# Patient Record
Sex: Male | Born: 1950 | Race: White | Hispanic: No | Marital: Married | Smoking: Never smoker
Health system: Southern US, Community
[De-identification: ages and names within clinical notes are randomized; demographics above are authoritative.]

## PROBLEM LIST (undated history)

## (undated) DIAGNOSIS — J45909 Unspecified asthma, uncomplicated: Secondary | ICD-10-CM

## (undated) DIAGNOSIS — C801 Malignant (primary) neoplasm, unspecified: Secondary | ICD-10-CM

## (undated) HISTORY — PX: BACK SURGERY: SHX140

## (undated) HISTORY — PX: TONSILLECTOMY: SUR1361

## (undated) HISTORY — PX: EYE SURGERY: SHX253

## (undated) HISTORY — PX: SKIN BIOPSY: SHX1

---

## 2001-06-20 ENCOUNTER — Encounter: Payer: Self-pay | Admitting: Surgery

## 2001-06-20 ENCOUNTER — Emergency Department (HOSPITAL_COMMUNITY): Admission: EM | Admit: 2001-06-20 | Discharge: 2001-06-20 | Payer: Self-pay | Admitting: Emergency Medicine

## 2001-08-22 ENCOUNTER — Ambulatory Visit (HOSPITAL_COMMUNITY): Admission: RE | Admit: 2001-08-22 | Discharge: 2001-08-22 | Payer: Self-pay | Admitting: Gastroenterology

## 2001-11-12 ENCOUNTER — Encounter: Payer: Self-pay | Admitting: Gastroenterology

## 2001-11-12 ENCOUNTER — Ambulatory Visit (HOSPITAL_COMMUNITY): Admission: RE | Admit: 2001-11-12 | Discharge: 2001-11-12 | Payer: Self-pay | Admitting: Gastroenterology

## 2019-08-03 ENCOUNTER — Ambulatory Visit: Payer: Medicare HMO | Attending: Internal Medicine

## 2019-08-03 DIAGNOSIS — Z23 Encounter for immunization: Secondary | ICD-10-CM | POA: Insufficient documentation

## 2019-08-03 NOTE — Progress Notes (Signed)
   Covid-19 Vaccination Clinic  Name:  Gary Young    MRN: [id number] DOB: 02/05/1951  08/03/2019  Mr. Gary Young was observed post Covid-19 immunization for 30 minutes based on pre-vaccination screening without incidence. He was provided with Vaccine Information Sheet and instruction to access the V-Safe system.   Mr. Gary Young was instructed to call 911 with any severe reactions post vaccine: [name] Kitchen Difficulty breathing  . Swelling of your face and throat  . A fast heartbeat  . A bad rash all over your body  . Dizziness and weakness    Immunizations Administered    Name Date Dose VIS Date Route   Pfizer COVID-19 Vaccine 08/03/2019 10:17 AM 0.3 mL 05/24/2019 Intramuscular   Manufacturer: [place]   Lot: [id number]   [place]: [id number]

## 2019-08-26 ENCOUNTER — Ambulatory Visit: Payer: Medicare HMO | Attending: Internal Medicine

## 2019-08-26 DIAGNOSIS — Z23 Encounter for immunization: Secondary | ICD-10-CM

## 2019-08-26 NOTE — Progress Notes (Signed)
   Covid-19 Vaccination Clinic  Name:  Gary Young    MRN: [id number] DOB: Jun 17, 1950  08/26/2019  [name] was observed post Covid-19 immunization for 15 minutes without incident. He was provided with Vaccine Information Sheet and instruction to access the V-Safe system.   [name] was instructed to call 911 with any severe reactions post vaccine: [name] Kitchen Difficulty breathing  . Swelling of face and throat  . A fast heartbeat  . A bad rash all over body  . Dizziness and weakness   Immunizations Administered    Name Date Dose VIS Date Route   Pfizer COVID-19 Vaccine 08/26/2019  8:42 AM 0.3 mL 05/24/2019 Intramuscular   Manufacturer: [place]   Lot: [id number]   [place]: [id number]

## 2020-10-13 ENCOUNTER — Other Ambulatory Visit: Payer: Self-pay

## 2020-10-13 ENCOUNTER — Encounter (HOSPITAL_BASED_OUTPATIENT_CLINIC_OR_DEPARTMENT_OTHER): Payer: Self-pay

## 2020-10-13 ENCOUNTER — Emergency Department (HOSPITAL_BASED_OUTPATIENT_CLINIC_OR_DEPARTMENT_OTHER): Payer: Medicare HMO | Admitting: Radiology

## 2020-10-13 ENCOUNTER — Emergency Department (HOSPITAL_BASED_OUTPATIENT_CLINIC_OR_DEPARTMENT_OTHER)
Admission: EM | Admit: 2020-10-13 | Discharge: 2020-10-13 | Disposition: A | Discharge status: Home / Self Care | Payer: Medicare HMO | Attending: Emergency Medicine | Admitting: Emergency Medicine

## 2020-10-13 DIAGNOSIS — M25551 Pain in right hip: Secondary | ICD-10-CM | POA: Diagnosis not present

## 2020-10-13 DIAGNOSIS — R000 Tachycardia, unspecified: Secondary | ICD-10-CM | POA: Diagnosis not present

## 2020-10-13 IMAGING — DX DG HIP (WITH OR WITHOUT PELVIS) 2-3V*R*
3 series · 3 of 3 positions shown · non-contrast
Comparison: None.

CLINICAL DATA: Right hip pain

EXAM:
DG HIP (WITH OR WITHOUT PELVIS) 2-3V RIGHT

[pelvis ap]
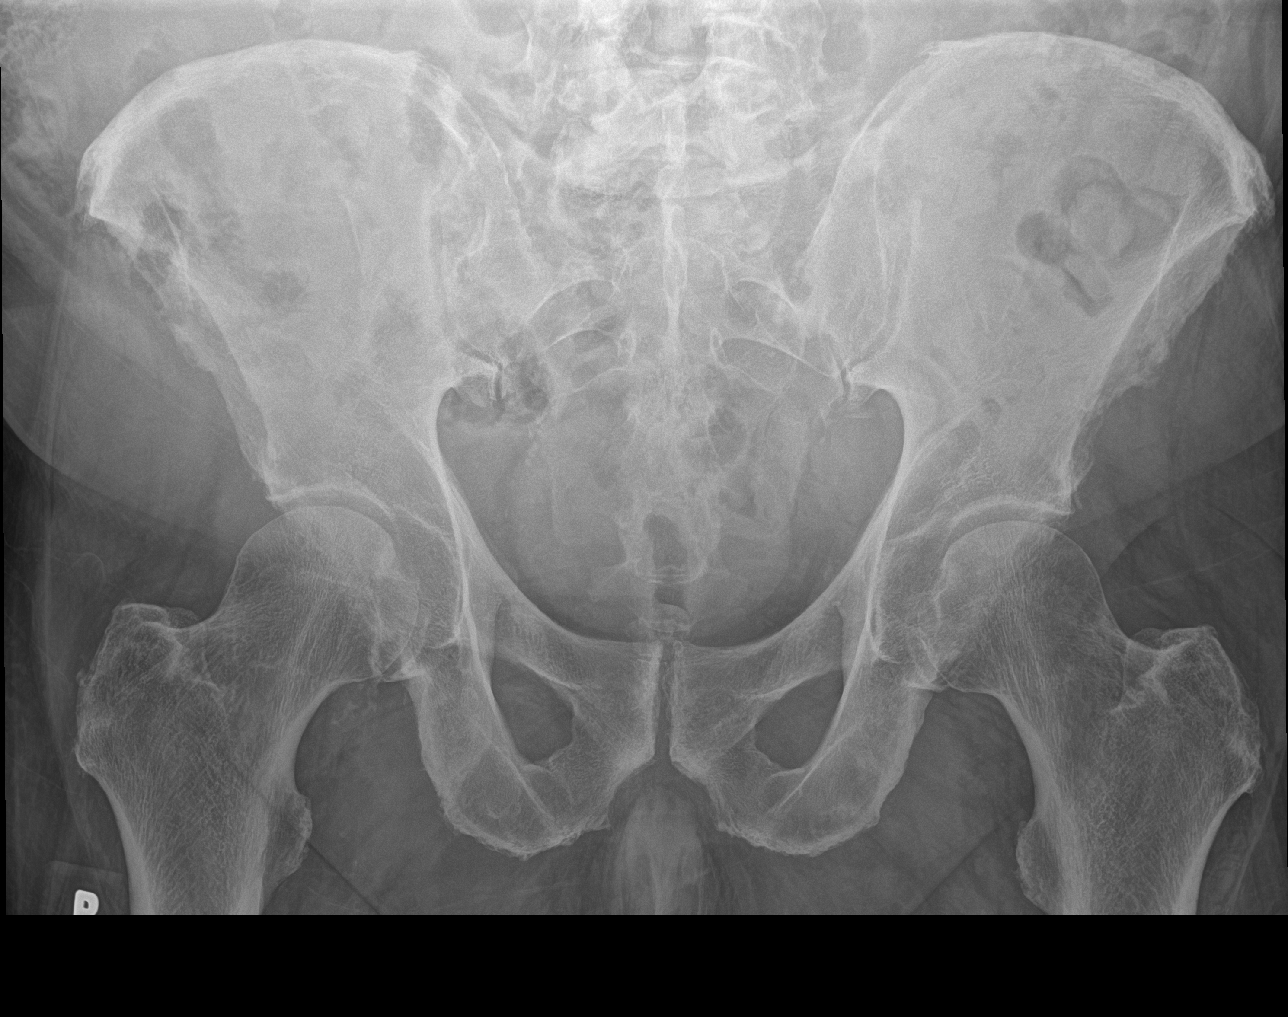

[hip ap]
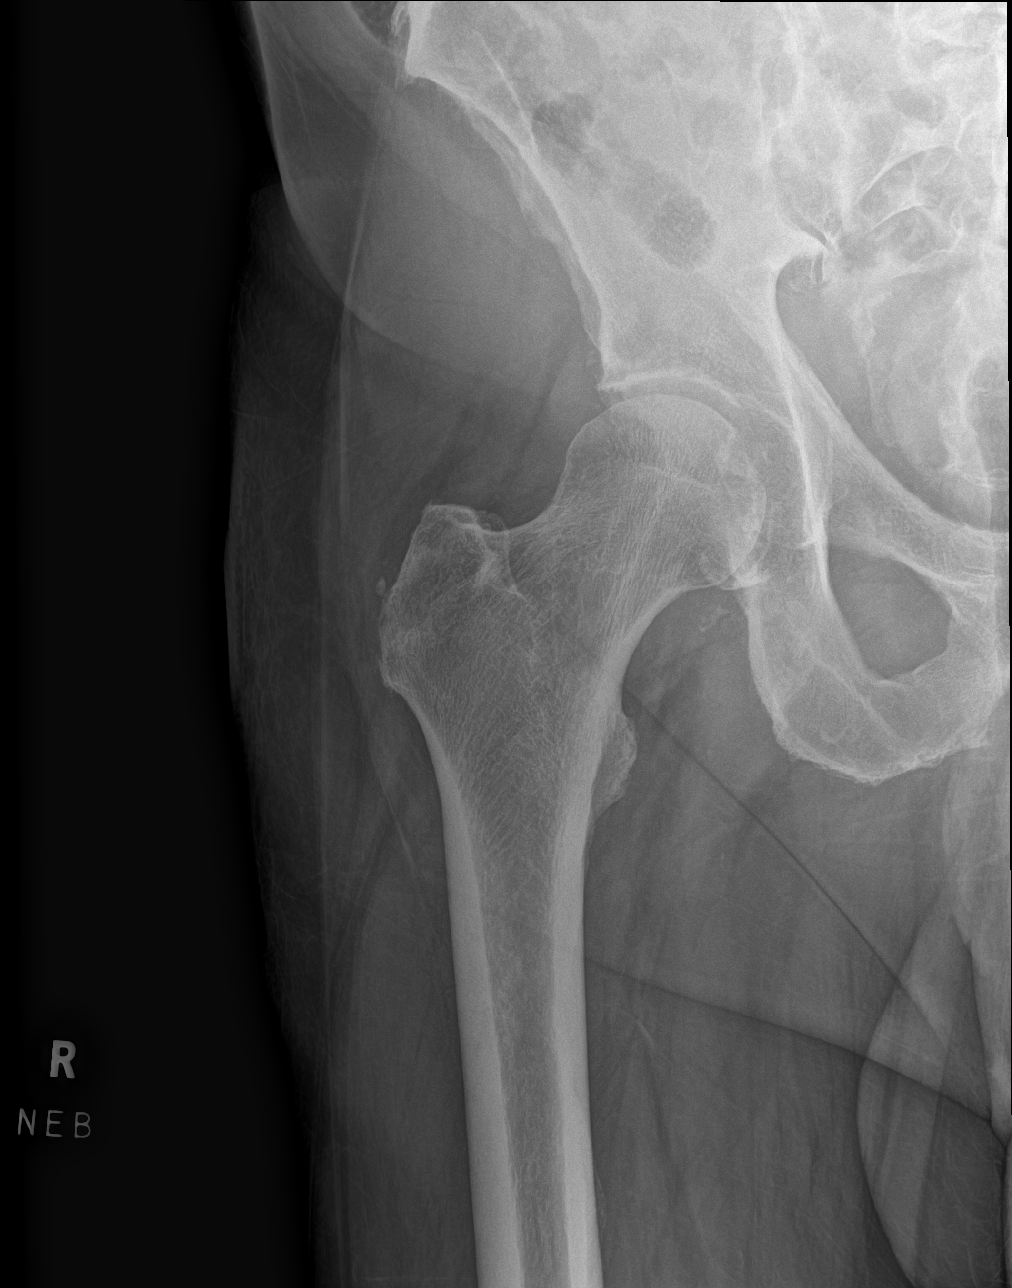

[hip lat]
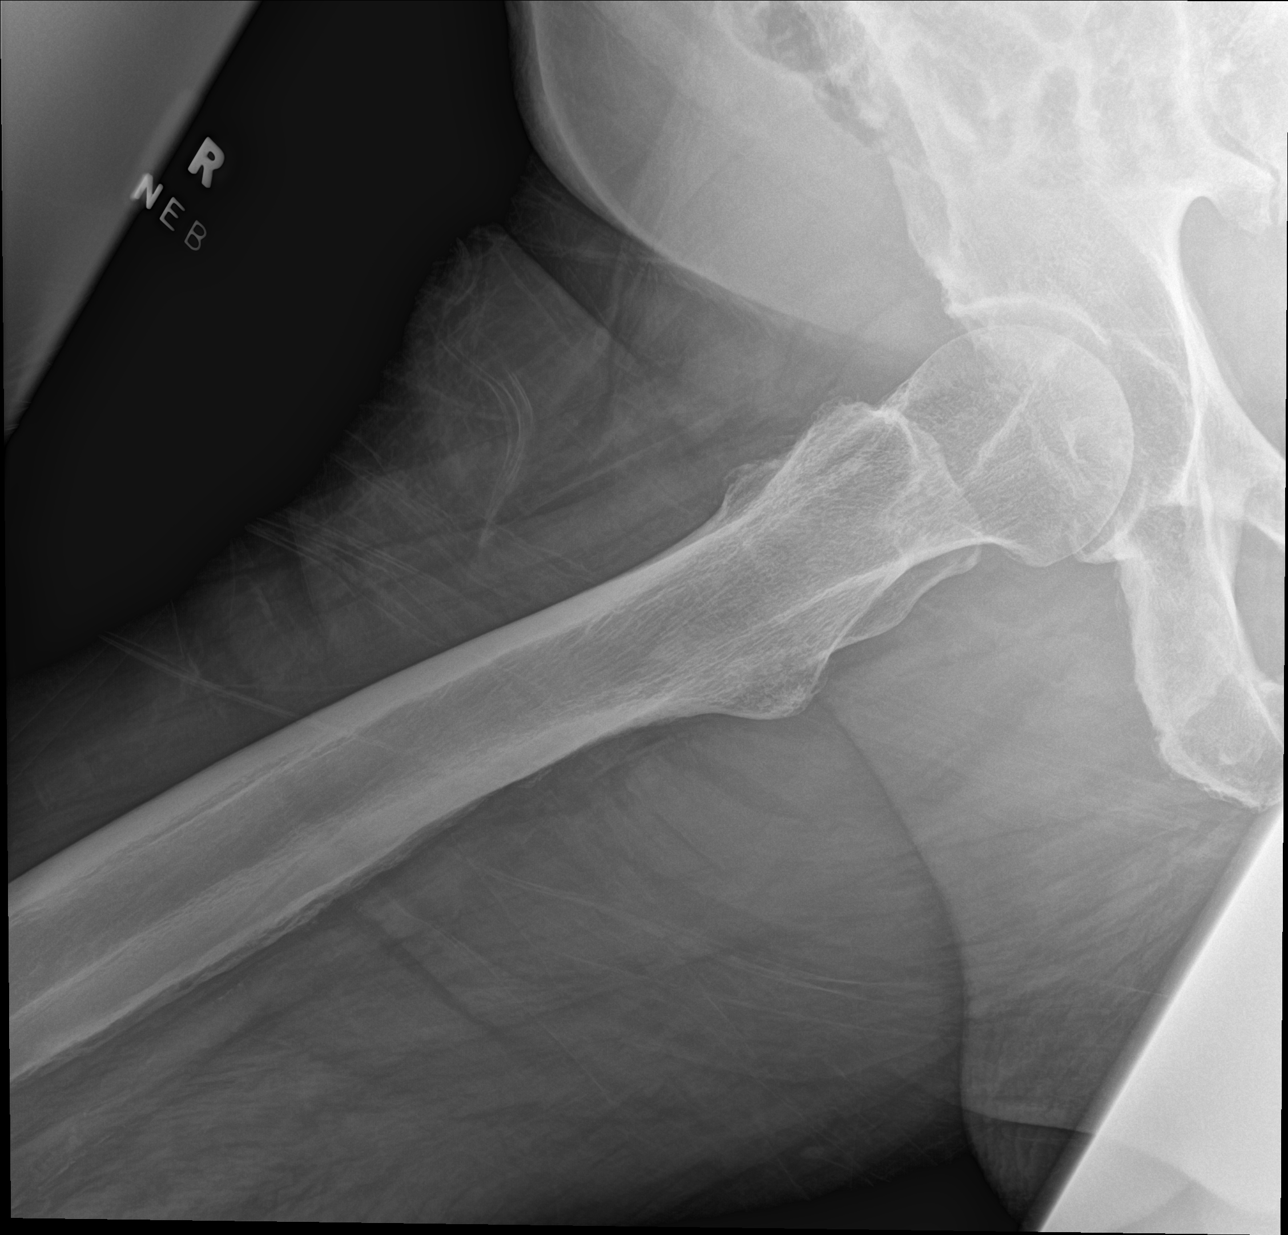

[3 of 3 positions shown; findings below may reference images not displayed]

FINDINGS: There is no evidence of hip fracture or dislocation. There is no
evidence of arthropathy or other focal bone abnormality.
IMPRESSION: Negative.

## 2020-10-13 NOTE — ED Provider Notes (Signed)
[facility] EMERGENCY DEPT Provider Note   CSN: [id number] Arrival date & time: 10/13/20  2013     History Chief Complaint  Patient presents with  . Groin Pain    Left    Breaker Springer is a 70 y.o. male.  The history is provided by the patient.  Groin Pain This is a new problem. Episode onset: 2 weeks. The problem occurs constantly. The problem has been gradually worsening. Associated symptoms comments: Pain in the right hip/groin area after bending over and cutting grass 2 weeks ago.  No abdominal pain.  No testicular pain or swelling.  No urinary problems.  No numbness that goes down the leg or weakness.  No falls.. The symptoms are aggravated by walking. The symptoms are relieved by rest and acetaminophen. He has tried acetaminophen for the symptoms. The treatment provided moderate relief.       History reviewed. No pertinent past medical history.  There are no problems to display for this patient.   History reviewed. No pertinent surgical history.     No family history on file.  Social History   Tobacco Use  . Smoking status: Never Smoker  . Smokeless tobacco: Never Used  Substance Use Topics  . Alcohol use: Yes    Comment: Socially    Home Medications Prior to Admission medications   Not on File    Allergies    Patient has no allergy information on record.  Review of Systems   Review of Systems  Hematological:       Patient had been recently diagnosed with anemia.  He reports his doctor is handling it and he is recently started taking iron.  He had noted that he had been tired all the time.  All other systems reviewed and are negative.   Physical Exam Updated Vital Signs BP 107/80 (BP Location: Left Arm)   Pulse (!) 110   Temp 98.4 F (36.9 C) (Oral)   Resp 16   Ht 6' (1.829 m)   Wt 110.7 kg   SpO2 100%   BMI 33.09 kg/m   Physical Exam Vitals and nursing note reviewed.  Constitutional:      General: He is not in acute  distress.    Appearance: He is well-developed. He is obese.  HENT:     Head: Normocephalic and atraumatic.  Eyes:     Conjunctiva/sclera: Conjunctivae normal.     Pupils: Pupils are equal, round, and reactive to light.  Cardiovascular:     Rate and Rhythm: Tachycardia present.     Heart sounds: No murmur heard.   Pulmonary:     Effort: Pulmonary effort is normal. No respiratory distress.  Abdominal:     General: There is no distension.     Palpations: Abdomen is soft.     Tenderness: There is no abdominal tenderness. There is no guarding or rebound.  Genitourinary:    Testes: Normal.  Musculoskeletal:        General: No tenderness or signs of injury. Normal range of motion.     Cervical back: Neck supple.     Right hip: Bony tenderness present. Normal range of motion.     Comments: Pain with standing on the right hip.  No reproducible pain with flexion, extension, internal or external rotation of the hip.  Skin:    General: Skin is warm and dry.     Findings: No erythema or rash.  Neurological:     Mental Status: He is alert and oriented  to person, place, and time. Mental status is at baseline.     Sensory: No sensory deficit.     Motor: No weakness.  Psychiatric:        Mood and Affect: Mood normal.        Behavior: Behavior normal.     ED Results / Procedures / Treatments   Labs (all labs ordered are listed, but only abnormal results are displayed) Labs Reviewed - No data to display  EKG None  Radiology No results found.  Procedures Procedures   Medications Ordered in ED Medications - No data to display  ED Course  I have reviewed the triage vital signs and the nursing notes.  Pertinent labs & imaging results that were available during my care of the patient were reviewed by me and considered in my medical decision making (see chart for details).    MDM Rules/Calculators/A&P                          Patient is a 70 year old male presenting today with  worsening groin pain.  He states the pain in his hip has been present for the last 2 weeks after vigorous activity doing stuff in his yard with his grass.  Symptoms do improve with Tylenol but today he went for a colonoscopy preevaluation and had to walk all over the building.  When he got home he noted a lot more pain in his right hip.  He was planning on getting an x-ray tomorrow but because of the pain worsening his wife insisted that he come to the emergency room.  Patient reports after taking the Tylenol when he got home the pain is now improved.  On exam he does have a mild limping gait but no pain reproduced with range of motion of the hip.  He has no notable swelling bulging or evidence of inguinal hernia.  He has no testicular involvement.  No signs of cellulitis and low suspicion for lumbar radiculopathy as patient is otherwise neurologically intact.  Plain film neg for acute findings.  Pt d/ced home with outpt f/u.  MDM Number of Diagnoses or Management Options   Amount and/or Complexity of Data Reviewed Tests in the radiology section of CPT: ordered and reviewed Independent visualization of images, tracings, or specimens: yes    Final Clinical Impression(s) / ED Diagnoses Final diagnoses:  Right hip pain    Rx / DC Orders ED Discharge Orders    None       Blanchie Dessert, MD 10/13/20 2159

## 2020-10-13 NOTE — Discharge Instructions (Signed)
The x-rays look normal today.  You can continue to do 2 extra strength Tylenol at the maximum 4 times a day.  You can also try Voltaren gel.

## 2020-10-13 NOTE — ED Triage Notes (Addendum)
Patient here POV from Home with L. Sided Groin Pain for approx 1 week.   Patient doesn't remember any specific Trauma then besides some mild straining while cutting grass.  Pain has been present since and has been worsening.   Ambulatory but Painful.

## 2020-10-27 ENCOUNTER — Encounter: Payer: Self-pay | Admitting: Hematology & Oncology

## 2020-10-27 ENCOUNTER — Inpatient Hospital Stay: Payer: Medicare HMO

## 2020-10-27 ENCOUNTER — Inpatient Hospital Stay (HOSPITAL_BASED_OUTPATIENT_CLINIC_OR_DEPARTMENT_OTHER): Payer: Medicare HMO | Admitting: Hematology & Oncology

## 2020-10-27 ENCOUNTER — Other Ambulatory Visit: Payer: Self-pay

## 2020-10-27 ENCOUNTER — Other Ambulatory Visit: Payer: Self-pay | Admitting: *Deleted

## 2020-10-27 ENCOUNTER — Inpatient Hospital Stay: Payer: Medicare HMO | Attending: Hematology & Oncology

## 2020-10-27 VITALS — BP 114/64 | HR 99 | Temp 98.0°F | Resp 18 | Ht 72.0 in | Wt 232.0 lb

## 2020-10-27 DIAGNOSIS — C786 Secondary malignant neoplasm of retroperitoneum and peritoneum: Secondary | ICD-10-CM | POA: Diagnosis not present

## 2020-10-27 DIAGNOSIS — R160 Hepatomegaly, not elsewhere classified: Secondary | ICD-10-CM | POA: Diagnosis not present

## 2020-10-27 DIAGNOSIS — C439 Malignant melanoma of skin, unspecified: Secondary | ICD-10-CM | POA: Insufficient documentation

## 2020-10-27 DIAGNOSIS — G893 Neoplasm related pain (acute) (chronic): Secondary | ICD-10-CM | POA: Diagnosis not present

## 2020-10-27 DIAGNOSIS — C7800 Secondary malignant neoplasm of unspecified lung: Secondary | ICD-10-CM | POA: Insufficient documentation

## 2020-10-27 DIAGNOSIS — C787 Secondary malignant neoplasm of liver and intrahepatic bile duct: Secondary | ICD-10-CM | POA: Diagnosis not present

## 2020-10-27 LAB — CBC WITH DIFFERENTIAL (CANCER CENTER ONLY)
Abs Immature Granulocytes: 0.1 10*3/uL — ABNORMAL HIGH (ref 0.00–0.07)
Basophils Absolute: 0.1 10*3/uL (ref 0.0–0.1)
Basophils Relative: 1 %
Eosinophils Absolute: 0.2 10*3/uL (ref 0.0–0.5)
Eosinophils Relative: 2 %
HCT: 30 % — ABNORMAL LOW (ref 39.0–52.0)
Hemoglobin: 9.5 g/dL — ABNORMAL LOW (ref 13.0–17.0)
Immature Granulocytes: 1 %
Lymphocytes Relative: 12 %
Lymphs Abs: 1.5 10*3/uL (ref 0.7–4.0)
MCH: 26.6 pg (ref 26.0–34.0)
MCHC: 31.7 g/dL (ref 30.0–36.0)
MCV: 84 fL (ref 80.0–100.0)
Monocytes Absolute: 1.4 10*3/uL — ABNORMAL HIGH (ref 0.1–1.0)
Monocytes Relative: 11 %
Neutro Abs: 9.2 10*3/uL — ABNORMAL HIGH (ref 1.7–7.7)
Neutrophils Relative %: 73 %
Platelet Count: 383 10*3/uL (ref 150–400)
RBC: 3.57 MIL/uL — ABNORMAL LOW (ref 4.22–5.81)
RDW: 14.5 % (ref 11.5–15.5)
WBC Count: 12.5 10*3/uL — ABNORMAL HIGH (ref 4.0–10.5)
nRBC: 0 % (ref 0.0–0.2)

## 2020-10-27 LAB — CMP (CANCER CENTER ONLY)
ALT: 16 U/L (ref 0–44)
AST: 18 U/L (ref 15–41)
Albumin: 3.9 g/dL (ref 3.5–5.0)
Alkaline Phosphatase: 97 U/L (ref 38–126)
Anion gap: 10 (ref 5–15)
BUN: 28 mg/dL — ABNORMAL HIGH (ref 8–23)
CO2: 28 mmol/L (ref 22–32)
Calcium: 9.9 mg/dL (ref 8.9–10.3)
Chloride: 94 mmol/L — ABNORMAL LOW (ref 98–111)
Creatinine: 0.86 mg/dL (ref 0.61–1.24)
GFR, Estimated: >60 mL/min (ref >60)
Glucose, Bld: 130 mg/dL — ABNORMAL HIGH (ref 70–99)
Potassium: 4.6 mmol/L (ref 3.5–5.1)
Sodium: 132 mmol/L — ABNORMAL LOW (ref 135–145)
Total Bilirubin: 0.4 mg/dL (ref 0.3–1.2)
Total Protein: 6.1 g/dL — ABNORMAL LOW (ref 6.5–8.1)

## 2020-10-27 LAB — SAVE SMEAR(SSMR), FOR PROVIDER SLIDE REVIEW

## 2020-10-27 LAB — LACTATE DEHYDROGENASE: LDH: 449 U/L — ABNORMAL HIGH (ref 98–192)

## 2020-10-27 LAB — PREALBUMIN: Prealbumin: 10.9 mg/dL — ABNORMAL LOW (ref 18–38)

## 2020-10-27 MED ORDER — HYDROXYZINE HCL 25 MG PO TABS: 25.0000 mg | ORAL_TABLET | Freq: Three times a day (TID) | ORAL | 0 refills | Status: AC | PRN

## 2020-10-27 MED ORDER — HYDROMORPHONE HCL 4 MG/ML IJ SOLN
INTRAMUSCULAR | Status: AC
  Filled 2020-10-27: qty 1

## 2020-10-27 MED ORDER — FENTANYL 25 MCG/HR TD PT72: 1.0000 | MEDICATED_PATCH | TRANSDERMAL | 0 refills | Status: DC

## 2020-10-27 MED ORDER — HYDROMORPHONE HCL 4 MG/ML IJ SOLN
2.0000 mg | Freq: Once | INTRAMUSCULAR | Status: AC
  Administered 2020-10-27: 2 mg via SUBCUTANEOUS

## 2020-10-27 MED ORDER — HYDROMORPHONE HCL 1 MG/ML IJ SOLN: 2.0000 mg | Freq: Once | INTRAMUSCULAR | Status: DC

## 2020-10-27 NOTE — Patient Instructions (Signed)
Hydromorphone injection What is this medicine? HYDROMORPHONE (hye droe MOR fone) is a pain reliever. It is used to treat moderate to severe pain. This medicine may be used for other purposes; ask your health care provider or pharmacist if you have questions. COMMON BRAND NAME(S): Dilaudid, Dilaudid-HP, Simplist Dilaudid What should I tell my health care provider before I take this medicine? They need to know if you have any of these conditions:  brain tumor  drug abuse or addiction  head injury  heart disease  if you often drink alcohol  kidney disease  liver disease  lung or breathing disease, like asthma  problems urinating  seizures  stomach or intestine problems  an unusual or allergic reaction to hydromorphone, other medicines, foods, dyes, or preservatives  pregnant or trying to get pregnant  breast-feeding How should I use this medicine? This medicine is for injection into a vein, into a muscle, or under the skin. It is usually given by a health care professional in a hospital or clinic setting. In rare cases, you might get this medicine at home. You will be taught how to give this medicine. Use exactly as directed. Take your medicine at regular intervals. Do not take your medicine more often than directed. It is important that you put your used needles and syringes in a special sharps container. Do not put them in a trash can. If you do not have a sharps container, call your pharmacist or healthcare provider to get one. Talk to your pediatrician regarding the use of this medicine in children. Special care may be needed. Overdosage: If you think you have taken too much of this medicine contact a poison control center or emergency room at once. NOTE: This medicine is only for you. Do not share this medicine with others. What if I miss a dose? If you miss a dose, use it as soon as you can. If it is almost time for your next dose, use only that dose. Do not use double  or extra doses. What may interact with this medicine? This medicine may interact with the following medications:  alcohol  antihistamines for allergy, cough and cold  certain medicines for anxiety or sleep  certain medicines for depression like amitriptyline, fluoxetine, sertraline  certain medicines for seizures like phenobarbital, primidone  general anesthetics like halothane, isoflurane, methoxyflurane, propofol  local anesthetics like lidocaine, pramoxine, tetracaine  MAOIs like Carbex, Eldepryl, Marplan, Nardil, and Parnate  medicines that relax muscles for surgery  other narcotic medicines for pain or cough  phenothiazines like chlorpromazine, mesoridazine, prochlorperazine, thioridazine This list may not describe all possible interactions. Give your health care provider a list of all the medicines, herbs, non-prescription drugs, or dietary supplements you use. Also tell them if you smoke, drink alcohol, or use illegal drugs. Some items may interact with your medicine. What should I watch for while using this medicine? Tell your health care provider if your pain does not go away, if it gets worse, or if you have new or a different type of pain. You may develop tolerance to this drug. Tolerance means that you will need a higher dose of the drug for pain relief. Tolerance is normal and is expected if you take this drug for a long time. There are different types of narcotic drugs (opioids) for pain. If you take more than one type at the same time, you may have more side effects. Give your health care provider a list of all drugs you use. He or   she will tell you how much drug to take. Do not take more drug than directed. Get emergency help right away if you have problems breathing. Do not suddenly stop taking your drug because you may develop a severe reaction. Your body becomes used to the drug. This does NOT mean you are addicted. Addiction is a behavior related to getting and using  a drug for a nonmedical reason. If you have pain, you have a medical reason to take pain drug. Your health care provider will tell you how much drug to take. If your health care provider wants you to stop the drug, the dose will be slowly lowered over time to avoid any side effects. Talk to your health care provider about naloxone and how to get it. Naloxone is an emergency drug used for an opioid overdose. An overdose can happen if you take too much opioid. It can also happen if an opioid is taken with some other drugs or substances, like alcohol. Know the symptoms of an overdose, like trouble breathing, unusually tired or sleepy, or not being able to respond or wake up. Make sure to tell caregivers and close contacts where it is stored. Make sure they know how to use it. After naloxone is given, you must get emergency help right away. Naloxone is a temporary treatment. Repeat doses may be needed. You may get drowsy or dizzy. Do not drive, use machinery, or do anything that needs mental alertness until you know how this drug affects you. Do not stand up or sit up quickly, especially if you are an older patient. This reduces the risk of dizzy or fainting spells. Alcohol may interfere with the effect of this drug. Avoid alcoholic drinks. This drug will cause constipation. If you do not have a bowel movement for 3 days, call your health care provider. Your mouth may get dry. Chewing sugarless gum or sucking hard candy and drinking plenty of water may help. Contact your health care provider if the problem does not go away or is severe. What side effects may I notice from receiving this medicine? Side effects that you should report to your doctor or health care professional as soon as possible:  allergic reactions like skin rash, itching or hives, swelling of the face, lips, or tongue  breathing problems  confusion  seizures  signs and symptoms of low blood pressure like dizziness; feeling faint or  lightheaded, falls; unusually weak or tired  trouble passing urine or change in the amount of urine Side effects that usually do not require medical attention (report to your doctor or health care professional if they continue or are bothersome):  constipation  dry mouth  nausea, vomiting  tiredness This list may not describe all possible side effects. Call your doctor for medical advice about side effects. You may report side effects to FDA at 1-800-FDA-1088. Where should I keep my medicine? Keep out of the reach of children. This medicine can be abused. Keep your medicine in a safe place to protect it from theft. Do not share this medicine with anyone. Selling or giving away this medicine is dangerous and against the law. If you are using this medicine at home, you will be instructed on how to store this medicine. This medicine may cause accidental overdose and death if it is taken by other adults, children, or pets. Flush any unused medicine down the toilet to reduce the chance of harm. Do not use the medicine after the expiration date. NOTE: This sheet   is a summary. It may not cover all possible information. If you have questions about this medicine, talk to your doctor, pharmacist, or health care provider.  2021 Elsevier/Gold Standard (2019-01-07 11:27:33)  

## 2020-10-27 NOTE — Progress Notes (Signed)
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  Chief Complaint  Patient presents with  . New Patient (Initial Visit)  : I just hurt in my hips.  HPI: Gary Young is a very nice 70-year-old white male.  He is originally from [place].  He comes in with his wife.  He met her in [place].  They have been in [place] for many years.  He was a painter.  I am sure he has some occupational exposures.  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  This was done at [facility].  From the operative report, this was a stage IIC (T4bN0M0) melanoma.  He did not require any adjuvant therapy.  Recently, he began to have some pain mostly in his pelvic area.  He had no problems with bowels or bladder.  He had a decreased appetite because of pain.  He had no count of leg swelling.  There is no pain down his legs.  Ultimately, he had a CT scan done.  This was done on 10/24/2020.  Surprisingly, the CT scan showed extensive metastatic disease.  He had a left lower lobe lesion measuring 3.6 x 4.2 cm.  He had bilateral pulmonary nodules.  Had innumerable by lobar hepatic metastasis.  The largest was in the right hepatic lobe measuring 4.9 x 5.8 cm.  A second lesion measured 3.9 x 4.4 cm.  He had retroperitoneal masses.  He had soft tissue mass along the right psoas muscle.  There was some permeative changes in the right ilium.  There are lytic lesions involving the thoracolumbar spine and bony pelvis.  He was then kindly referred to the [facility] for evaluation.  He he has not smoked for many years.  He does not drink.  He has had no cough.  There is no bleeding.  He is had no rashes.  He has had no headache.  I would have to say that currently, his performance status is ECOG 1.   History reviewed. No pertinent past medical history.:  History  reviewed. No pertinent surgical history.:   Current Outpatient Medications:  .  Cholecalciferol (VITAMIN D) 50 MCG (2000 UT) tablet, Take 2,000 Units by mouth daily., Disp: , Rfl:  .  cyanocobalamin 1000 MCG tablet, Take by mouth., Disp: , Rfl:  .  cyclobenzaprine (FLEXERIL) 10 MG tablet, Take 1 tablet by mouth 3 (three) times daily as needed., Disp: , Rfl:  .  doxazosin (CARDURA) 8 MG tablet, TAKE 1 TABLET EVERY DAY TO IMPROVE BLADDER FUNCTION, Disp: , Rfl:  .  fentaNYL (DURAGESIC) 25 MCG/HR, Place 1 patch onto the skin every 3 (three) days., Disp: 10 patch, Rfl: 0 .  ferrous sulfate 325 (65 FE) MG tablet, Take by mouth., Disp: , Rfl:  .  latanoprost (XALATAN) 0.005 % ophthalmic solution, INSTILL 1 DROP INTO LEFT EYE NIGHTLY (DISCARD AND BEGIN A NEW BOTTLE AFTER 42 DAYS), Disp: , Rfl:  .  timolol (TIMOPTIC) 0.5 % ophthalmic solution, INSTILL 1 DROP INTO BOTH EYES EVERY DAY, Disp: , Rfl:  .  traMADol (ULTRAM) 50 MG tablet, Take 50-100 mg by mouth 3 (three) times daily as needed., Disp: , Rfl:  .  albuterol (VENTOLIN HFA) 108 (90 Base) MCG/ACT inhaler, Inhale into the lungs. (Patient not taking: Reported on 10/27/2020), Disp: , Rfl:  .  albuterol (VENTOLIN HFA) 108 (90 Base) MCG/ACT inhaler, Inhale into   the lungs. (Patient not taking: Reported on 10/27/2020), Disp: , Rfl:  .  hydrOXYzine (ATARAX/VISTARIL) 25 MG tablet, Take 1 tablet (25 mg total) by mouth 3 (three) times daily as needed., Disp: 30 tablet, Rfl: 0:  :  No Known Allergies:  History reviewed. No pertinent family history.:  Social History   Socioeconomic History  . Marital status: Married    Spouse name: Not on file  . Number of children: Not on file  . Years of education: Not on file  . Highest education level: Not on file  Occupational History  . Not on file  Tobacco Use  . Smoking status: Never Smoker  . Smokeless tobacco: Never Used  Vaping Use  . Vaping Use: Never used  Substance and Sexual Activity  . Alcohol  use: Yes    Comment: Socially  . Drug use: Not on file  . Sexual activity: Not on file  Other Topics Concern  . Not on file  Social History Narrative  . Not on file   Social Determinants of Health   Financial Resource Strain: Not on file  Food Insecurity: Not on file  Transportation Needs: Not on file  Physical Activity: Not on file  Stress: Not on file  Social Connections: Not on file  Intimate Partner Violence: Not on file  :  Review of Systems  Constitutional: Positive for malaise/fatigue.  HENT: Negative.   Eyes: Negative.   Respiratory: Negative.   Cardiovascular: Negative.   Gastrointestinal: Positive for abdominal pain.  Genitourinary: Negative.   Musculoskeletal: Positive for joint pain and myalgias.  Skin: Negative.   Neurological: Negative.   Endo/Heme/Allergies: Negative.   Psychiatric/Behavioral: Negative.      Exam:  This is a fairly well-developed and well-nourished white male in no obvious distress.  Vital signs are temperature of 98.  Pulse 90.  Blood pressure 114/64.  Weight is 232 pounds.  Head and neck exam shows no ocular or oral lesions.  He has no adenopathy in the neck.  In the right upper neck, there is a lesion.  It measures about a centimeter.  It is firm and erythematous.  It is nontender.  Lungs are clear to percussion and auscultation bilaterally.  Cardiac exam regular rate and rhythm with no murmurs, rubs or bruits.  Abdomen is soft.  He has decent bowel sounds.  There is no fluid wave.  There is no obvious hepatosplenomegaly.  Back exam shows a wide local excision from his melanoma in the left lower back.  There is no warmth or erythema in this area.  Extremities shows some trace edema in his legs bilaterally.  Neurological exam shows no focal neurological deficits.  Skin exam shows no suspicious lesions.    @IPVITALS@   Recent Labs    10/27/20 1146  WBC 12.5*  HGB 9.5*  HCT 30.0*  PLT 383   Recent Labs    10/27/20 1146  NA 132*   K 4.6  CL 94*  CO2 28  GLUCOSE 130*  BUN 28*  CREATININE 0.86  CALCIUM 9.9    Blood smear review: None  Pathology: None    Assessment and Plan: Gary Young is a very nice 70-year-old white male.  He clearly has a significant problem.  He has widely metastatic disease.  Again, I really have to believe that this is going to end up being melanoma.  He had a melanoma surgery about 6 years ago.  It was a deep melanoma.  I realize there are no positive   lymph nodes but yet it was quite deep.  We clearly go to have to get a biopsy.  I think that a liver biopsy would be the way to go right now.  I realize that he has this lesion in the right upper neck.  I am not sure if this is a ingrown hair follicle or if this might be a metastatic lesion.  I think that the liver lesions would be relatively easy to obtain.  Again, if this is not feasible, I would then see about having this nodule on the right neck removed.  I think the real key here is going to be whether or not this has a genetic mutation.  If we are dealing with melanoma, I would like to hope that we are looking at a melanoma with a BRAF mutation.  He will also need to have a PET scan done.  I think this will be very important for [place].  He is going to need a MRI of the brain.  He says he has had some unusual smells.  I worry that he might have something going on with his brain.  This is quite complex.  He needs to have some better pain control.  I think he needs time-released pain medication.  I will go ahead and order some Duragesic patches for him.  I will try a 25 mcg patch.  Hopefully this will help with some of the discomfort.  I am sure that he probably is going to need radiation therapy depending on what we find with our PET scan and also with the pathology.  I spent a good hour or so with he and his wife.  They are both very very nice.  He still is in good shape.  I think we can be aggressive here.  Once we get all the results  back from our pathology and our radiographic studies then we will get him back to the office.

## 2020-10-28 ENCOUNTER — Encounter: Payer: Self-pay | Admitting: *Deleted

## 2020-10-28 ENCOUNTER — Telehealth: Payer: Self-pay

## 2020-10-28 LAB — CEA (IN HOUSE-CHCC): CEA (CHCC-In House): <1 ng/mL (ref 0.00–5.00)

## 2020-10-28 LAB — IRON AND TIBC
Iron: 12 ug/dL — ABNORMAL LOW (ref 42–163)
Saturation Ratios: 3 % — ABNORMAL LOW (ref 20–55)
TIBC: 360 ug/dL (ref 202–409)
UIBC: 348 ug/dL (ref 117–376)

## 2020-10-28 LAB — FERRITIN: Ferritin: 232 ng/mL (ref 24–336)

## 2020-10-28 LAB — CANCER ANTIGEN 19-9: CA 19-9: 74 U/mL — ABNORMAL HIGH (ref 0–35)

## 2020-10-28 NOTE — Telephone Encounter (Signed)
No 10/27/20 LOS   Jeri Rawlins

## 2020-10-28 NOTE — Progress Notes (Signed)
Navigator not in the office for patient's new patient appointment.   Patient needs to have a biopsy, MRI and PET scan.  MRI and PET scheduled for 11/06/2020. Biopsy is in review stage. Will continue to follow for scheduling.   Oncology Nurse Navigator Documentation  Oncology Nurse Navigator Flowsheets 10/28/2020  Abnormal Finding Date 10/24/2020  Confirmed Diagnosis Date 10/26/2020  Diagnosis Status Additional Work Up  Navigator Follow Up Date: 11/06/2020  Navigator Follow Up Reason: Scan Review  Navigator Location [facility]  Referral Date to RadOnc/MedOnc 10/26/2020  Navigator Encounter Type Appt/Treatment Plan Review  Patient Visit Type MedOnc  Treatment Phase Abnormal Scans  Barriers/Navigation Needs Coordination of Care  Interventions Coordination of Care  Acuity Level 2-Minimal Needs (1-2 Barriers Identified)  Coordination of Care Radiology  Support Groups/Services Friends and Family  Time Spent with Patient 30

## 2020-10-29 ENCOUNTER — Encounter: Payer: Self-pay | Admitting: *Deleted

## 2020-10-29 NOTE — Progress Notes (Signed)
Reached out to IR scheduling regarding the scheduling of patient's liver biopsy. At this time his scheduling will be held until after his PET and MRI.   Called patient and notified him of their plan. Will update Dr Marin Olp on Monday when he returns to the office.   Oncology Nurse Navigator Documentation  Oncology Nurse Navigator Flowsheets 10/29/2020  Abnormal Finding Date -  Confirmed Diagnosis Date -  Diagnosis Status -  Navigator Follow Up Date: 11/06/2020  Navigator Follow Up Reason: Scan Review  Navigator Location [facility]  Referral Date to RadOnc/MedOnc -  Navigator Encounter Type Appt/Treatment Plan Review  Patient Visit Type MedOnc  Treatment Phase Abnormal Scans  Barriers/Navigation Needs Coordination of Care  Interventions Coordination of Care;Education;Psycho-Social Support  Acuity Level 2-Minimal Needs (1-2 Barriers Identified)  Coordination of Care Radiology  Education Method Verbal  Support Groups/Services Friends and Family  Time Spent with Patient 30

## 2020-11-02 ENCOUNTER — Other Ambulatory Visit: Payer: Self-pay | Admitting: *Deleted

## 2020-11-02 ENCOUNTER — Telehealth: Payer: Self-pay | Admitting: *Deleted

## 2020-11-02 ENCOUNTER — Encounter: Payer: Self-pay | Admitting: *Deleted

## 2020-11-02 MED ORDER — FENTANYL 50 MCG/HR TD PT72: 1.0000 | MEDICATED_PATCH | TRANSDERMAL | 0 refills | Status: DC

## 2020-11-02 NOTE — Telephone Encounter (Signed)
Received call from [facility] patients wife stating that patient is still having pain even after 1 week of Fentanyl 25 mcg patch.  Dr Marin Olp notified and increased patch to 50 mcg. Every 3 days.  Also said patient can take Tramadol 100 mg 4 times daily if needed for pain control.  Wife understands instructions and appreciates call

## 2020-11-02 NOTE — Telephone Encounter (Signed)
Received a call from wife [name]. Patient has been on Fentanyl patch x 1 week without relief.  Dr Marin Olp notified.  Increased pain patch to 50 mcg.  Rx sent to pharmacy.  Dr Marin Olp also said patient can take Tramadol 100 mg 4 times daily if needed for pain.  Wife understands instructions.  Appreciates call

## 2020-11-02 NOTE — Progress Notes (Signed)
Spoke to Dr Marin Olp about IR not scheduling biopsy until PET/MRI. Dr Marin Olp request follow up regarding this decision and when talking to IR it was determined that IR was unaware that the patient had scans at an outside facility.  Called [facility] and spoke to [facility] to request that CT AP be pushed to [place] [phone].  Called [facility] at days end and scans had still not yet been pushed through to [employer]. Will continue to follow.   Oncology Nurse Navigator Documentation  Oncology Nurse Navigator Flowsheets 11/02/2020  Abnormal Finding Date -  Confirmed Diagnosis Date -  Diagnosis Status -  Navigator Follow Up Date: 11/03/2020  Navigator Follow Up Reason: Radiology  Navigator Location [facility]  Referral Date to RadOnc/MedOnc -  Navigator Encounter Type Scan Review  Patient Visit Type MedOnc  Treatment Phase Abnormal Scans  Barriers/Navigation Needs Coordination of Care  Interventions Coordination of Care  Acuity Level 2-Minimal Needs (1-2 Barriers Identified)  Coordination of Care Radiology  Education Method -  Support Groups/Services Friends and Family  Time Spent with Patient 102

## 2020-11-03 ENCOUNTER — Encounter: Payer: Self-pay | Admitting: *Deleted

## 2020-11-03 ENCOUNTER — Ambulatory Visit
Admission: RE | Admit: 2020-11-03 | Discharge: 2020-11-03 | Disposition: A | Discharge status: Home / Self Care | Payer: Self-pay | Source: Ambulatory Visit | Attending: Hematology & Oncology | Admitting: Hematology & Oncology

## 2020-11-03 DIAGNOSIS — R160 Hepatomegaly, not elsewhere classified: Secondary | ICD-10-CM

## 2020-11-03 NOTE — Progress Notes (Signed)
Images pushed through and now visible in our system. Message sent to biopsy scheduling notifying them that CT is now available for review. They will send to physician for biopsy planning.  Notified patient of current progress and expected timeline. Will continue to follow for scheduling.   Oncology Nurse Navigator Documentation  Oncology Nurse Navigator Flowsheets 11/03/2020  Abnormal Finding Date -  Confirmed Diagnosis Date -  Diagnosis Status -  Navigator Follow Up Date: 11/05/2020  Navigator Follow Up Reason: Appointment Review  Navigator Location [facility]  Referral Date to RadOnc/MedOnc -  Navigator Encounter Type Scan Review;Telephone  Telephone Appt Confirmation/Clarification;Outgoing Call  Patient Visit Type MedOnc  Treatment Phase Abnormal Scans  Barriers/Navigation Needs Coordination of Care;Education  Education Other  Interventions Coordination of Care;Education  Acuity Level 2-Minimal Needs (1-2 Barriers Identified)  Coordination of Care Radiology  Education Method Verbal  Support Groups/Services Friends and Family  Time Spent with Patient 47

## 2020-11-04 ENCOUNTER — Encounter: Payer: Self-pay | Admitting: *Deleted

## 2020-11-04 ENCOUNTER — Other Ambulatory Visit: Payer: Self-pay

## 2020-11-04 ENCOUNTER — Inpatient Hospital Stay (HOSPITAL_COMMUNITY)
Admission: EM | Admit: 2020-11-04 | Discharge: 2020-11-13 | DRG: 356 | Disposition: A | Discharge status: Home Health | Payer: Medicare HMO | Attending: Internal Medicine | Admitting: Internal Medicine

## 2020-11-04 ENCOUNTER — Emergency Department (HOSPITAL_COMMUNITY): Payer: Medicare HMO

## 2020-11-04 ENCOUNTER — Encounter (HOSPITAL_COMMUNITY): Payer: Self-pay | Admitting: Emergency Medicine

## 2020-11-04 DIAGNOSIS — C7951 Secondary malignant neoplasm of bone: Secondary | ICD-10-CM

## 2020-11-04 DIAGNOSIS — R52 Pain, unspecified: Secondary | ICD-10-CM | POA: Diagnosis not present

## 2020-11-04 DIAGNOSIS — M79641 Pain in right hand: Secondary | ICD-10-CM | POA: Diagnosis present

## 2020-11-04 DIAGNOSIS — C7931 Secondary malignant neoplasm of brain: Secondary | ICD-10-CM | POA: Diagnosis present

## 2020-11-04 DIAGNOSIS — K2970 Gastritis, unspecified, without bleeding: Secondary | ICD-10-CM | POA: Diagnosis present

## 2020-11-04 DIAGNOSIS — F419 Anxiety disorder, unspecified: Secondary | ICD-10-CM

## 2020-11-04 DIAGNOSIS — C786 Secondary malignant neoplasm of retroperitoneum and peritoneum: Secondary | ICD-10-CM | POA: Diagnosis present

## 2020-11-04 DIAGNOSIS — M79661 Pain in right lower leg: Secondary | ICD-10-CM | POA: Diagnosis present

## 2020-11-04 DIAGNOSIS — Z85820 Personal history of malignant melanoma of skin: Secondary | ICD-10-CM

## 2020-11-04 DIAGNOSIS — G936 Cerebral edema: Secondary | ICD-10-CM | POA: Diagnosis present

## 2020-11-04 DIAGNOSIS — Z20822 Contact with and (suspected) exposure to covid-19: Secondary | ICD-10-CM | POA: Diagnosis present

## 2020-11-04 DIAGNOSIS — E440 Moderate protein-calorie malnutrition: Secondary | ICD-10-CM | POA: Diagnosis present

## 2020-11-04 DIAGNOSIS — C438 Malignant melanoma of overlapping sites of skin: Secondary | ICD-10-CM

## 2020-11-04 DIAGNOSIS — Z808 Family history of malignant neoplasm of other organs or systems: Secondary | ICD-10-CM

## 2020-11-04 DIAGNOSIS — Z515 Encounter for palliative care: Secondary | ICD-10-CM

## 2020-11-04 DIAGNOSIS — T380X5A Adverse effect of glucocorticoids and synthetic analogues, initial encounter: Secondary | ICD-10-CM | POA: Diagnosis present

## 2020-11-04 DIAGNOSIS — J45909 Unspecified asthma, uncomplicated: Secondary | ICD-10-CM | POA: Diagnosis present

## 2020-11-04 DIAGNOSIS — M25552 Pain in left hip: Secondary | ICD-10-CM | POA: Diagnosis present

## 2020-11-04 DIAGNOSIS — C7802 Secondary malignant neoplasm of left lung: Secondary | ICD-10-CM | POA: Diagnosis present

## 2020-11-04 DIAGNOSIS — K259 Gastric ulcer, unspecified as acute or chronic, without hemorrhage or perforation: Secondary | ICD-10-CM | POA: Diagnosis not present

## 2020-11-04 DIAGNOSIS — G893 Neoplasm related pain (acute) (chronic): Secondary | ICD-10-CM | POA: Diagnosis present

## 2020-11-04 DIAGNOSIS — C787 Secondary malignant neoplasm of liver and intrahepatic bile duct: Secondary | ICD-10-CM | POA: Diagnosis present

## 2020-11-04 DIAGNOSIS — R221 Localized swelling, mass and lump, neck: Secondary | ICD-10-CM

## 2020-11-04 DIAGNOSIS — C439 Malignant melanoma of skin, unspecified: Secondary | ICD-10-CM

## 2020-11-04 DIAGNOSIS — E669 Obesity, unspecified: Secondary | ICD-10-CM | POA: Diagnosis present

## 2020-11-04 DIAGNOSIS — E861 Hypovolemia: Secondary | ICD-10-CM | POA: Diagnosis present

## 2020-11-04 DIAGNOSIS — K317 Polyp of stomach and duodenum: Secondary | ICD-10-CM | POA: Diagnosis present

## 2020-11-04 DIAGNOSIS — M79642 Pain in left hand: Secondary | ICD-10-CM | POA: Diagnosis present

## 2020-11-04 DIAGNOSIS — D72829 Elevated white blood cell count, unspecified: Secondary | ICD-10-CM | POA: Diagnosis present

## 2020-11-04 DIAGNOSIS — Z7189 Other specified counseling: Secondary | ICD-10-CM

## 2020-11-04 DIAGNOSIS — Z6831 Body mass index (BMI) 31.0-31.9, adult: Secondary | ICD-10-CM

## 2020-11-04 DIAGNOSIS — D509 Iron deficiency anemia, unspecified: Secondary | ICD-10-CM | POA: Diagnosis present

## 2020-11-04 DIAGNOSIS — M25511 Pain in right shoulder: Secondary | ICD-10-CM | POA: Diagnosis present

## 2020-11-04 DIAGNOSIS — R160 Hepatomegaly, not elsewhere classified: Secondary | ICD-10-CM

## 2020-11-04 DIAGNOSIS — Z79899 Other long term (current) drug therapy: Secondary | ICD-10-CM

## 2020-11-04 DIAGNOSIS — R195 Other fecal abnormalities: Secondary | ICD-10-CM | POA: Diagnosis present

## 2020-11-04 DIAGNOSIS — D630 Anemia in neoplastic disease: Secondary | ICD-10-CM | POA: Diagnosis present

## 2020-11-04 DIAGNOSIS — Z66 Do not resuscitate: Secondary | ICD-10-CM | POA: Diagnosis present

## 2020-11-04 DIAGNOSIS — E871 Hypo-osmolality and hyponatremia: Secondary | ICD-10-CM | POA: Diagnosis present

## 2020-11-04 HISTORY — DX: Malignant (primary) neoplasm, unspecified: C80.1

## 2020-11-04 HISTORY — DX: Unspecified asthma, uncomplicated: J45.909

## 2020-11-04 LAB — COMPREHENSIVE METABOLIC PANEL
ALT: 26 U/L (ref 0–44)
AST: 30 U/L (ref 15–41)
Albumin: 2.8 g/dL — ABNORMAL LOW (ref 3.5–5.0)
Alkaline Phosphatase: 109 U/L (ref 38–126)
Anion gap: 10 (ref 5–15)
BUN: 21 mg/dL (ref 8–23)
CO2: 28 mmol/L (ref 22–32)
Calcium: 9.2 mg/dL (ref 8.9–10.3)
Chloride: 93 mmol/L — ABNORMAL LOW (ref 98–111)
Creatinine, Ser: 0.8 mg/dL (ref 0.61–1.24)
GFR, Estimated: >60 mL/min (ref >60)
Glucose, Bld: 133 mg/dL — ABNORMAL HIGH (ref 70–99)
Potassium: 4.4 mmol/L (ref 3.5–5.1)
Sodium: 131 mmol/L — ABNORMAL LOW (ref 135–145)
Total Bilirubin: 0.6 mg/dL (ref 0.3–1.2)
Total Protein: 5.9 g/dL — ABNORMAL LOW (ref 6.5–8.1)

## 2020-11-04 LAB — CBC WITH DIFFERENTIAL/PLATELET
Abs Immature Granulocytes: 0.11 10*3/uL — ABNORMAL HIGH (ref 0.00–0.07)
Basophils Absolute: 0.1 10*3/uL (ref 0.0–0.1)
Basophils Relative: 1 %
Eosinophils Absolute: 0.4 10*3/uL (ref 0.0–0.5)
Eosinophils Relative: 3 %
HCT: 29.9 % — ABNORMAL LOW (ref 39.0–52.0)
Hemoglobin: 9.2 g/dL — ABNORMAL LOW (ref 13.0–17.0)
Immature Granulocytes: 1 %
Lymphocytes Relative: 7 %
Lymphs Abs: 0.9 10*3/uL (ref 0.7–4.0)
MCH: 25.3 pg — ABNORMAL LOW (ref 26.0–34.0)
MCHC: 30.8 g/dL (ref 30.0–36.0)
MCV: 82.4 fL (ref 80.0–100.0)
Monocytes Absolute: 1.4 10*3/uL — ABNORMAL HIGH (ref 0.1–1.0)
Monocytes Relative: 11 %
Neutro Abs: 10 10*3/uL — ABNORMAL HIGH (ref 1.7–7.7)
Neutrophils Relative %: 77 %
Platelets: 409 10*3/uL — ABNORMAL HIGH (ref 150–400)
RBC: 3.63 MIL/uL — ABNORMAL LOW (ref 4.22–5.81)
RDW: 15.8 % — ABNORMAL HIGH (ref 11.5–15.5)
WBC: 12.8 10*3/uL — ABNORMAL HIGH (ref 4.0–10.5)
nRBC: 0 % (ref 0.0–0.2)

## 2020-11-04 LAB — URINALYSIS, ROUTINE W REFLEX MICROSCOPIC
Bilirubin Urine: NEGATIVE
Glucose, UA: NEGATIVE mg/dL
Hgb urine dipstick: NEGATIVE
Ketones, ur: 5 mg/dL — AB
Leukocytes,Ua: NEGATIVE
Nitrite: NEGATIVE
Protein, ur: NEGATIVE mg/dL
Specific Gravity, Urine: 1.026 (ref 1.005–1.030)
pH: 5 (ref 5.0–8.0)

## 2020-11-04 LAB — RESP PANEL BY RT-PCR (FLU A&B, COVID) ARPGX2
Influenza A by PCR: NEGATIVE
Influenza B by PCR: NEGATIVE
SARS Coronavirus 2 by RT PCR: NEGATIVE

## 2020-11-04 IMAGING — DX DG CHEST 1V PORT
1 series · 1 of 1 positions shown · non-contrast
Comparison: CT of the abdomen and pelvis from outside facility on

CLINICAL DATA: Shoulder pain, recent cancer diagnosis with RIGHT
shoulder pain.

EXAM:
PORTABLE CHEST 1 VIEW

[chest ap]
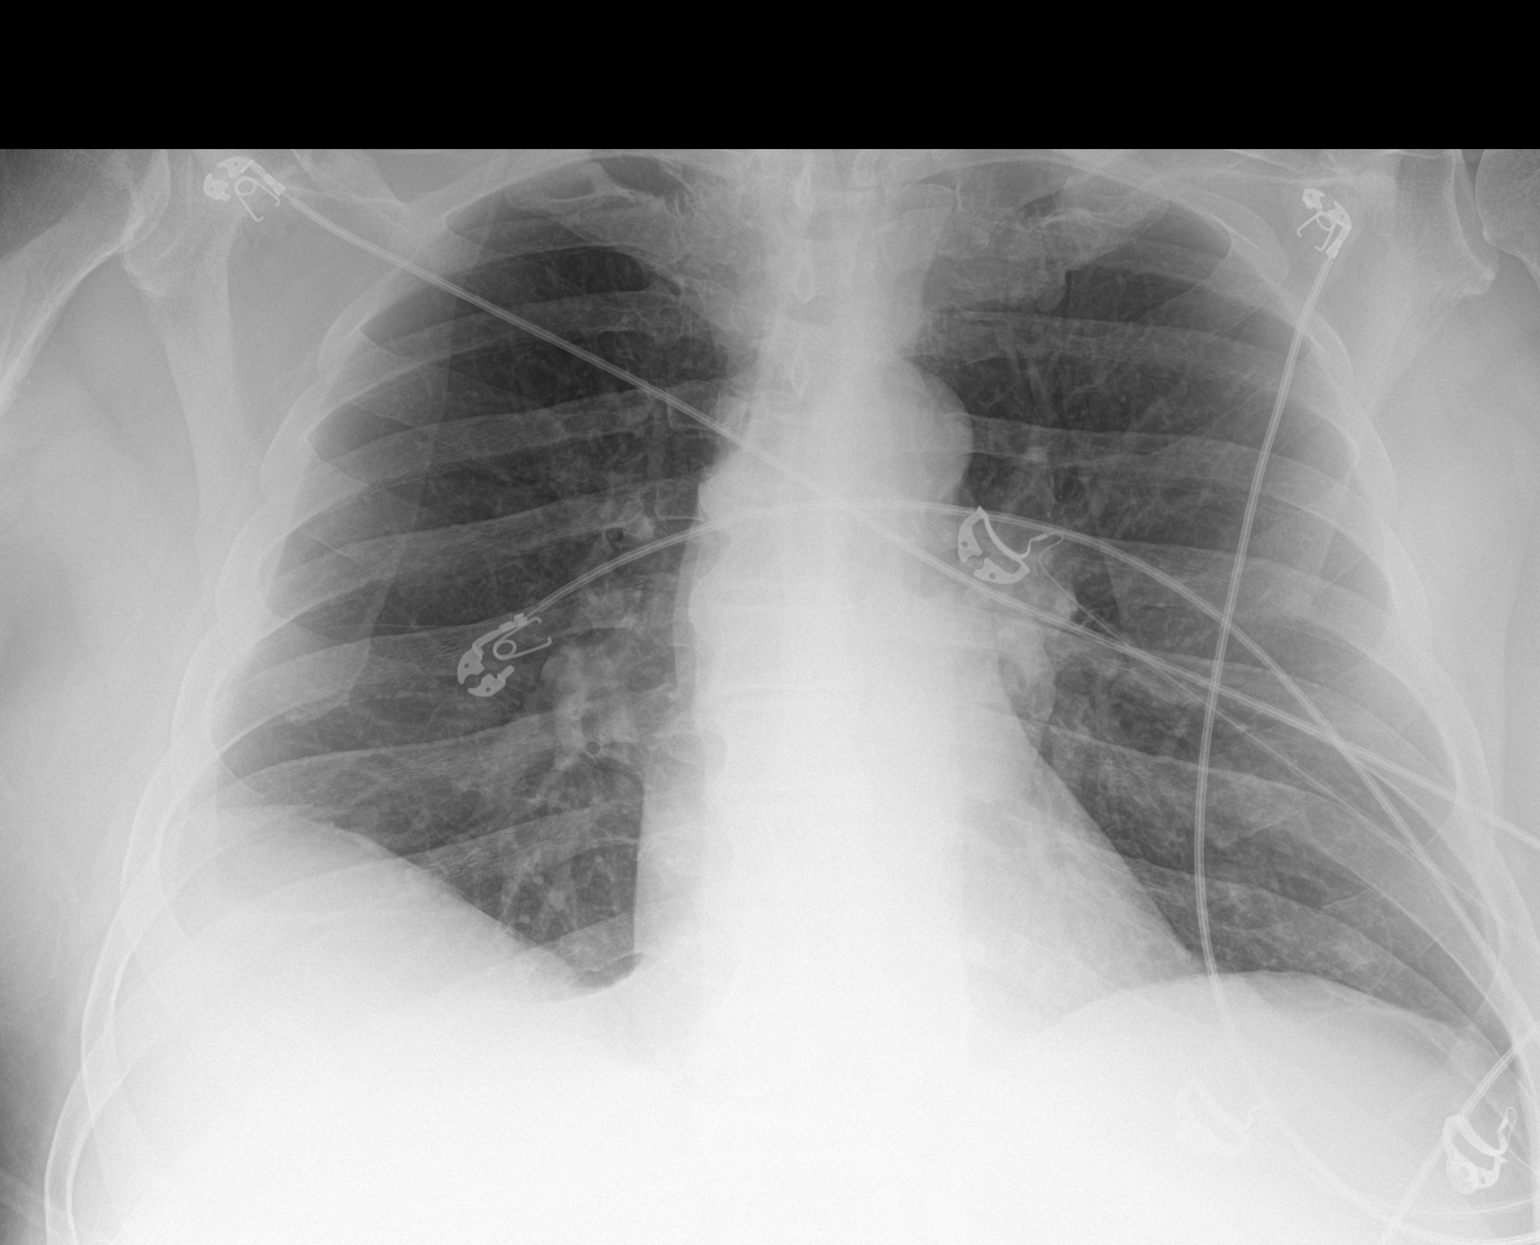

[1 of 1 positions shown; findings below may reference images not displayed]

FINDINGS: Trachea midline. Cardiomediastinal contours and hilar structures
with LEFT infrahilar lobulation in the setting of known LEFT lower
lobe mass.

Elevation of the RIGHT hemidiaphragm as before.

No lobar consolidation.  No gross pleural effusion.

Suspected small nodule at the LEFT lung base as well.

On limited assessment, no acute skeletal process.
IMPRESSION: 1. No acute cardiopulmonary disease in the setting of LEFT
infrahilar lobulation with known LEFT lower lobe mass.
2. Other small basilar nodules not well assessed.

## 2020-11-04 MED ORDER — DIPHENHYDRAMINE HCL 50 MG/ML IJ SOLN: 12.5000 mg | Freq: Four times a day (QID) | INTRAMUSCULAR | Status: DC | PRN

## 2020-11-04 MED ORDER — FERROUS SULFATE 325 (65 FE) MG PO TABS
325.0000 mg | ORAL_TABLET | Freq: Every day | ORAL | Status: DC
  Administered 2020-11-04 – 2020-11-06 (×3): 325 mg via ORAL
  Filled 2020-11-04 (×3): qty 1

## 2020-11-04 MED ORDER — LATANOPROST 0.005 % OP SOLN
1.0000 [drp] | Freq: Every day | OPHTHALMIC | Status: DC
  Administered 2020-11-04 – 2020-11-12 (×9): 1 [drp] via OPHTHALMIC
  Filled 2020-11-04: qty 2.5

## 2020-11-04 MED ORDER — ONDANSETRON HCL 4 MG/2ML IJ SOLN
4.0000 mg | Freq: Once | INTRAMUSCULAR | Status: AC
  Administered 2020-11-04: 4 mg via INTRAVENOUS
  Filled 2020-11-04: qty 2

## 2020-11-04 MED ORDER — NALOXONE HCL 0.4 MG/ML IJ SOLN: 0.4000 mg | INTRAMUSCULAR | Status: DC | PRN

## 2020-11-04 MED ORDER — SODIUM CHLORIDE 0.9% FLUSH: 9.0000 mL | INTRAVENOUS | Status: DC | PRN

## 2020-11-04 MED ORDER — MORPHINE SULFATE 1 MG/ML IV SOLN PCA
INTRAVENOUS | Status: DC
  Administered 2020-11-05 (×2): 4 mg via INTRAVENOUS
  Administered 2020-11-05: 8 mg via INTRAVENOUS
  Administered 2020-11-06: 4.8 mg via INTRAVENOUS
  Administered 2020-11-06: 5 mg via INTRAVENOUS
  Filled 2020-11-04 (×2): qty 30

## 2020-11-04 MED ORDER — ONDANSETRON HCL 4 MG/2ML IJ SOLN: 4.0000 mg | Freq: Four times a day (QID) | INTRAMUSCULAR | Status: DC | PRN

## 2020-11-04 MED ORDER — VITAMIN D3 25 MCG (1000 UNIT) PO TABS
2000.0000 [IU] | ORAL_TABLET | Freq: Every day | ORAL | Status: DC
  Administered 2020-11-05 – 2020-11-13 (×9): 2000 [IU] via ORAL
  Filled 2020-11-04 (×9): qty 2

## 2020-11-04 MED ORDER — ACETAMINOPHEN 325 MG PO TABS: 650.0000 mg | ORAL_TABLET | Freq: Four times a day (QID) | ORAL | Status: DC | PRN

## 2020-11-04 MED ORDER — DOXAZOSIN MESYLATE 8 MG PO TABS
8.0000 mg | ORAL_TABLET | Freq: Every evening | ORAL | Status: DC
  Administered 2020-11-04 – 2020-11-12 (×9): 8 mg via ORAL
  Filled 2020-11-04 (×10): qty 1

## 2020-11-04 MED ORDER — OXYCODONE HCL 5 MG PO TABS
5.0000 mg | ORAL_TABLET | Freq: Four times a day (QID) | ORAL | Status: DC | PRN
  Administered 2020-11-04 – 2020-11-06 (×2): 5 mg via ORAL
  Filled 2020-11-04 (×2): qty 1

## 2020-11-04 MED ORDER — ENOXAPARIN SODIUM 40 MG/0.4ML IJ SOSY
40.0000 mg | PREFILLED_SYRINGE | INTRAMUSCULAR | Status: DC
  Administered 2020-11-04: 40 mg via SUBCUTANEOUS
  Filled 2020-11-04: qty 0.4

## 2020-11-04 MED ORDER — SENNOSIDES-DOCUSATE SODIUM 8.6-50 MG PO TABS
2.0000 | ORAL_TABLET | Freq: Two times a day (BID) | ORAL | Status: DC
  Administered 2020-11-05 – 2020-11-13 (×17): 2 via ORAL
  Filled 2020-11-04 (×17): qty 2

## 2020-11-04 MED ORDER — VITAMIN B-12 1000 MCG PO TABS
1000.0000 ug | ORAL_TABLET | Freq: Every day | ORAL | Status: DC
  Administered 2020-11-05 – 2020-11-13 (×9): 1000 ug via ORAL
  Filled 2020-11-04 (×9): qty 1

## 2020-11-04 MED ORDER — METOPROLOL TARTRATE 5 MG/5ML IV SOLN
2.5000 mg | Freq: Four times a day (QID) | INTRAVENOUS | Status: DC | PRN
  Administered 2020-11-06 – 2020-11-09 (×2): 2.5 mg via INTRAVENOUS
  Filled 2020-11-04 (×3): qty 5

## 2020-11-04 MED ORDER — DIPHENHYDRAMINE HCL 12.5 MG/5ML PO ELIX
12.5000 mg | ORAL_SOLUTION | Freq: Four times a day (QID) | ORAL | Status: DC | PRN
  Administered 2020-11-04: 12.5 mg via ORAL
  Filled 2020-11-04: qty 5

## 2020-11-04 MED ORDER — TIMOLOL MALEATE 0.5 % OP SOLN
1.0000 [drp] | Freq: Every day | OPHTHALMIC | Status: DC
  Administered 2020-11-05 – 2020-11-13 (×9): 1 [drp] via OPHTHALMIC
  Filled 2020-11-04: qty 5

## 2020-11-04 MED ORDER — HYDROMORPHONE HCL 1 MG/ML IJ SOLN
0.5000 mg | INTRAMUSCULAR | Status: AC | PRN
  Administered 2020-11-04 (×3): 0.5 mg via INTRAVENOUS
  Filled 2020-11-04 (×3): qty 1

## 2020-11-04 MED ORDER — HYDROXYZINE HCL 25 MG PO TABS
25.0000 mg | ORAL_TABLET | Freq: Three times a day (TID) | ORAL | Status: DC | PRN
  Administered 2020-11-05 – 2020-11-07 (×3): 25 mg via ORAL
  Filled 2020-11-04 (×3): qty 1

## 2020-11-04 MED ORDER — LACTATED RINGERS IV SOLN: INTRAVENOUS | Status: DC

## 2020-11-04 NOTE — ED Notes (Signed)
ED TO INPATIENT HANDOFF REPORT  Name/Age/Gender Gary Young 70 y.o. male  Code Status    Code Status Orders  (From admission, onward)         Start     Ordered   11/04/20 1830  Do not attempt resuscitation (DNR)  Continuous       Question Answer Comment  In the event of cardiac or respiratory ARREST Do not call a "code blue"   In the event of cardiac or respiratory ARREST Do not perform Intubation, CPR, defibrillation or ACLS   In the event of cardiac or respiratory ARREST Use medication by any route, position, wound care, and other measures to relive pain and suffering. May use oxygen, suction and manual treatment of airway obstruction as needed for comfort.      11/04/20 1829        Code Status History    Date Active Date Inactive Code Status Order ID Comments User Context   11/04/2020 1728 11/04/2020 1829 Full Code [id number]  Kayleen Memos, DO ED   Advance Care Planning Activity    Advance Directive Documentation   Flowsheet Row Most Recent Value  Type of Advance Directive Living will, Healthcare Power of Attorney  Pre-existing out of facility DNR order (yellow form or pink MOST form) --  "MOST" Form in Place? --      Home/SNF/Other Home  Chief Complaint Intractable pain [R52]  Level of Care/Admitting Diagnosis ED Disposition    ED Disposition Condition [place] [place] [[id number]]  Level of Care: Telemetry [5]  Admit to tele based on following criteria: Monitor for Ischemic changes  Covid Evaluation: Asymptomatic Screening Protocol (No Symptoms)  Diagnosis: Intractable pain [782956]  Admitting Physician: Kayleen Memos [[id number]]  Attending Physician: Kayleen Memos [[id number]]       Medical History Past Medical History:  Diagnosis Date  . Asthma   . Cancer ([place])     Allergies No Known Allergies  IV Location/Drains/Wounds Patient Lines/Drains/Airways Status    Active Line/Drains/Airways    Name  Placement date Placement time Site Days   Peripheral IV 11/04/20 20 G Left Forearm 11/04/20  1549  Forearm  less than 1          Labs/Imaging Results for orders placed or performed during the hospital encounter of 11/04/20 (from the past 48 hour(s))  CBC with Differential/Platelet     Status: Abnormal   Collection Time: 11/04/20  3:47 PM  Result Value Ref Range   WBC 12.8 (H) 4.0 - 10.5 K/uL   RBC 3.63 (L) 4.22 - 5.81 MIL/uL   Hemoglobin 9.2 (L) 13.0 - 17.0 g/dL   HCT 29.9 (L) 39.0 - 52.0 %   MCV 82.4 80.0 - 100.0 fL   MCH 25.3 (L) 26.0 - 34.0 pg   MCHC 30.8 30.0 - 36.0 g/dL   RDW 15.8 (H) 11.5 - 15.5 %   Platelets 409 (H) 150 - 400 K/uL    Comment: REPEATED TO VERIFY   nRBC 0.0 0.0 - 0.2 %   Neutrophils Relative % 77 %   Neutro Abs 10.0 (H) 1.7 - 7.7 K/uL   Lymphocytes Relative 7 %   Lymphs Abs 0.9 0.7 - 4.0 K/uL   Monocytes Relative 11 %   Monocytes Absolute 1.4 (H) 0.1 - 1.0 K/uL   Eosinophils Relative 3 %   Eosinophils Absolute 0.4 0.0 - 0.5 K/uL   Basophils Relative 1 %   Basophils Absolute 0.1  0.0 - 0.1 K/uL   Immature Granulocytes 1 %   Abs Immature Granulocytes 0.11 (H) 0.00 - 0.07 K/uL    Comment: Performed at [facility], [place] [address], [place], [place] [address]  Comprehensive metabolic panel     Status: Abnormal   Collection Time: 11/04/20  3:47 PM  Result Value Ref Range   Sodium 131 (L) 135 - 145 mmol/L   Potassium 4.4 3.5 - 5.1 mmol/L   Chloride 93 (L) 98 - 111 mmol/L   CO2 28 22 - 32 mmol/L   Glucose, Bld 133 (H) 70 - 99 mg/dL    Comment: Glucose reference range applies only to samples taken after fasting for at least 8 hours.   BUN 21 8 - 23 mg/dL   Creatinine, Ser 0.80 0.61 - 1.24 mg/dL   Calcium 9.2 8.9 - 10.3 mg/dL   Total Protein 5.9 (L) 6.5 - 8.1 g/dL   Albumin 2.8 (L) 3.5 - 5.0 g/dL   AST 30 15 - 41 U/L   ALT 26 0 - 44 U/L   Alkaline Phosphatase 109 38 - 126 U/L   Total Bilirubin 0.6 0.3 - 1.2 mg/dL   GFR, Estimated >60  >60 mL/min    Comment: (NOTE) Calculated using the CKD-EPI Creatinine Equation (2021)    Anion gap 10 5 - 15    Comment: Performed at [facility], [place] [address] [place], [place] [address]  Resp Panel by RT-PCR (Flu A&B, Covid) Nasopharyngeal Swab     Status: None   Collection Time: 11/04/20  4:44 PM   Specimen: Nasopharyngeal Swab; Nasopharyngeal(NP) swabs in vial transport medium  Result Value Ref Range   SARS Coronavirus 2 by RT PCR NEGATIVE NEGATIVE    Comment: (NOTE) SARS-CoV-2 target nucleic acids are NOT DETECTED.  The SARS-CoV-2 RNA is generally detectable in upper respiratory specimens during the acute phase of infection. The lowest concentration of SARS-CoV-2 viral copies this assay can detect is 138 copies/mL. A negative result does not preclude SARS-Cov-2 infection and should not be used as the sole basis for treatment or other patient management decisions. A negative result may occur with  improper specimen collection/handling, submission of specimen other than nasopharyngeal swab, presence of viral mutation(s) within the areas targeted by this assay, and inadequate number of viral copies(<138 copies/mL). A negative result must be combined with clinical observations, patient history, and epidemiological information. The expected result is Negative.  Fact Sheet for Patients:  EntrepreneurPulse.com.au  Fact Sheet for Healthcare Providers:  IncredibleEmployment.be  This test is no t yet approved or cleared by the [place] FDA and  has been authorized for detection and/or diagnosis of SARS-CoV-2 by FDA under an Emergency Use Authorization (EUA). This EUA will remain  in effect (meaning this test can be used) for the duration of the COVID-19 declaration under Section 564(b)(1) of the Act, 21 U.S.C.section 360bbb-3(b)(1), unless the authorization is terminated  or revoked sooner.       Influenza A by PCR  NEGATIVE NEGATIVE   Influenza B by PCR NEGATIVE NEGATIVE    Comment: (NOTE) The Xpert Xpress SARS-CoV-2/FLU/RSV plus assay is intended as an aid in the diagnosis of influenza from Nasopharyngeal swab specimens and should not be used as a sole basis for treatment. Nasal washings and aspirates are unacceptable for Xpert Xpress SARS-CoV-2/FLU/RSV testing.  Fact Sheet for Patients: EntrepreneurPulse.com.au  Fact Sheet for Healthcare Providers: IncredibleEmployment.be  This test is not yet approved or cleared by the [place]  FDA and has been authorized for detection and/or diagnosis of SARS-CoV-2 by FDA under an Emergency Use Authorization (EUA). This EUA will remain in effect (meaning this test can be used) for the duration of the COVID-19 declaration under Section 564(b)(1) of the Act, 21 U.S.C. section 360bbb-3(b)(1), unless the authorization is terminated or revoked.  Performed at [facility], [place] [address] [place], [place] [address]    US SOFT TISSUE HEAD & NECK (NON-THYROID)  Result Date: 11/04/2020 CLINICAL DATA:  History of metastatic disease, now with palpable abnormality involving the right neck. EXAM: ULTRASOUND OF HEAD/NECK SOFT TISSUES TECHNIQUE: Ultrasound examination of the head and neck soft tissues was performed in the area of clinical concern. COMPARISON:  CT abdomen and pelvis-11/03/2020 FINDINGS: Sonographic evaluation of the patient's palpable area of concern involving the superior aspect of the right-side of the neck correlates with an approximately 1.8 x 1.7 x 1.3 cm mixed echogenic hypoechoic subcutaneous nodule. This nodule appears to abut the dermal surface. Otherwise, there is no sonographic correlate for patient's palpable area of concern. Specifically, no regional cervical lymphadenopathy. IMPRESSION: Patient's palpable area of concern involving the superior aspect of the right-side of the neck  correlates with an approximately 1.8 cm mixed echogenic subdermal subcutaneous nodule which given history of metastatic malignancy may represent either a subcutaneous metastasis (favored given presence of subcutaneous metastasis on abdominal CT performed 11/03/2020) versus a malignant appearing lymph node. Further evaluation with PET-CT imaging could be performed as indicated. Electronically Signed   By: Sandi Mariscal M.D.   On: 11/04/2020 16:24   DG Chest Port 1 View  Result Date: 11/04/2020 CLINICAL DATA:  Shoulder pain, recent cancer diagnosis with RIGHT shoulder pain. EXAM: PORTABLE CHEST 1 VIEW COMPARISON:  CT of the abdomen and pelvis from outside facility on Oct 24, 2020. FINDINGS: Trachea midline. Cardiomediastinal contours and hilar structures with LEFT infrahilar lobulation in the setting of known LEFT lower lobe mass. Elevation of the RIGHT hemidiaphragm as before. No lobar consolidation.  No gross pleural effusion. Suspected small nodule at the LEFT lung base as well. On limited assessment, no acute skeletal process. IMPRESSION: 1. No acute cardiopulmonary disease in the setting of LEFT infrahilar lobulation with known LEFT lower lobe mass. 2. Other small basilar nodules not well assessed. Electronically Signed   By: Zetta Bills M.D.   On: 11/04/2020 16:54    Pending Labs Unresulted Labs (From admission, onward)          Start     Ordered   11/11/20 0500  Creatinine, serum  (enoxaparin (LOVENOX)    CrCl >/= 30 ml/min)  Weekly,   R     Comments: while on enoxaparin therapy    11/04/20 1727   11/05/20 0500  CBC  (enoxaparin (LOVENOX)    CrCl >/= 30 ml/min)  Tomorrow morning,   R       Comments: Baseline for enoxaparin therapy IF NOT ALREADY DRAWN.  Notify MD if PLT < 100 K.    11/04/20 1740   11/05/20 0500  Comprehensive metabolic panel  Tomorrow morning,   R        11/04/20 1741   11/05/20 0500  Magnesium  Tomorrow morning,   R        11/04/20 1859   11/05/20 0500  Phosphorus   Tomorrow morning,   R        11/04/20 1859   11/04/20 1538  Urinalysis, Routine w reflex microscopic  Once,   STAT  11/04/20 1538          Vitals/Pain Today's Vitals   11/04/20 1830 11/04/20 1945 11/04/20 2020 11/04/20 2023  BP: 117/78 (!) 145/93  123/82  Pulse: (!) 103 (!) 103    Resp: 17 18    Temp:      TempSrc:      SpO2: 94% 92%    Weight:      Height:      PainSc:   7      Isolation Precautions No active isolations  Medications Medications  enoxaparin (LOVENOX) injection 40 mg (has no administration in time range)  acetaminophen (TYLENOL) tablet 650 mg (has no administration in time range)  senna-docusate (Senokot-S) tablet 2 tablet (2 tablets Oral Not Given 11/04/20 1825)  oxyCODONE (Oxy IR/ROXICODONE) immediate release tablet 5 mg (has no administration in time range)  naloxone (NARCAN) injection 0.4 mg (has no administration in time range)    And  sodium chloride flush (NS) 0.9 % injection 9 mL (has no administration in time range)  ondansetron (ZOFRAN) injection 4 mg (has no administration in time range)  diphenhydrAMINE (BENADRYL) injection 12.5 mg (has no administration in time range)    Or  diphenhydrAMINE (BENADRYL) 12.5 MG/5ML elixir 12.5 mg (has no administration in time range)  morphine 1 mg/mL PCA injection (has no administration in time range)  lactated ringers infusion ( Intravenous New Bag/Given 11/04/20 2026)  metoprolol tartrate (LOPRESSOR) injection 2.5 mg (has no administration in time range)  cholecalciferol (VITAMIN D) tablet 2,000 Units (has no administration in time range)  vitamin B-12 (CYANOCOBALAMIN) tablet 1,000 mcg (has no administration in time range)  doxazosin (CARDURA) tablet 8 mg (8 mg Oral Given 11/04/20 2023)  ferrous sulfate tablet 325 mg (325 mg Oral Given 11/04/20 2025)  hydrOXYzine (ATARAX/VISTARIL) tablet 25 mg (has no administration in time range)  timolol (TIMOPTIC) 0.5 % ophthalmic solution 1 drop (has no administration  in time range)  latanoprost (XALATAN) 0.005 % ophthalmic solution 1 drop (has no administration in time range)  HYDROmorphone (DILAUDID) injection 0.5 mg (0.5 mg Intravenous Given 11/04/20 2022)  ondansetron (ZOFRAN) injection 4 mg (4 mg Intravenous Given 11/04/20 1640)    Mobility walks with person assist

## 2020-11-04 NOTE — ED Notes (Signed)
Dr Nevada Crane was sent a secure message in regards to a duplicate order for CBC and creatinine

## 2020-11-04 NOTE — ED Provider Notes (Signed)
[facility] DEPT Provider Note   CSN: [id number] Arrival date & time: 11/04/20  1502     History Chief Complaint  Patient presents with  . Groin Pain    Gary Young is a 70 y.o. male.  Patient with history of melanoma that was treated with deep excision, reportedly negative lymph nodes --presents the emergency department today for worsening pain in his bilateral shoulders, bilateral groin, neck.  Unfortunately the patient began having groin pain earlier this month.  He was found to be anemic and a CT scan was ordered.  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  Patient was started on fentanyl patches for pain control and this was titrated upward by his oncologist, Dr. Marin Olp, to 50 mcg/hr patches every 3 days.  He is also on escalating doses of tramadol.  Every day his pain has gotten worse and he has become less functional at home.  Currently, with a walker, it takes him about an hour to get up and walk 10 feet to use the bathroom.  After he is back, he states his pain is at 15 out of 10.  Today he was not able to walk to the toilet and EMS was called for transport to the hospital as his wife would have been unable to take him.  He does not really describe weakness in the legs or other red flags such as saddle paresthesias, fecal incontinence, urinary retention.  Patient also has a nodule to the right side of the neck which is swollen, nontender.  It is not really changed over the past 3 weeks.  Since that time he has developed 2 small areas on the posterior scalp.  No reported fevers, nausea, vomiting, or diarrhea.  He is due to have a PET scan on 11/06/2020.  They are also trying to get him set up for a liver biopsy to establish the primary location of the cancer.  It was recommended that the patient come to the emergency department today, likely for admission for pain control, by Dr. Marin Olp.        Past Medical History:   Diagnosis Date  . Asthma   . Cancer ([place])     There are no problems to display for this patient.   Past Surgical History:  Procedure Laterality Date  . BACK SURGERY    . EYE SURGERY    . SKIN BIOPSY    . TONSILLECTOMY         History reviewed. No pertinent family history.  Social History   Tobacco Use  . Smoking status: Never Smoker  . Smokeless tobacco: Never Used  Vaping Use  . Vaping Use: Never used  Substance Use Topics  . Alcohol use: Yes    Alcohol/week: 2.0 standard drinks    Types: 1 Glasses of wine, 1 Cans of beer per week    Comment: Socially  . Drug use: Never    Home Medications Prior to Admission medications   Medication Sig Start Date End Date Taking? Authorizing Provider  albuterol (VENTOLIN HFA) 108 (90 Base) MCG/ACT inhaler Inhale into the lungs. Patient not taking: Reported on 10/27/2020 10/01/20   [provider]  albuterol (VENTOLIN HFA) 108 (90 Base) MCG/ACT inhaler Inhale into the lungs. Patient not taking: Reported on 10/27/2020 10/01/20   [provider]  Cholecalciferol (VITAMIN D) 50 MCG (2000 UT) tablet Take 2,000 Units by mouth daily.    [provider]  cyanocobalamin 1000 MCG tablet  Take by mouth.    [provider]  cyclobenzaprine (FLEXERIL) 10 MG tablet Take 1 tablet by mouth 3 (three) times daily as needed. 10/21/20   [provider]  doxazosin (CARDURA) 8 MG tablet TAKE 1 TABLET EVERY DAY TO IMPROVE BLADDER FUNCTION 02/06/20   [provider]  fentaNYL (DURAGESIC) 25 MCG/HR Place 1 patch onto the skin every 3 (three) days. 10/27/20   Volanda Napoleon, MD  fentaNYL (DURAGESIC) 50 MCG/HR Place 1 patch onto the skin every 3 (three) days. 11/02/20   Volanda Napoleon, MD  ferrous sulfate 325 (65 FE) MG tablet Take by mouth.    [provider]  hydrOXYzine (ATARAX/VISTARIL) 25 MG tablet Take 1 tablet (25 mg total) by mouth 3 (three) times daily as needed. 10/27/20   Volanda Napoleon,  MD  latanoprost (XALATAN) 0.005 % ophthalmic solution INSTILL 1 DROP INTO LEFT EYE NIGHTLY (DISCARD AND BEGIN A NEW BOTTLE AFTER 42 DAYS) 10/01/20   [provider]  timolol (TIMOPTIC) 0.5 % ophthalmic solution INSTILL 1 DROP INTO BOTH EYES EVERY DAY 01/29/20   [provider]  traMADol (ULTRAM) 50 MG tablet Take 50-100 mg by mouth 3 (three) times daily as needed. 10/26/20   [provider]    Allergies    Patient has no known allergies.  Review of Systems   Review of Systems  Constitutional: Negative for fever.  HENT: Negative for rhinorrhea and sore throat.   Eyes: Negative for redness.  Respiratory: Negative for cough.   Cardiovascular: Negative for chest pain.  Gastrointestinal: Negative for abdominal pain, diarrhea, nausea and vomiting.  Genitourinary: Negative for dysuria and hematuria.  Musculoskeletal: Positive for arthralgias, back pain, myalgias and neck pain.  Skin: Negative for rash.  Neurological: Negative for headaches.    Physical Exam Updated Vital Signs BP (!) 150/84 (BP Location: Right Arm)   Pulse (!) 109   Temp 99.5 F (37.5 C) (Oral)   Resp 20   Ht 6' (1.829 m)   Wt 105.7 kg   SpO2 94%   BMI 31.60 kg/m   Physical Exam Vitals and nursing note reviewed.  Constitutional:      Appearance: He is well-developed.  HENT:     Head: Normocephalic and atraumatic.  Eyes:     General:        Right eye: No discharge.        Left eye: No discharge.     Conjunctiva/sclera: Conjunctivae normal.  Cardiovascular:     Rate and Rhythm: Regular rhythm. Tachycardia present.     Pulses:          Dorsalis pedis pulses are 2+ on the right side and 2+ on the left side.     Heart sounds: Normal heart sounds.  Pulmonary:     Effort: Pulmonary effort is normal.     Breath sounds: Normal breath sounds.  Abdominal:     Palpations: Abdomen is soft.     Tenderness: There is no abdominal tenderness.  Musculoskeletal:     Right shoulder: Tenderness  present. Normal range of motion.     Left shoulder: Tenderness present. Normal range of motion.     Right elbow: Normal range of motion.     Left elbow: Normal range of motion.     Cervical back: Normal range of motion and neck supple.     Right hip: Tenderness present. Decreased range of motion.     Left hip: Tenderness present. Decreased range of motion.  Right knee: No tenderness.     Left knee: No tenderness.     Right ankle: Normal range of motion.     Left ankle: Normal range of motion.     Comments: Tenderness to palpation in the bilateral groin creases. Decreased active ROM of hips 2/2 pain with movement.   Skin:    General: Skin is warm and dry.  Neurological:     Mental Status: He is alert.     Comments: Patient with 2 to 3 cm hard firm nodule, superficial, mild overlying erythema to the right lateral aspect of the neck.  He has 2 smaller nodules on the posterior scalp.  They do not really feel fluctuant.     ED Results / Procedures / Treatments   Labs (all labs ordered are listed, but only abnormal results are displayed) Labs Reviewed  CBC WITH DIFFERENTIAL/PLATELET - Abnormal; Notable for the following components:      Result Value   WBC 12.8 (*)    RBC 3.63 (*)    Hemoglobin 9.2 (*)    HCT 29.9 (*)    MCH 25.3 (*)    RDW 15.8 (*)    Platelets 409 (*)    Neutro Abs 10.0 (*)    Monocytes Absolute 1.4 (*)    Abs Immature Granulocytes 0.11 (*)    All other components within normal limits  COMPREHENSIVE METABOLIC PANEL - Abnormal; Notable for the following components:   Sodium 131 (*)    Chloride 93 (*)    Glucose, Bld 133 (*)    Total Protein 5.9 (*)    Albumin 2.8 (*)    All other components within normal limits  RESP PANEL BY RT-PCR (FLU A&B, COVID) ARPGX2  URINALYSIS, ROUTINE W REFLEX MICROSCOPIC  CBC  CREATININE, SERUM    EKG None  Radiology US SOFT TISSUE HEAD & NECK (NON-THYROID)  Result Date: 11/04/2020 CLINICAL DATA:  History of metastatic  disease, now with palpable abnormality involving the right neck. EXAM: ULTRASOUND OF HEAD/NECK SOFT TISSUES TECHNIQUE: Ultrasound examination of the head and neck soft tissues was performed in the area of clinical concern. COMPARISON:  CT abdomen and pelvis-11/03/2020 FINDINGS: Sonographic evaluation of the patient's palpable area of concern involving the superior aspect of the right-side of the neck correlates with an approximately 1.8 x 1.7 x 1.3 cm mixed echogenic hypoechoic subcutaneous nodule. This nodule appears to abut the dermal surface. Otherwise, there is no sonographic correlate for patient's palpable area of concern. Specifically, no regional cervical lymphadenopathy. IMPRESSION: Patient's palpable area of concern involving the superior aspect of the right-side of the neck correlates with an approximately 1.8 cm mixed echogenic subdermal subcutaneous nodule which given history of metastatic malignancy may represent either a subcutaneous metastasis (favored given presence of subcutaneous metastasis on abdominal CT performed 11/03/2020) versus a malignant appearing lymph node. Further evaluation with PET-CT imaging could be performed as indicated. Electronically Signed   By: Sandi Mariscal M.D.   On: 11/04/2020 16:24    Procedures Procedures   Medications Ordered in ED Medications  HYDROmorphone (DILAUDID) injection 0.5 mg (0.5 mg Intravenous Given 11/04/20 1724)  ondansetron (ZOFRAN) injection 4 mg (has no administration in time range)  enoxaparin (LOVENOX) injection 40 mg (has no administration in time range)  acetaminophen (TYLENOL) tablet 650 mg (has no administration in time range)  senna-docusate (Senokot-S) tablet 2 tablet (has no administration in time range)  oxyCODONE (Oxy IR/ROXICODONE) immediate release tablet 5 mg (has no administration in time range)  naloxone (  NARCAN) injection 0.4 mg (has no administration in time range)  ondansetron (ZOFRAN) injection 4 mg (4 mg Intravenous  Given 11/04/20 1640)    ED Course  I have reviewed the triage vital signs and the nursing notes.  Pertinent labs & imaging results that were available during my care of the patient were reviewed by me and considered in my medical decision making (see chart for details).  Patient seen and examined. Work-up initiated. Medications ordered. Reviewed oncology notes.   Vital signs reviewed and are as follows: BP (!) 150/84 (BP Location: Right Arm)   Pulse (!) 109   Temp 99.5 F (37.5 C) (Oral)   Resp 20   Ht 6' (1.829 m)   Wt 105.7 kg   SpO2 94%   BMI 31.60 kg/m   Obtained formal ultrasound of the lateral neck nodule, which is likely subcutaneous metastatic disease, less likely metastatic lymph node.  This is consistent with my exam.  Will treat patient's pain.  Will check lab work and a chest x-ray.  Plan admission.  5:05 PM Discussed patient with Dr. Francia Greaves who agrees with plan. Will discuss with hospitalist.  5:27 PM Spoke with Dr. [place] Crane who admits to observe for pain control.   Updated Dr. Marin Olp by secure text. He states he plans to see patient in AM.     MDM Rules/Calculators/A&P                          Admit.    Final Clinical Impression(s) / ED Diagnoses Final diagnoses:  Nodule of neck  Pain from bone metastases ([place])  Intractable pain    Rx / DC Orders ED Discharge Orders    None       Carlisle Cater, PA-C 11/04/20 1738    Valarie Merino, MD 11/11/20 [phone]

## 2020-11-04 NOTE — H&P (Signed)
History and Physical  [name] TAV:[id number] DOB: [birth year] DOA: 11/04/2020  Referring physician: Carlisle Cater PA, EDP PCP: [name], MD  Outpatient Specialists: Hematology oncology. Patient coming from: Home.  Chief Complaint: Diffuse bone pain.  HPI: Gary Young is a 70 y.o. male with medical history significant for melanoma diagnosed 8 years ago and excised, seasonal allergies, recently diagnosed iron deficiency anemia, who was recently found to have metastatic disease on CT scan at outside facility involving his lungs, liver, and bones with unknown primary.  Onset of symptomatology on 10/11/20 when the patient noted that he was becoming significantly weak with minimal exertion.  He went to see his PCP and was found to be severely iron deficient.  Was prescribed iron supplement and referred to GI.  Few days later due to significant pelvic pain he was subsequently referred to orthopedic surgery where a CT abdomen and pelvis with contrast was done on 10/24/2020 revealing extensive metastatic disease affecting his left lower lobe lung, a lesion measuring 3.6 x 4.2 cm, bilateral pulmonary nodules, hepatic metastasis, retroperitoneal masses, soft tissue mass along the right psoas muscle, lytic lesions involving the thoracolumbar spine and bony pelvis.  He was then referred to oncology Dr. Marin Olp.  He was prescribed fentanyl patches and tramadol.  His pain has not been controlled.  Despite his pain management at home he is still having pain so severe he can barely move.  He called hematology oncology's office on the day of admission due to his uncontrolled pain.  Dr. Marin Olp recommended admission for pain control.  He was sent to the ED for further evaluation and management.  TR[facility], hospitalist team, was asked to admit.  ED Course:  T-max 99.5.  BP 131/71, pulse 104, respiration rate 19, O2 saturation 100% on room air  Review of Systems: Review of systems as noted in the HPI. All other  systems reviewed and are negative.   Past Medical History:  Diagnosis Date  . Asthma   . Cancer [facility])    Past Surgical History:  Procedure Laterality Date  . BACK SURGERY    . EYE SURGERY    . SKIN BIOPSY    . TONSILLECTOMY      Social History:  reports that he has never smoked. He has never used smokeless tobacco. He reports current alcohol use of about 2.0 standard drinks of alcohol per week. He reports that he does not use drugs.   No Known Allergies  Family history: Younger sister deceased at the age of 72 with an MI, 3 years ago. His twin sister with melanoma.  Prior to Admission medications   Medication Sig Start Date End Date Taking? Authorizing Provider  albuterol (VENTOLIN HFA) 108 (90 Base) MCG/ACT inhaler Inhale into the lungs. Patient not taking: Reported on 10/27/2020 10/01/20   [provider]  albuterol (VENTOLIN HFA) 108 (90 Base) MCG/ACT inhaler Inhale into the lungs. Patient not taking: Reported on 10/27/2020 10/01/20   [provider]  Cholecalciferol (VITAMIN D) 50 MCG (2000 UT) tablet Take 2,000 Units by mouth daily.    [provider]  cyanocobalamin 1000 MCG tablet Take by mouth.    [provider]  cyclobenzaprine (FLEXERIL) 10 MG tablet Take 1 tablet by mouth 3 (three) times daily as needed. 10/21/20   [provider]  doxazosin (CARDURA) 8 MG tablet TAKE 1 TABLET EVERY DAY TO IMPROVE BLADDER FUNCTION 02/06/20   [provider]  fentaNYL (DURAGESIC) 25 MCG/HR Place 1 patch onto the skin every  3 (three) days. 10/27/20   Volanda Napoleon, MD  fentaNYL (DURAGESIC) 50 MCG/HR Place 1 patch onto the skin every 3 (three) days. 11/02/20   Volanda Napoleon, MD  ferrous sulfate 325 (65 FE) MG tablet Take by mouth.    [provider]  hydrOXYzine (ATARAX/VISTARIL) 25 MG tablet Take 1 tablet (25 mg total) by mouth 3 (three) times daily as needed. 10/27/20   Volanda Napoleon, MD  latanoprost (XALATAN) 0.005 %  ophthalmic solution INSTILL 1 DROP INTO LEFT EYE NIGHTLY (DISCARD AND BEGIN A NEW BOTTLE AFTER 42 DAYS) 10/01/20   [provider]  timolol (TIMOPTIC) 0.5 % ophthalmic solution INSTILL 1 DROP INTO BOTH EYES EVERY DAY 01/29/20   [provider]  traMADol (ULTRAM) 50 MG tablet Take 50-100 mg by mouth 3 (three) times daily as needed. 10/26/20   [provider]    Physical Exam: BP 131/71 (BP Location: Right Arm)   Pulse (!) 104   Temp 99.5 F (37.5 C) (Oral)   Resp 19   Ht 6' (1.829 m)   Wt 105.7 kg   SpO2 100%   BMI 31.60 kg/m   . General: 70 y.o. year-old male well developed well nourished in no acute distress.  Alert and oriented x3. . Cardiovascular: Regular rate and rhythm with no rubs or gallops.  No thyromegaly or JVD noted.  No lower extremity edema. 2/4 pulses in all 4 extremities. [name] Kitchen Respiratory: Clear to auscultation with no wheezes or rales. Good inspiratory effort. . Abdomen: Soft nontender nondistended with normal bowel sounds x4 quadrants. . Muskuloskeletal: No cyanosis, clubbing or edema noted bilaterally . Neuro: CN II-XII intact, strength, sensation, reflexes . Skin: No ulcerative lesions noted or rashes . Psychiatry: Judgement and insight appear normal. Mood is appropriate for condition and setting          Labs on Admission:  Basic Metabolic Panel: Recent Labs  Lab 11/04/20 1547  NA 131*  K 4.4  CL 93*  CO2 28  GLUCOSE 133*  BUN 21  CREATININE 0.80  CALCIUM 9.2   Liver Function Tests: Recent Labs  Lab 11/04/20 1547  AST 30  ALT 26  ALKPHOS 109  BILITOT 0.6  PROT 5.9*  ALBUMIN 2.8*   No results for input(s): LIPASE, AMYLASE in the last 168 hours. No results for input(s): AMMONIA in the last 168 hours. CBC: Recent Labs  Lab 11/04/20 1547  WBC 12.8*  NEUTROABS 10.0*  HGB 9.2*  HCT 29.9*  MCV 82.4  PLT 409*   Cardiac Enzymes: No results for input(s): CKTOTAL, CKMB, CKMBINDEX, TROPONINI in the last 168 hours.  BNP  (last 3 results) No results for input(s): BNP in the last 8760 hours.  ProBNP (last 3 results) No results for input(s): PROBNP in the last 8760 hours.  CBG: No results for input(s): GLUCAP in the last 168 hours.  Radiological Exams on Admission: US SOFT TISSUE HEAD & NECK (NON-THYROID)  Result Date: 11/04/2020 CLINICAL DATA:  History of metastatic disease, now with palpable abnormality involving the right neck. EXAM: ULTRASOUND OF HEAD/NECK SOFT TISSUES TECHNIQUE: Ultrasound examination of the head and neck soft tissues was performed in the area of clinical concern. COMPARISON:  CT abdomen and pelvis-11/03/2020 FINDINGS: Sonographic evaluation of the patient's palpable area of concern involving the superior aspect of the right-side of the neck correlates with an approximately 1.8 x 1.7 x 1.3 cm mixed echogenic hypoechoic subcutaneous nodule. This nodule appears to abut the dermal surface. Otherwise, there is  no sonographic correlate for patient's palpable area of concern. Specifically, no regional cervical lymphadenopathy. IMPRESSION: Patient's palpable area of concern involving the superior aspect of the right-side of the neck correlates with an approximately 1.8 cm mixed echogenic subdermal subcutaneous nodule which given history of metastatic malignancy may represent either a subcutaneous metastasis (favored given presence of subcutaneous metastasis on abdominal CT performed 11/03/2020) versus a malignant appearing lymph node. Further evaluation with PET-CT imaging could be performed as indicated. Electronically Signed   By: Sandi Mariscal M.D.   On: 11/04/2020 16:24   DG Chest Port 1 View  Result Date: 11/04/2020 CLINICAL DATA:  Shoulder pain, recent cancer diagnosis with RIGHT shoulder pain. EXAM: PORTABLE CHEST 1 VIEW COMPARISON:  CT of the abdomen and pelvis from outside facility on Oct 24, 2020. FINDINGS: Trachea midline. Cardiomediastinal contours and hilar structures with LEFT infrahilar  lobulation in the setting of known LEFT lower lobe mass. Elevation of the RIGHT hemidiaphragm as before. No lobar consolidation.  No gross pleural effusion. Suspected small nodule at the LEFT lung base as well. On limited assessment, no acute skeletal process. IMPRESSION: 1. No acute cardiopulmonary disease in the setting of LEFT infrahilar lobulation with known LEFT lower lobe mass. 2. Other small basilar nodules not well assessed. Electronically Signed   By: Zetta Bills M.D.   On: 11/04/2020 16:54    EKG: I independently viewed the EKG done and my findings are as followed: None available at the time of this visit.  Assessment/Plan Present on Admission: . Intractable pain  Active Problems:   Intractable pain  Metastatic disease, unknown primary. Prior history of melanoma, did not require adjuvant therapy Melanoma diagnosed 8 years ago per the patient. He recently discovered metastatic disease from CT abdomen and pelvis with contrast done on 10/24/2020 from orthopedic surgery's office.  The CT revealed extensive metastatic disease affecting his left lower lobe lung, and lesion measuring 3.6 x 4.2 cm.  Bilateral pulmonary nodules, hepatic metastasis, retroperitoneal masses, soft tissue mass along the right psoas muscle, lytic lesions involving the thoracolumbar spine and bony pelvis.   He has 2 fentanyl patches on and is taking tramadol as prescribed but still having pain so severe he can barely move.   Dr. Marin Olp recommended admission for pain control and will see in consultation. Management per hematology oncology. Palliative care team consulted to assist with establishing goals of care. Patient is a DNR, per himself and his wife at bedside.  Intractable diffuse bones and joints pain Start morphine PCA pump  Bowel regimen IV antiemetics IV Narcan as needed  Hypovolemic hyponatremia Serum sodium 131 Continue gentle IV fluid hydration. Liberalize diet, encourage oral  intake.  Moderate protein calorie malnutrition Dietitian consult Regular diet Oral supplement  Leukocytosis, possibly reactive Obtain UA Chest x-ray showing left lower lobe mass and other small basilar nodules.  Elevated temperature, likely driven by metastatic disease. T-max 99.5 Tylenol as needed  Sinus tachycardia Likely driven by elevated temperature versus dehydration Treat underlying conditions as able  Iron deficiency anemia, suspect related to metastatic disease, likely anemia of chronic disease. Resume home iron supplement Monitor H&H, transfuse as indicated.    DVT prophylaxis: Subcu Lovenox daily  Code Status: DNR stated by the patient and his wife at bedside.  Family Communication: His wife at bedside.  All questions answered to the best of my ability.  Disposition Plan: Admit to telemetry unit  Consults called: Hematology oncology, palliative care medicine.  Admission status: Observation status.   Status is:  Observation    Dispo:  Patient From: Home  Planned Disposition: Home with Health Care S[facility] possibly on 11/05/2020 if pain is controlled.  If pain is not controlled we will switch to inpatient status.  Medically stable for discharge: No      Kayleen Memos MD [facility] Pager [phone]  If 7PM-7AM, please contact night-coverage [email or web address] Password [facility]  11/04/2020, 5:32 PM

## 2020-11-04 NOTE — ED Notes (Signed)
RN and EMT assisted pt back into bed

## 2020-11-04 NOTE — ED Notes (Addendum)
Assisted pt to chair to use urinal. Call bell within reach.

## 2020-11-04 NOTE — ED Notes (Addendum)
Pt O2 saturation fell to upper 80s with good pleth. Pt placed on nasal cannula at 2.5L to bring O2 above 90%

## 2020-11-04 NOTE — ED Notes (Signed)
RN checked on pt. Pt states he needs more time to use urinal. Call bell within reach.

## 2020-11-04 NOTE — Progress Notes (Signed)
Patient continues to have issues with severe pain. He has 2 fentanyl patches on (70mcg) and is taking Tramadol as prescribed but still having pain so severe he can barely move. He states the pain is mostly in his groin and shoulder, however it radiates, so in essence he hurts all over. He states he feels like he has new knots developing on his scalp, back and shoulder. He is unsure what to do to manage the pain, but he states he needs something.  Reviewed with Dr Marin Olp. Patient called on Monday with similar complaints and increasing his pain management was ineffective. Dr Marin Olp believes patient needs to be admitted for pain management. Directed to go to the [facility] emergency room for assessment and hopeful admission.  Called and spoke to patient and his wife explaining Dr Antonieta Pert recommendation. They understood and will bring him to [facility] ED.  Oncology Nurse Navigator Documentation  Oncology Nurse Navigator Flowsheets 11/04/2020  Abnormal Finding Date -  Confirmed Diagnosis Date -  Diagnosis Status -  Navigator Follow Up Date: 11/06/2020  Navigator Follow Up Reason: Appointment Review  Navigator Location [facility]  Referral Date to RadOnc/MedOnc -  Navigator Encounter Type Telephone  Telephone Symptom Mgt;Incoming Call  Patient Visit Type MedOnc  Treatment Phase Abnormal Scans  Barriers/Navigation Needs Coordination of Care;Education  Education Pain/ Symptom Management;Other  Interventions Education;Psycho-Social Support  Acuity Level 2-Minimal Needs (1-2 Barriers Identified)  Coordination of Care -  Education Method Verbal  Support Groups/Services Friends and Family  Time Spent with Patient 52

## 2020-11-04 NOTE — ED Triage Notes (Signed)
Arrives via EMS- from home, diagnosed with Cancer 1 week ago.  Patient presents to ER for pain control.  Red nodule noted to the right side of his neck.  Patient co having multiple pain sites, neck, groin and bilateral shoulder.  The patient is currently prescribed tramadol and Fentanyl patches for pain.  Patient answers all questions appropriately

## 2020-11-04 NOTE — ED Notes (Signed)
PA is at the bedside

## 2020-11-05 ENCOUNTER — Other Ambulatory Visit: Payer: Self-pay

## 2020-11-05 ENCOUNTER — Encounter (HOSPITAL_COMMUNITY): Payer: Self-pay | Admitting: Internal Medicine

## 2020-11-05 ENCOUNTER — Observation Stay (HOSPITAL_COMMUNITY): Payer: Medicare HMO

## 2020-11-05 ENCOUNTER — Inpatient Hospital Stay (HOSPITAL_COMMUNITY): Payer: Medicare HMO

## 2020-11-05 DIAGNOSIS — C801 Malignant (primary) neoplasm, unspecified: Secondary | ICD-10-CM

## 2020-11-05 DIAGNOSIS — R221 Localized swelling, mass and lump, neck: Secondary | ICD-10-CM

## 2020-11-05 DIAGNOSIS — D72829 Elevated white blood cell count, unspecified: Secondary | ICD-10-CM | POA: Diagnosis present

## 2020-11-05 DIAGNOSIS — G893 Neoplasm related pain (acute) (chronic): Secondary | ICD-10-CM | POA: Diagnosis present

## 2020-11-05 DIAGNOSIS — M79661 Pain in right lower leg: Secondary | ICD-10-CM | POA: Diagnosis present

## 2020-11-05 DIAGNOSIS — R52 Pain, unspecified: Secondary | ICD-10-CM | POA: Diagnosis not present

## 2020-11-05 DIAGNOSIS — G936 Cerebral edema: Secondary | ICD-10-CM | POA: Diagnosis present

## 2020-11-05 DIAGNOSIS — Z6831 Body mass index (BMI) 31.0-31.9, adult: Secondary | ICD-10-CM | POA: Diagnosis not present

## 2020-11-05 DIAGNOSIS — C7951 Secondary malignant neoplasm of bone: Secondary | ICD-10-CM | POA: Diagnosis present

## 2020-11-05 DIAGNOSIS — Z7189 Other specified counseling: Secondary | ICD-10-CM | POA: Diagnosis not present

## 2020-11-05 DIAGNOSIS — R160 Hepatomegaly, not elsewhere classified: Secondary | ICD-10-CM | POA: Diagnosis not present

## 2020-11-05 DIAGNOSIS — C7802 Secondary malignant neoplasm of left lung: Secondary | ICD-10-CM | POA: Diagnosis present

## 2020-11-05 DIAGNOSIS — E669 Obesity, unspecified: Secondary | ICD-10-CM | POA: Diagnosis present

## 2020-11-05 DIAGNOSIS — D630 Anemia in neoplastic disease: Secondary | ICD-10-CM | POA: Diagnosis present

## 2020-11-05 DIAGNOSIS — J45909 Unspecified asthma, uncomplicated: Secondary | ICD-10-CM | POA: Diagnosis present

## 2020-11-05 DIAGNOSIS — C439 Malignant melanoma of skin, unspecified: Secondary | ICD-10-CM | POA: Diagnosis not present

## 2020-11-05 DIAGNOSIS — Z85820 Personal history of malignant melanoma of skin: Secondary | ICD-10-CM

## 2020-11-05 DIAGNOSIS — Z66 Do not resuscitate: Secondary | ICD-10-CM | POA: Diagnosis present

## 2020-11-05 DIAGNOSIS — D509 Iron deficiency anemia, unspecified: Secondary | ICD-10-CM | POA: Diagnosis present

## 2020-11-05 DIAGNOSIS — K259 Gastric ulcer, unspecified as acute or chronic, without hemorrhage or perforation: Secondary | ICD-10-CM | POA: Diagnosis present

## 2020-11-05 DIAGNOSIS — T380X5A Adverse effect of glucocorticoids and synthetic analogues, initial encounter: Secondary | ICD-10-CM | POA: Diagnosis present

## 2020-11-05 DIAGNOSIS — E871 Hypo-osmolality and hyponatremia: Secondary | ICD-10-CM | POA: Diagnosis present

## 2020-11-05 DIAGNOSIS — E861 Hypovolemia: Secondary | ICD-10-CM | POA: Diagnosis present

## 2020-11-05 DIAGNOSIS — C7931 Secondary malignant neoplasm of brain: Secondary | ICD-10-CM | POA: Diagnosis present

## 2020-11-05 DIAGNOSIS — C787 Secondary malignant neoplasm of liver and intrahepatic bile duct: Secondary | ICD-10-CM | POA: Diagnosis present

## 2020-11-05 DIAGNOSIS — K2970 Gastritis, unspecified, without bleeding: Secondary | ICD-10-CM | POA: Diagnosis present

## 2020-11-05 DIAGNOSIS — Z515 Encounter for palliative care: Secondary | ICD-10-CM | POA: Diagnosis not present

## 2020-11-05 DIAGNOSIS — K317 Polyp of stomach and duodenum: Secondary | ICD-10-CM | POA: Diagnosis present

## 2020-11-05 DIAGNOSIS — E440 Moderate protein-calorie malnutrition: Secondary | ICD-10-CM | POA: Diagnosis present

## 2020-11-05 DIAGNOSIS — C786 Secondary malignant neoplasm of retroperitoneum and peritoneum: Secondary | ICD-10-CM | POA: Diagnosis present

## 2020-11-05 DIAGNOSIS — F419 Anxiety disorder, unspecified: Secondary | ICD-10-CM | POA: Diagnosis present

## 2020-11-05 DIAGNOSIS — Z20822 Contact with and (suspected) exposure to covid-19: Secondary | ICD-10-CM | POA: Diagnosis present

## 2020-11-05 DIAGNOSIS — C438 Malignant melanoma of overlapping sites of skin: Secondary | ICD-10-CM | POA: Diagnosis not present

## 2020-11-05 DIAGNOSIS — R609 Edema, unspecified: Secondary | ICD-10-CM | POA: Diagnosis not present

## 2020-11-05 HISTORY — PX: IR IMAGING GUIDED PORT INSERTION: IMG5740

## 2020-11-05 HISTORY — PX: IR US GUIDE BX ASP/DRAIN: IMG2392

## 2020-11-05 LAB — COMPREHENSIVE METABOLIC PANEL
ALT: 23 U/L (ref 0–44)
AST: 29 U/L (ref 15–41)
Albumin: 2.7 g/dL — ABNORMAL LOW (ref 3.5–5.0)
Alkaline Phosphatase: 105 U/L (ref 38–126)
Anion gap: 7 (ref 5–15)
BUN: 22 mg/dL (ref 8–23)
CO2: 34 mmol/L — ABNORMAL HIGH (ref 22–32)
Calcium: 9.2 mg/dL (ref 8.9–10.3)
Chloride: 94 mmol/L — ABNORMAL LOW (ref 98–111)
Creatinine, Ser: 0.78 mg/dL (ref 0.61–1.24)
GFR, Estimated: >60 mL/min (ref >60)
Glucose, Bld: 133 mg/dL — ABNORMAL HIGH (ref 70–99)
Potassium: 4.7 mmol/L (ref 3.5–5.1)
Sodium: 135 mmol/L (ref 135–145)
Total Bilirubin: 0.3 mg/dL (ref 0.3–1.2)
Total Protein: 5.5 g/dL — ABNORMAL LOW (ref 6.5–8.1)

## 2020-11-05 LAB — CBC
HCT: 28.6 % — ABNORMAL LOW (ref 39.0–52.0)
Hemoglobin: 8.7 g/dL — ABNORMAL LOW (ref 13.0–17.0)
MCH: 25.5 pg — ABNORMAL LOW (ref 26.0–34.0)
MCHC: 30.4 g/dL (ref 30.0–36.0)
MCV: 83.9 fL (ref 80.0–100.0)
Platelets: 365 10*3/uL (ref 150–400)
RBC: 3.41 MIL/uL — ABNORMAL LOW (ref 4.22–5.81)
RDW: 15.9 % — ABNORMAL HIGH (ref 11.5–15.5)
WBC: 12.2 10*3/uL — ABNORMAL HIGH (ref 4.0–10.5)
nRBC: 0 % (ref 0.0–0.2)

## 2020-11-05 LAB — SAVE SMEAR(SSMR), FOR PROVIDER SLIDE REVIEW

## 2020-11-05 LAB — PHOSPHORUS: Phosphorus: 4.5 mg/dL (ref 2.5–4.6)

## 2020-11-05 LAB — MAGNESIUM: Magnesium: 2 mg/dL (ref 1.7–2.4)

## 2020-11-05 IMAGING — XA IR IMAGING GUIDED PORT INSERTION
2 series · 10 of 10 positions shown · non-contrast
Comparison: CT the chest, abdomen and pelvis-[DATE]

INDICATION: Remote history of melanoma, now with metastatic disease of unknown
primary. Please from ultrasound-guided biopsy of amenable site of
malignancy for tissue diagnostic purposes. Additionally, please
place image guided Port a catheter for the initiation of
chemotherapy.

EXAM:
1. ULTRASOUND-GUIDED BIOPSY OF INDETERMINATE SUBCUTANEOUS NODULE
ALONG THE RIGHT LATERAL ABDOMINAL WALL
2. IMPLANTED PORT A CATH PLACEMENT WITH ULTRASOUND AND FLUOROSCOPIC
GUIDANCE

[Series 1: ir fluoro/shunt/fist · 9 of 9 slices shown]
[im 1/9]
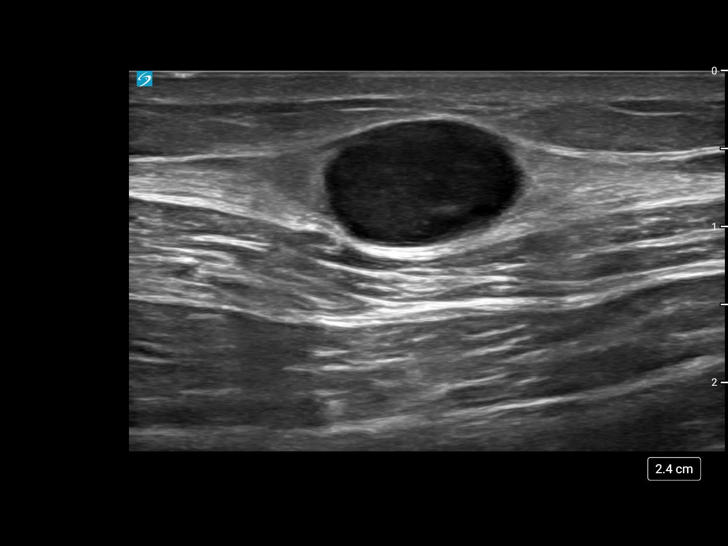
[im 2/9]
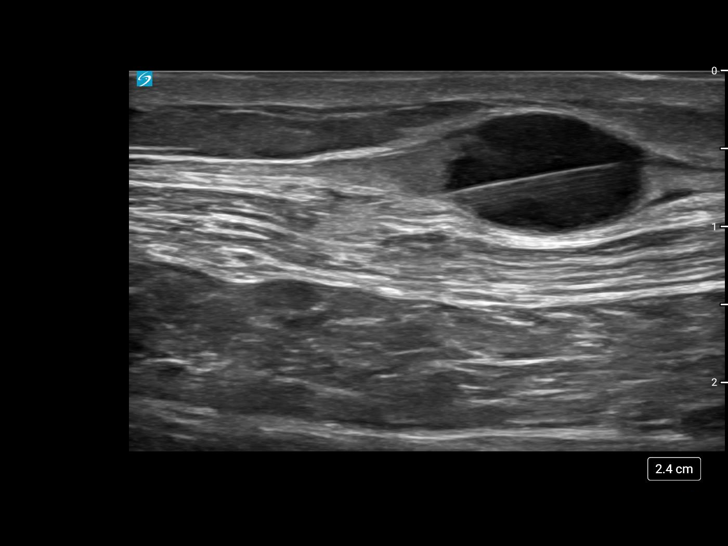
[im 3/9]
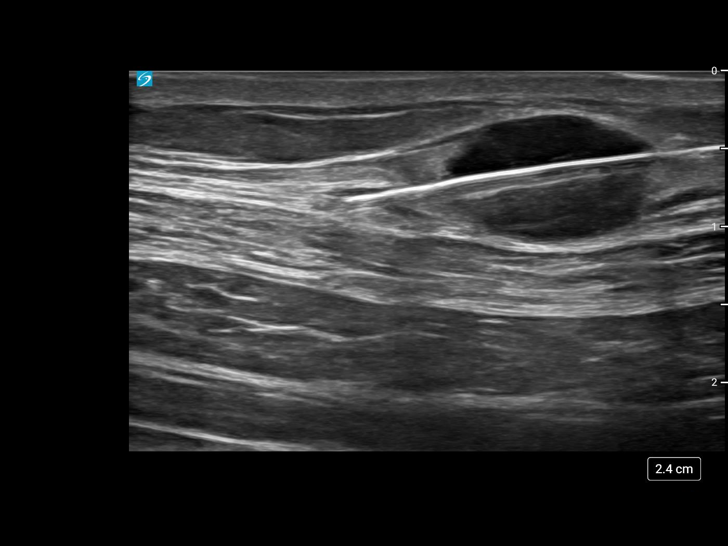
[im 4/9]
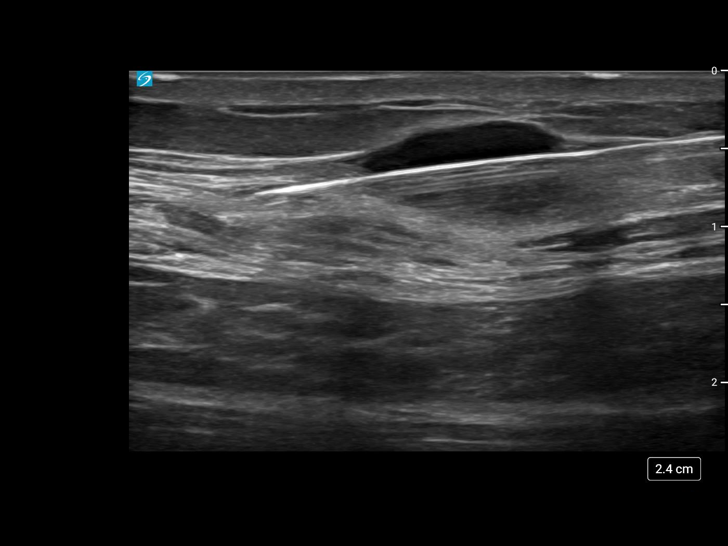
[im 5/9]
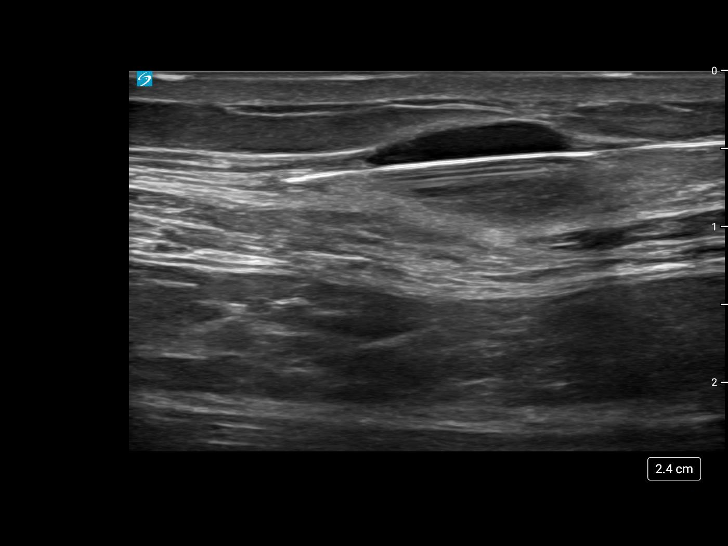
[im 6/9]
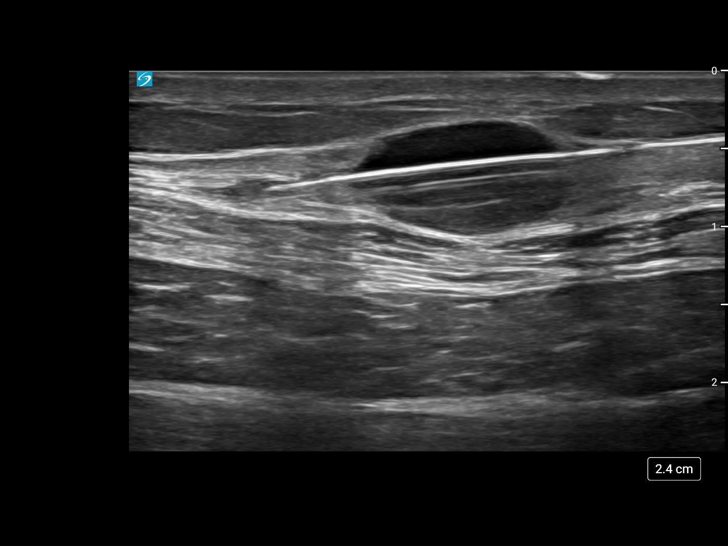
[im 7/9]
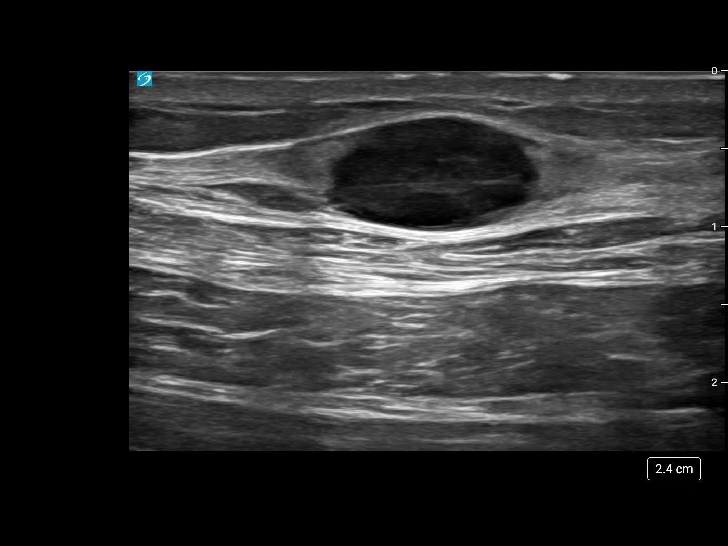
[im 8/9]
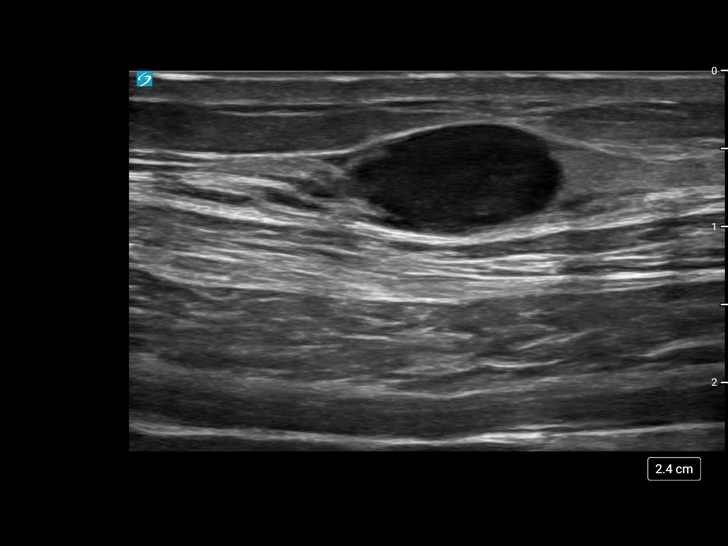
[im 9/9]
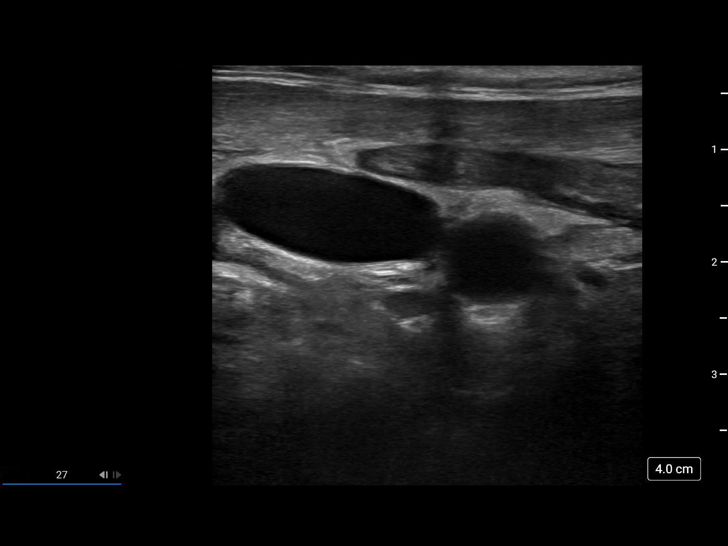

[Series 300: line placements · 1 of 1 slices shown]
[im 1/1]
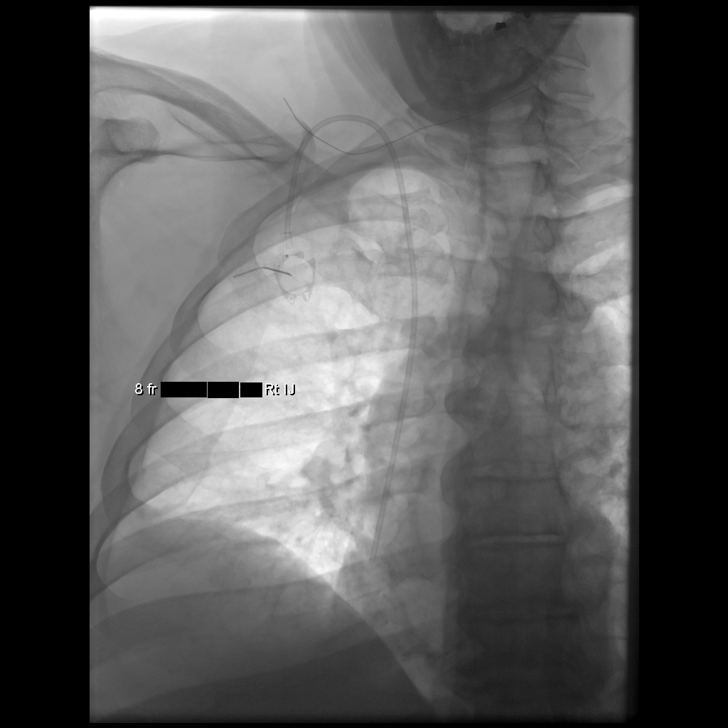

[10 of 10 positions shown; findings below may reference images not displayed]

MEDICATIONS:
None.

ANESTHESIA/SEDATION:
Moderate (conscious) sedation was employed during this procedure. A
total of Versed 3 mg and Fentanyl 100 mcg was administered
intravenously.

Moderate Sedation Time: 56 minutes. The patient's level of
consciousness and vital signs were monitored continuously by
radiology nursing throughout the procedure under my direct
supervision.

CONTRAST:  None

FLUOROSCOPY TIME:  36 seconds (60 mGy)

COMPLICATIONS:
None immediate.

PROCEDURE:
The procedure, risks, benefits, and alternatives were explained to
the patient. Questions regarding the procedure were encouraged and
answered. The patient understands and consents to the procedure.

Sonographic evaluation was performed of the lateral aspect of the
right abdominal wall demonstrating an approximately 1.3 x 0.8 cm
hypoechoic nodule correlating with the dominant subcutaneous nodule
seen on preceding abdominal CT image 89, series 2. This subcutaneous
nodule was targeted for biopsy given location and sonographic
window.

The skin overlying the nodule was prepped and draped in usual
sterile fashion. After the overlying soft tissues were anesthetized
1% lidocaine with epinephrine, 6 core needle biopsy samples were
obtained with an 18 gauge core needle biopsy device. Samples were
placed in formalin and submitted to pathology for analysis.

Attention was now paid towards placement of the port a catheter.

The right neck and chest were prepped with chlorhexidine in a
sterile fashion, and a sterile drape was applied covering the
operative field. Maximum barrier sterile technique with sterile
gowns and gloves were used for the procedure. A timeout was
performed prior to the initiation of the procedure. Local anesthesia
was provided with 1% lidocaine with epinephrine.

After creating a small venotomy incision, a micropuncture kit was
utilized to access the internal jugular vein. Real-time ultrasound
guidance was utilized for vascular access including the acquisition
of a permanent ultrasound image documenting patency of the accessed
vessel. The microwire was utilized to measure appropriate catheter
length.

A subcutaneous port pocket was then created along the upper chest
wall utilizing a combination of sharp and blunt dissection. The
pocket was irrigated with sterile saline. A single lumen "Slim"
sized power injectable port was chosen for placement. The 8 Fr
catheter was tunneled from the port pocket site to the venotomy
incision. The port was placed in the pocket. The external catheter
was trimmed to appropriate length. At the venotomy, an 8 Fr
peel-away sheath was placed over a guidewire under fluoroscopic
guidance. The catheter was then placed through the sheath and the
sheath was removed. Final catheter positioning was confirmed and
documented with a fluoroscopic spot radiograph. The port was
accessed with KOTB needle, aspirated and flushed with heparinized
saline.

The venotomy site was closed with an interrupted 4-0 Vicryl suture.
The port pocket incision was closed with interrupted 2-0 Vicryl
suture. The skin was opposed with a running subcuticular 4-0 Vicryl
suture. Dermabond and KOTB were applied to both incisions.
Dressings were applied. The patient tolerated the procedure well
without immediate post procedural complication.
FINDINGS: Sonographic evaluation was performed of the lateral aspect of the
right abdominal wall demonstrating an approximately 1.3 x 0.8 cm
hypoechoic nodule correlating with the dominant subcutaneous nodule
seen on preceding abdominal CT image 89, series 2. This nodule was
successfully biopsied with ultrasound guidance yielding the
acquisition of adequate tissue.

After catheter placement, the tip lies within the superior
cavoatrial junction. The catheter aspirates and flushes normally and
is ready for immediate use.
IMPRESSION: 1. Technically successful ultrasound-guided core needle biopsy of
indeterminate subcutaneous nodule within the right lateral abdominal
wall.
2. Successful placement of a right internal jugular approach power
injectable Port-A-Cath. The catheter is ready for immediate use.

## 2020-11-05 IMAGING — CT CT CHEST-ABD-PELV W/ CM
2 of 5 series · 11 of 36 positions shown, 12 images · IV contrast (omnipaque)
Comparison: Outside CT from [DATE]

CLINICAL DATA: Metastatic malignancy of unknown primary. History of
melanoma six years ago.

EXAM:
CT CHEST, ABDOMEN, AND PELVIS WITH CONTRAST
TECHNIQUE: Multidetector CT imaging of the chest, abdomen and pelvis was
performed following the standard protocol during bolus
administration of intravenous contrast.
CONTRAST:  100mL OMNIPAQUE IOHEXOL 300 MG/ML  SOLN

[Series 2: cap with · axial · 0.94mm/px · z∈[-667,-107]mm · 8 of 138 slices shown, 9 images]
[im 13/138  mediastinal]
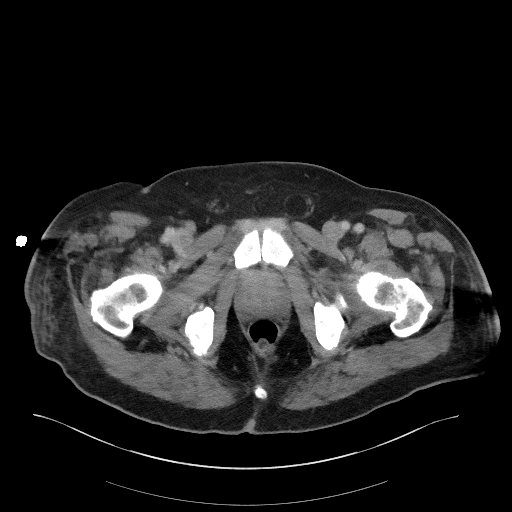
[im 13/138  bone]
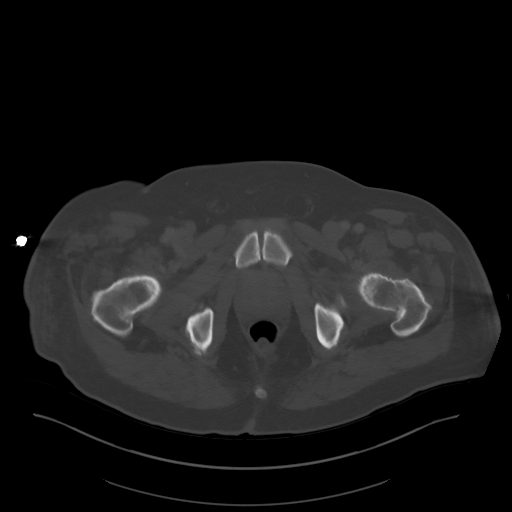
[im 25/138  mediastinal]
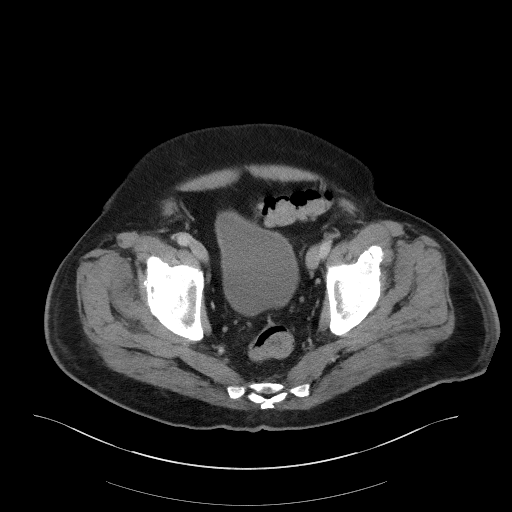
[im 50/138  mediastinal]
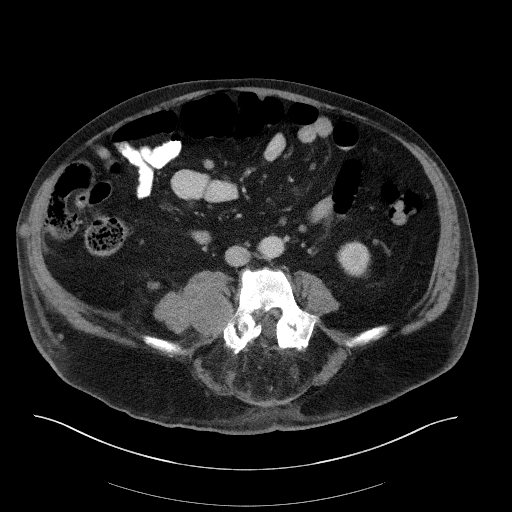
[im 63/138  mediastinal]
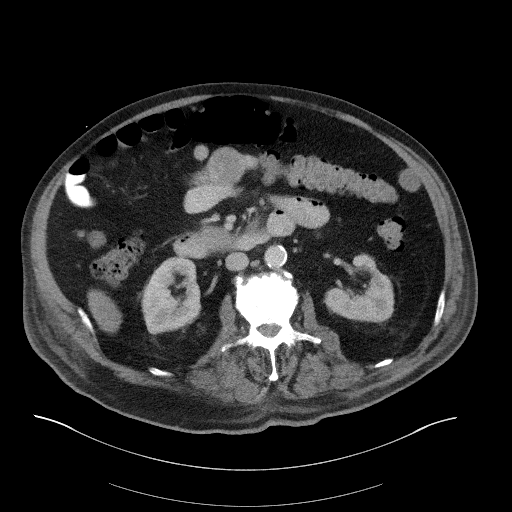
[im 75/138  mediastinal]
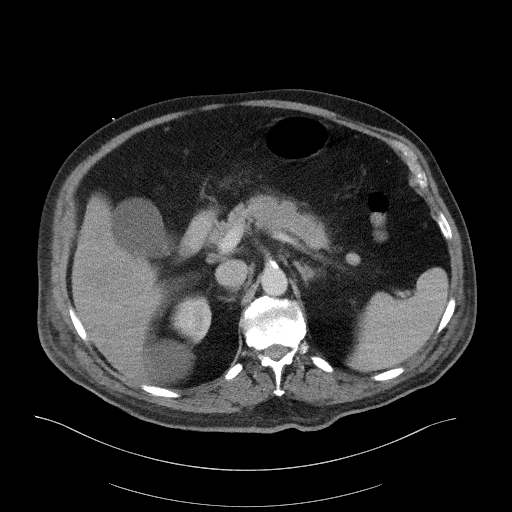
[im 88/138  mediastinal]
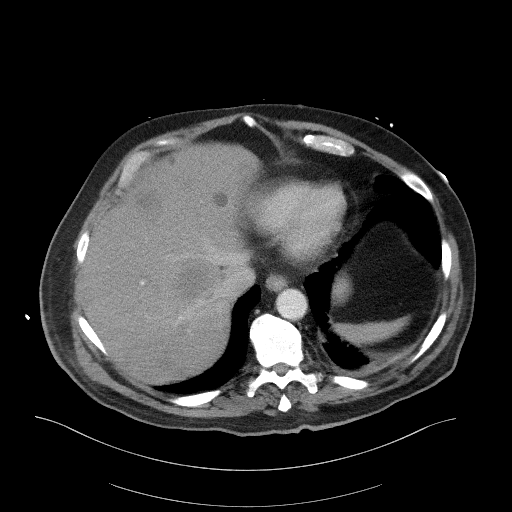
[im 113/138  mediastinal]
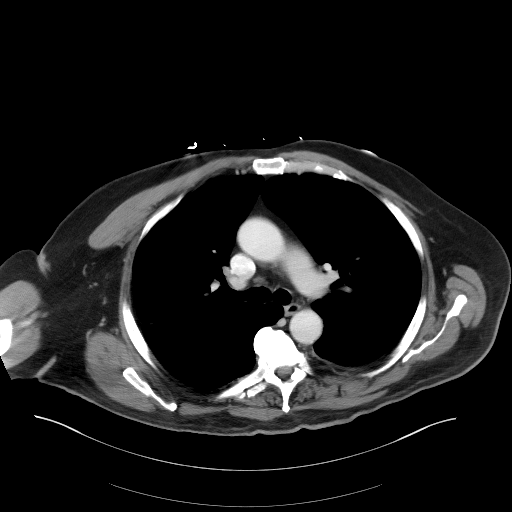
[im 125/138  mediastinal]
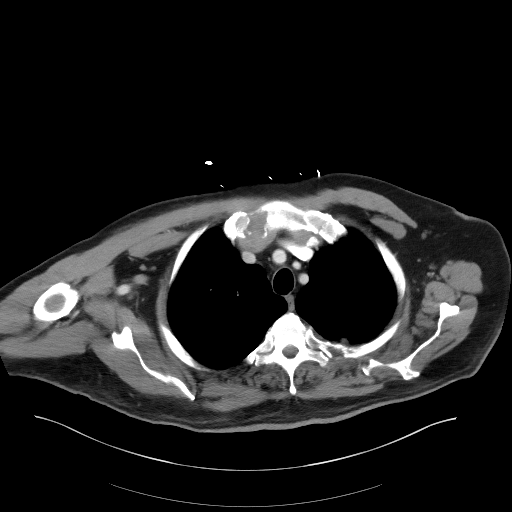

[Series 4: coronals · coronal · 0.97mm/px · 3 of 178 slices shown]
[im 36/178  mediastinal]
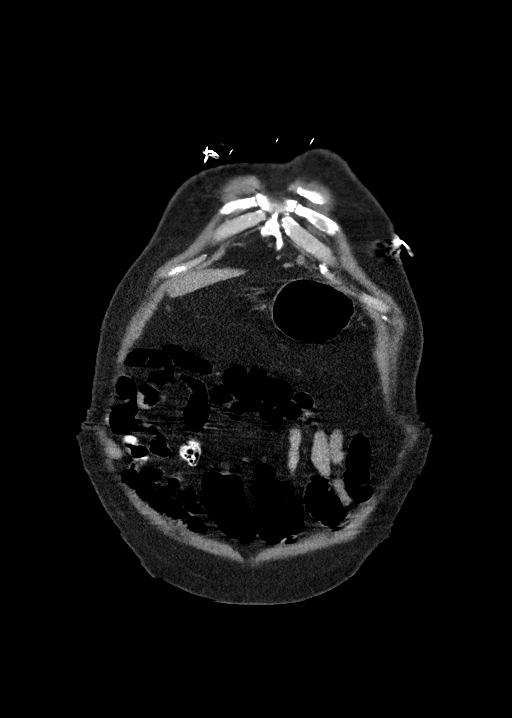
[im 71/178  mediastinal]
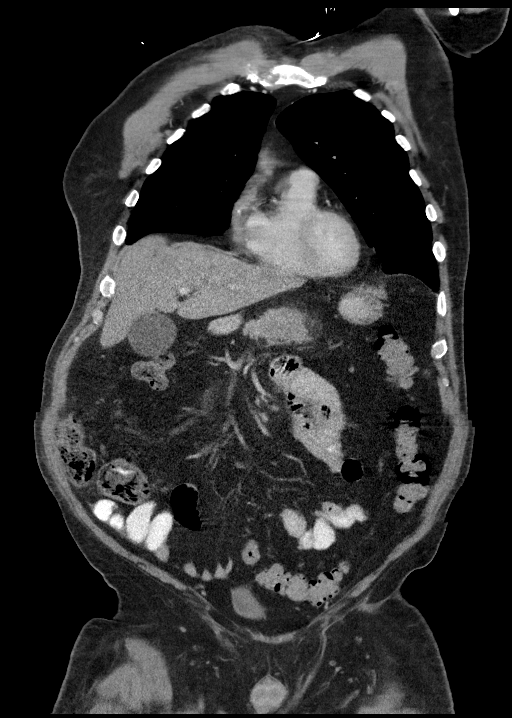
[im 107/178  mediastinal]
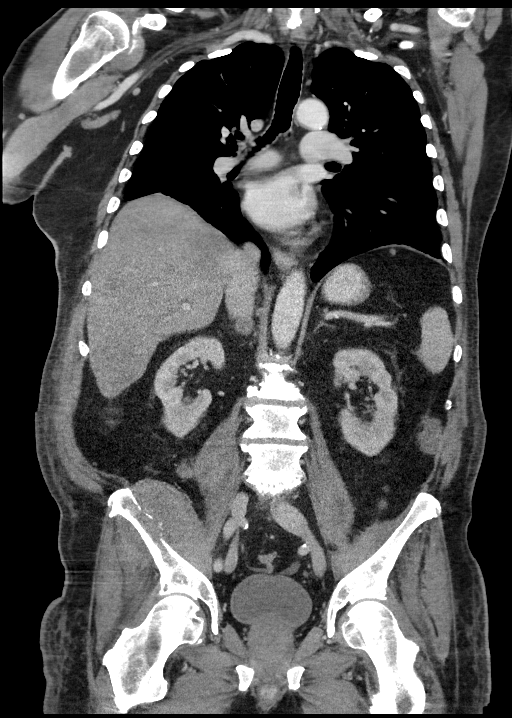

[11 of 36 positions shown; findings below may reference images not displayed]

FINDINGS: CT CHEST FINDINGS

Cardiovascular: Normal heart size. No pericardial effusion. Aortic
atherosclerosis and coronary artery calcifications.

Mediastinum/Nodes: No discrete thyroid nodule. No enlarged axillary
or supraclavicular lymph nodes. No mediastinal or hilar adenopathy.

Lungs/Pleura: Trace left pleural effusion may be partially
loculated. Multifocal pulmonary nodules are identified. The majority
of these nodules are tiny, less than 4 mm, and too numerous to
count. The larger nodules include:

-large paravertebral mass within the superior segment of left lower
lobe measuring 4.5 by 4.2 by 7.0 cm, image 88/6.

-posterior right lower lobe nodule measures 0.9 cm, image 94/6.

-posterolateral left upper lobe nodule measures 0.8 cm, image 75/6.

-Posterior right upper lobe lung nodule measures 1.1 cm, image 59/6.

Musculoskeletal: Multifocal lytic bone metastases are identified.

-Expansile lytic lesion involving the head of the right clavicle has
associated pathologic fracture. This measures approximately 3.6 x
3.7 cm, image [DATE].

-Lytic lesion with large soft tissue component involves the
right-side of the sternal manubrium measuring 3.8 x 3.8 cm, image
[DATE].

-Large permeative lesion involving greater than 50% of the T11
vertebral body is noted measuring at least 3.8 x 3.7 by 2.3 cm. I
suspect there is a pathologic fracture involving this vertebra which
is vertically oriented extending from the superior to inferior
endplate. There is also cortical destruction along the left side of
the vertebral body as well as the posterior cortex, image 136/4 and
image 139/5.

-Smaller lucent lesion is noted within the smaller lucent lesion
within T7 measures 1.3 cm, image 35/2.

CT ABDOMEN PELVIS FINDINGS

Hepatobiliary: Multiple liver metastases are identified.

Index lesion within the dome measures 4.1 x 4.6 cm, image 47/2.

Index lesion within inferior right hepatic lobe, segment 6, measures
5.8 x 5.7 cm, image 69/2.

Index lesion within lateral segment of left hepatic lobe measures
2.2 x 2.0 cm, image 53/2.

Multiple small stones noted within the gallbladder. No gallbladder
wall thickening or inflammation.

Pancreas: Previously normal appearing body and tail of pancreas now
appears edematous with peripancreatic soft tissue stranding, image
63/2.

Within the head of pancreas there is a focal area of relative
hypoenhancement which measures 2.2 by 3.0 x 2.6 cm, image 75/2 and
image 82/4. Similar to the body and tail this area has mild
surrounding haziness. No main duct dilatation identified.

Spleen: Normal in size without focal abnormality.

Adrenals/Urinary Tract: Left adrenal gland appears normal. Nodule in
the right adrenal gland measures 3.2 by 1.8 cm, image 61/2.
Indeterminate.

Cyst arising off the posterior cortex of the upper pole of right
kidney measures 5.5 cm, image 66/2. There is an adjacent, mildly
hyperdense lesion 3.7 by 2.7 cm, image 71/2. This measures 25
Hounsfield units and therefore does not meet criteria for simple
cysts. Similarly, there is a small exophytic lesion arising off the
posterolateral cortex of the inferior pole of right kidney measuring
1.3 cm and 30 Hounsfield units, image 84/2.

Tiny exophytic lesion arising off the posterior cortex of the
interpolar left kidney is too small to characterize measuring 6 mm,
image 79/2.

Urinary bladder is unremarkable.

Stomach/Bowel: Stomach is nondistended. No dilated loops of large or
small bowel. No bowel wall thickening or inflammation. No
obstructing colon mass identified.

Vascular/Lymphatic: Aortic atherosclerosis without aneurysm.
Enlarged mesenteric lymph nodes are identified including:

-central mesenteric node measures 1.5 cm, image 77/2.

-also within the central mesentery is a 1.7 cm lymph node, image
81/2.

Reproductive: Mild prostate gland enlargement.

Other: No ascites.

Multifocal peritoneal nodules are identified compatible with
carcinomatosis.

-Index nodule in the left abdomen along the undersurface of the
abdominal wall measures 2.1 cm, image 76/2.

-Index nodule in the right abdomen anterior to the ascending colon
measures 1.6 cm, image 76/2.

Multiple retroperitoneal lesions are also identified. Paravertebral
mass posterior to the right psoas muscle measures 7.3 x 4.5 cm,
image 90/2.

-left lower quadrant pericolic gutter nodule measures 1.2 cm, image
97/2

Within the left abdomen lateral to the interpolar left kidney is a
nodule measuring 3.5 cm, image 84/2.

Musculoskeletal: Multifocal lytic bone metastases are identified.

-large lesion involving the right iliopsoas muscle and right iliac
bone measures 7.3 x 6.5 cm, image 98/2. Associated permeative
changes are noted involving the iliac bone.

-Lytic lesion involving the posterior right iliac bone measures
x 4.7 cm, image 97/2.

-lytic lesion involving the left iliac wing measures 3.8 x 2.1 cm.

-A few scattered subcutaneous lesions are also identified,
including:

-soft tissue nodule within the right flank measuring 1.1 cm, image
89/2.

-Right gluteal nodule measuring 0.9 cm, image 95/2.

-ventral abdominal wall nodule measures 7 mm, image 103/3.
IMPRESSION: 1. Multiple pulmonary metastases. Most of these are tiny and too
numerous to count. There is a dominant lesion involving the left
lower lobe with long axis diameter of 7 cm.
2. Multifocal lytic bone metastases. This includes a large
permeative lesion involving the T11 vertebral body which arose
through the left lateral wall and posterior wall of the vertebral
body. If there are clinical concerns for canal involvement consider
further evaluation with contrast enhanced MRI of the thoracic spine.
3. Multifocal liver metastasis.
4. Peritoneal and retroperitoneal metastasis.
5. Abnormal appearance of the pancreas. New from [DATE] is edema
and peripancreatic soft tissue stranding involving the body and tail
of pancreas. Although the appearance is consistent with acute
pancreatitis metastatic disease is not excluded.
6. Focal masslike enlargement involving the uncinate process of the
pancreas which appears slightly hypodense to the adjacent normal
parenchyma is indeterminate. This may represent an area of
pancreatitis, metastatic disease or a primary pancreatic neoplasm.
7. Indeterminate right adrenal gland nodule. Cannot rule out
metastatic disease.
8. There is a indeterminate lesion arising off the posterior cortex
of the right kidney which does not meet criteria for a simple cyst.
Similarly, there is a small complex lesion arising off the inferior
pole of the right kidney.
9. Gallstones.
10. Trace left pleural effusion may be partially loculated.
11. Aortic atherosclerosis and coronary artery calcifications.

Aortic Atherosclerosis ([CL]-[CL]).

## 2020-11-05 IMAGING — US US SOFT TISSUE HEAD/NECK
1 series · 15 of 15 positions shown · non-contrast
Comparison: CT abdomen and pelvis-[DATE]

CLINICAL DATA: History of metastatic disease, now with palpable
abnormality involving the right neck.

EXAM:
ULTRASOUND OF HEAD/NECK SOFT TISSUES
TECHNIQUE: Ultrasound examination of the head and neck soft tissues was
performed in the area of clinical concern.

[Series 1: us soft tissue head/neck mc & wl · 15 acquisitions, 15 frames shown]
[im 1/15]
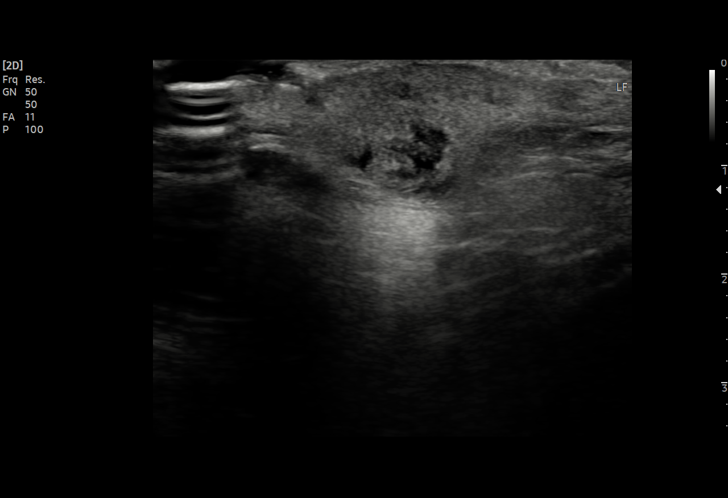
[im 2/15]
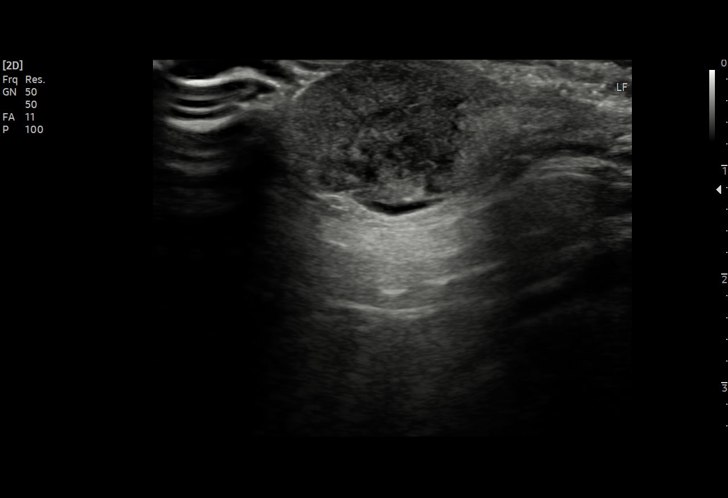
[im 3/15]
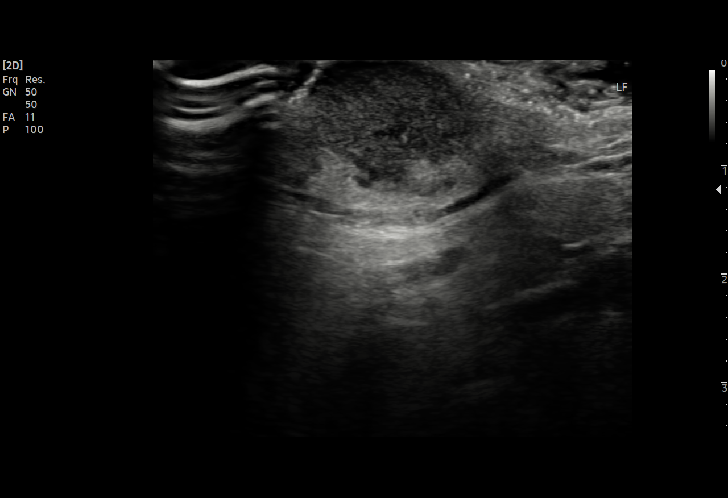
[im 4/15]
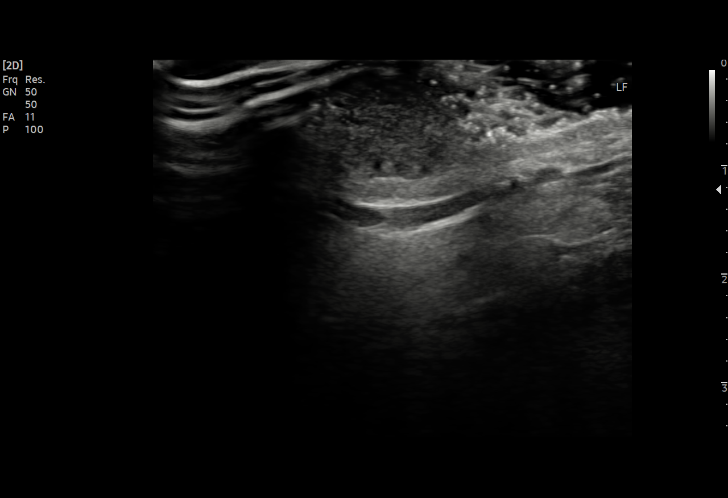
[im 5/15]
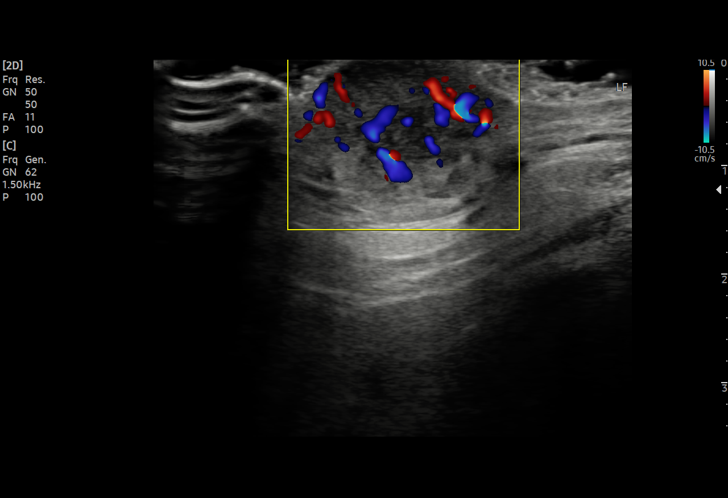
[im 6/15]
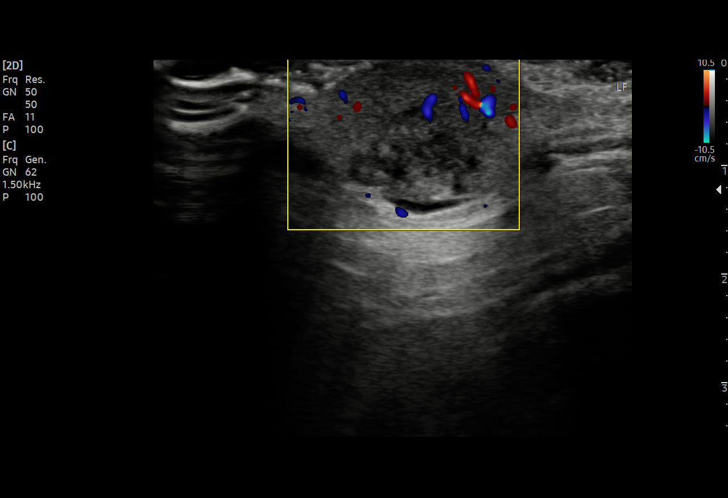
[im 7/15]
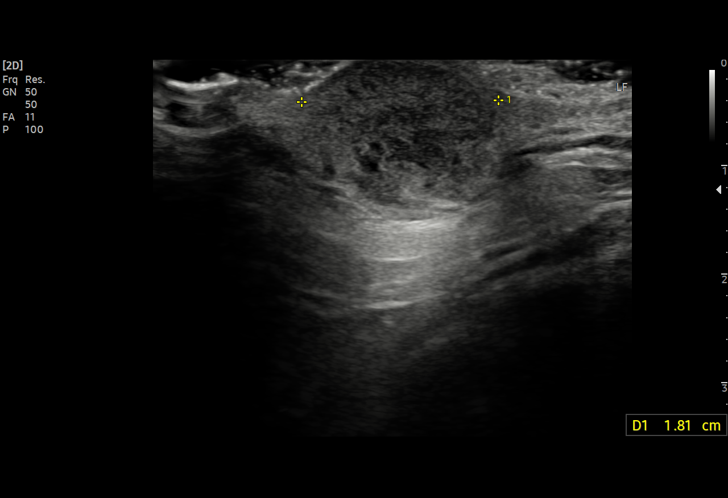
[im 8/15]
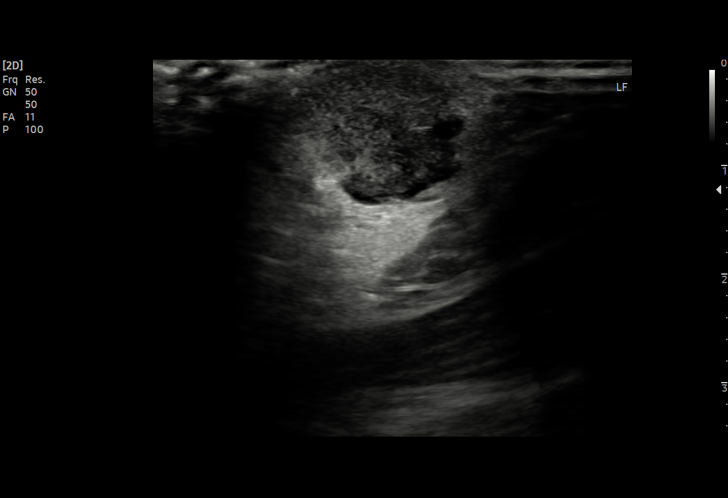
[im 9/15]
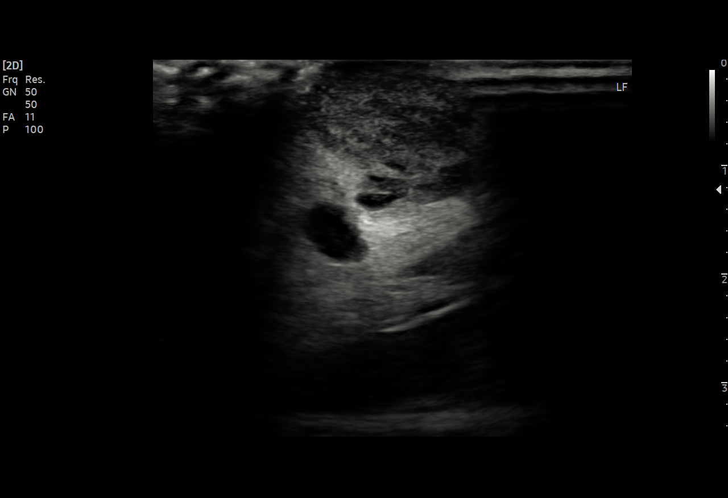
[im 10/15]
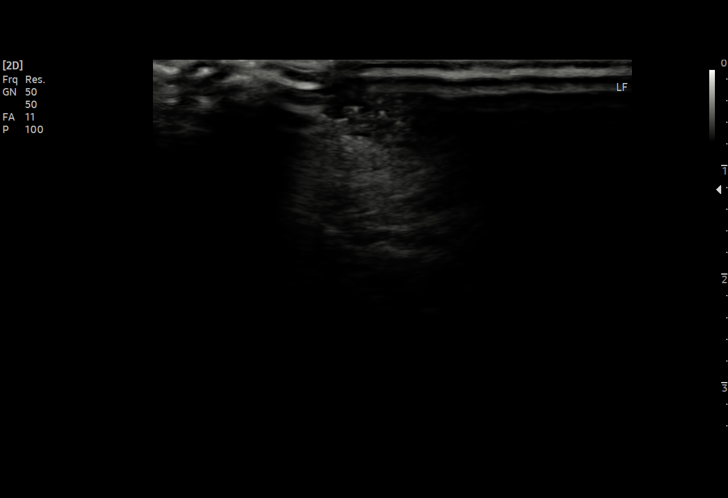
[im 11/15]
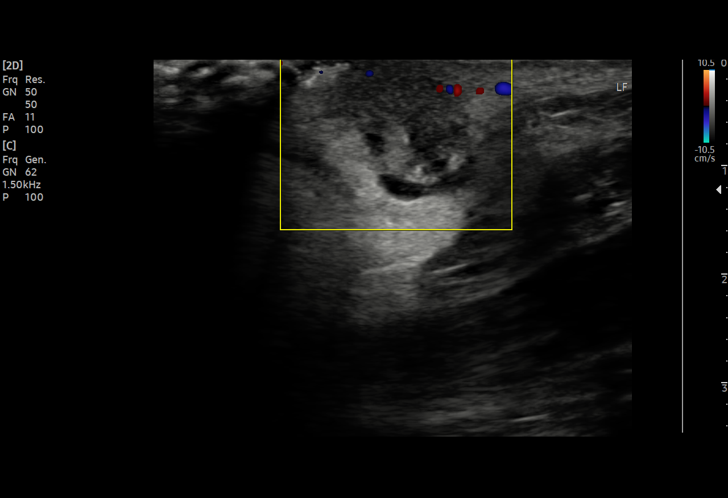
[im 12/15]
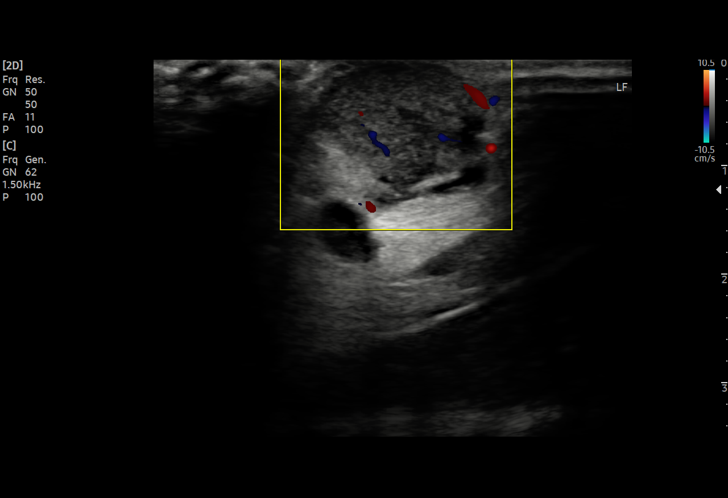
[im 13/15]
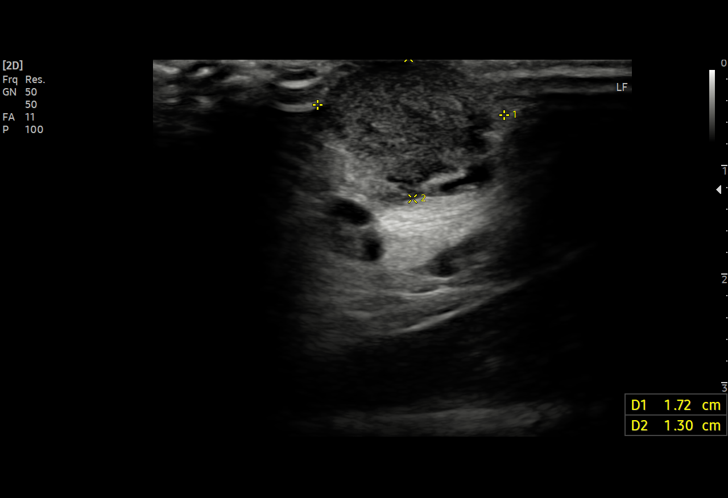
[im 14/15]
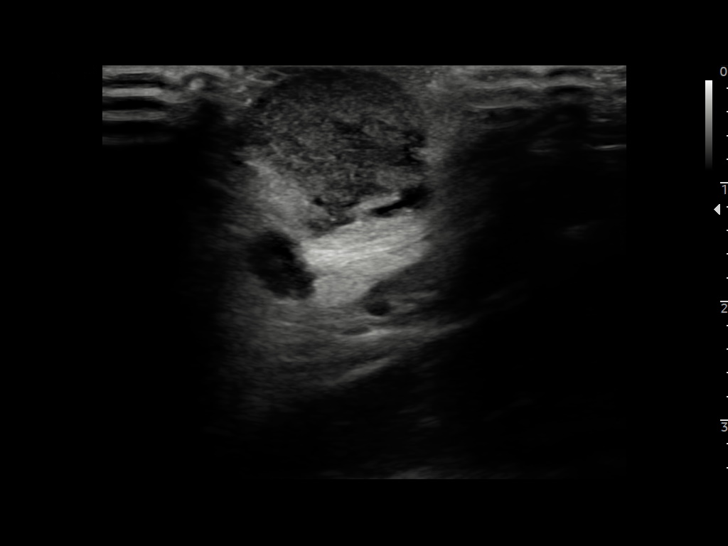
[im 15/15]
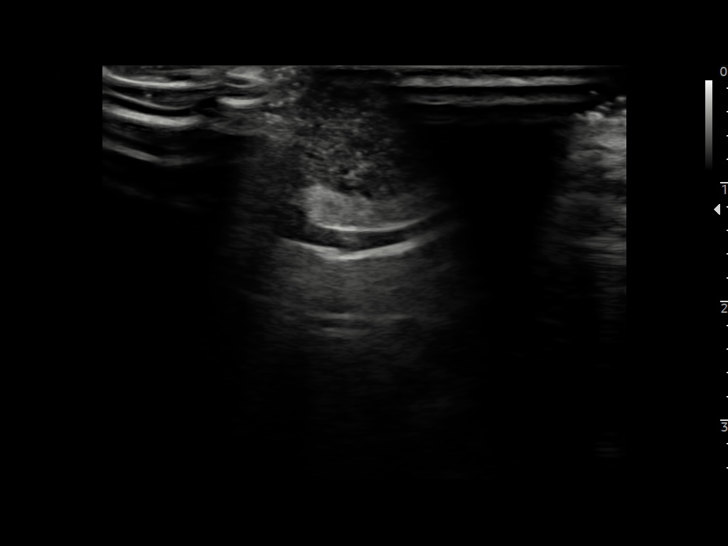

[15 of 15 positions shown; findings below may reference images not displayed]

FINDINGS: Sonographic evaluation of the patient's palpable area of concern
involving the superior aspect of the right-side of the neck
correlates with an approximately 1.8 x 1.7 x 1.3 cm mixed echogenic
hypoechoic subcutaneous nodule. This nodule appears to abut the
dermal surface.

Otherwise, there is no sonographic correlate for patient's palpable
area of concern. Specifically, no regional cervical lymphadenopathy.
IMPRESSION: Patient's palpable area of concern involving the superior aspect of
the right-side of the neck correlates with an approximately 1.8 cm
mixed echogenic subdermal subcutaneous nodule which given history of
metastatic malignancy may represent either a subcutaneous metastasis
(favored given presence of subcutaneous metastasis on abdominal CT
performed [DATE]) versus a malignant appearing lymph node.

Further evaluation with PET-CT imaging could be performed as
indicated.

## 2020-11-05 IMAGING — NM NM BONE WHOLE BODY
2 series · 2 of 2 positions shown · non-contrast
Comparison: None

Correlation: CT chest abdomen pelvis [DATE]

CLINICAL DATA: Metastatic disease of unknown primary, past history
melanoma; groin pain

EXAM:
NUCLEAR MEDICINE WHOLE BODY BONE SCAN
TECHNIQUE: Whole body anterior and posterior images were obtained approximately
3 hours after intravenous injection of radiopharmaceutical.
RADIOPHARMACEUTICALS:  21.8 mCi [9J] MDP IV

[Series 1: whole body half-speed · 2.66mm/px · 1 of 1 slices shown (1 of 2)]
[im 1/1]
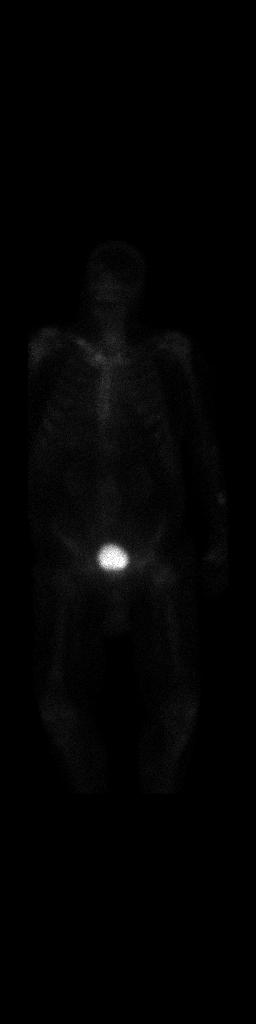

[Series 1: whole body half-speed · 2.66mm/px · 1 of 1 slices shown (2 of 2)]
[im 1/1]
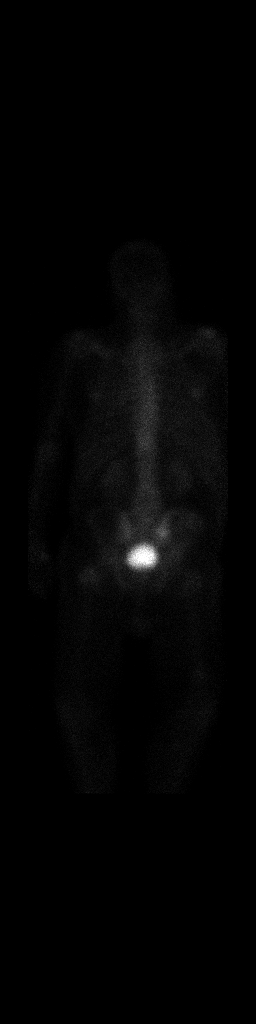

[2 of 2 positions shown; findings below may reference images not displayed]

FINDINGS: Uptake in lower thoracic spine at approximately T11, corresponding
to lytic lesion on CT.

Uptake seen at the RIGHT sacrum, anterior RIGHT iliac bone, less in
LEFT iliac, corresponding to metastatic lesions on CT.

Focus of abnormal uptake at the distal RIGHT femoral diaphysis
concerning for subtle metastasis recommend radiographic correlation.

Nonspecific tracer uptake at the intertrochanteric region of LEFT
femur without definite osseous lesion by CT.

Uptake at medial and mid RIGHT clavicle corresponding to metastases.

No additional worrisome sites of tracer accumulation.

Expected urinary tract and soft tissue distribution of tracer.
IMPRESSION: Multiple sites of abnormal tracer uptake consistent with osseous
metastases, predominantly corresponding with CT abnormalities.

Additional sites of uptake are seen at the distal RIGHT femoral
diaphysis and at the intertrochanteric region of the LEFT femur
concerning for metastases; recommend radiographic correlation of the
RIGHT femur to exclude lesion at risk of pathologic fracture.

## 2020-11-05 MED ORDER — ENOXAPARIN SODIUM 40 MG/0.4ML IJ SOSY
40.0000 mg | PREFILLED_SYRINGE | INTRAMUSCULAR | Status: DC
  Administered 2020-11-06 – 2020-11-13 (×8): 40 mg via SUBCUTANEOUS
  Filled 2020-11-05 (×8): qty 0.4

## 2020-11-05 MED ORDER — MIDAZOLAM HCL 2 MG/2ML IJ SOLN
INTRAMUSCULAR | Status: AC
  Filled 2020-11-05: qty 2

## 2020-11-05 MED ORDER — SODIUM CHLORIDE (PF) 0.9 % IJ SOLN
INTRAMUSCULAR | Status: AC
  Filled 2020-11-05: qty 50

## 2020-11-05 MED ORDER — LIDOCAINE HCL 1 % IJ SOLN
INTRAMUSCULAR | Status: AC
  Filled 2020-11-05: qty 20

## 2020-11-05 MED ORDER — LIDOCAINE-EPINEPHRINE 1 %-1:100000 IJ SOLN
INTRAMUSCULAR | Status: AC | PRN
  Administered 2020-11-05 (×2): 10 mL via INTRADERMAL

## 2020-11-05 MED ORDER — TECHNETIUM TC 99M MEDRONATE IV KIT
20.0000 | PACK | Freq: Once | INTRAVENOUS | Status: AC | PRN
  Administered 2020-11-05: 21.8 via INTRAVENOUS

## 2020-11-05 MED ORDER — SODIUM CHLORIDE 0.9 % IV SOLN
510.0000 mg | Freq: Once | INTRAVENOUS | Status: AC
  Administered 2020-11-05: 510 mg via INTRAVENOUS
  Filled 2020-11-05: qty 510

## 2020-11-05 MED ORDER — IOHEXOL 9 MG/ML PO SOLN
500.0000 mL | ORAL | Status: AC
  Administered 2020-11-05 (×2): 500 mL via ORAL

## 2020-11-05 MED ORDER — FENTANYL CITRATE (PF) 100 MCG/2ML IJ SOLN
INTRAMUSCULAR | Status: AC
  Filled 2020-11-05: qty 2

## 2020-11-05 MED ORDER — POLYETHYLENE GLYCOL 3350 17 G PO PACK
17.0000 g | PACK | Freq: Every day | ORAL | Status: DC
  Administered 2020-11-05 – 2020-11-13 (×7): 17 g via ORAL
  Filled 2020-11-05 (×8): qty 1

## 2020-11-05 MED ORDER — FENTANYL CITRATE (PF) 100 MCG/2ML IJ SOLN
INTRAMUSCULAR | Status: AC | PRN
  Administered 2020-11-05: 25 ug via INTRAVENOUS
  Administered 2020-11-05: 50 ug via INTRAVENOUS
  Administered 2020-11-05: 25 ug via INTRAVENOUS

## 2020-11-05 MED ORDER — CHLORHEXIDINE GLUCONATE CLOTH 2 % EX PADS
6.0000 | MEDICATED_PAD | Freq: Every day | CUTANEOUS | Status: DC
  Administered 2020-11-05 – 2020-11-13 (×8): 6 via TOPICAL

## 2020-11-05 MED ORDER — LIDOCAINE-EPINEPHRINE 1 %-1:100000 IJ SOLN
INTRAMUSCULAR | Status: AC
  Filled 2020-11-05: qty 1

## 2020-11-05 MED ORDER — IOHEXOL 9 MG/ML PO SOLN
ORAL | Status: AC
  Filled 2020-11-05: qty 1000

## 2020-11-05 MED ORDER — MIDAZOLAM HCL 2 MG/2ML IJ SOLN
INTRAMUSCULAR | Status: AC | PRN
  Administered 2020-11-05 (×3): 1 mg via INTRAVENOUS

## 2020-11-05 MED ORDER — LIDOCAINE HCL 1 % IJ SOLN
INTRAMUSCULAR | Status: AC | PRN
  Administered 2020-11-05: 10 mL via INTRADERMAL

## 2020-11-05 MED ORDER — IOHEXOL 300 MG/ML  SOLN
100.0000 mL | Freq: Once | INTRAMUSCULAR | Status: AC | PRN
  Administered 2020-11-05: 100 mL via INTRAVENOUS

## 2020-11-05 NOTE — Progress Notes (Signed)
MEDICATION-RELATED CONSULT NOTE   IR Procedure Consult - Anticoagulant/Antiplatelet PTA/Inpatient Med List Review by Pharmacist    Procedure: Placement of right IJ approach port-a-cath with tip at the superior caval atrial junction    Completed: 11/05/2020 17:52  Post-Procedural bleeding risk per IR MD assessment:  Standard Estimated Blood Loss: Minimal  Antithrombotic medications on inpatient or PTA profile prior to procedure:   Enoxaparin 40 mg subq q 24 hour    Recommended restart time per IR Post-Procedure Guidelines:   Day + 1 (Next AM)  Other considerations:   none   Plan:     Restart enoxaparin 40 mg subq q 24 hour tomorrow morning  Alonnah Lampkins P. Legrand Como, PharmD, [place] Please utilize Amion for appropriate phone number to reach the unit pharmacist ([place]) 11/05/2020 6:52 PM

## 2020-11-05 NOTE — Consult Note (Signed)
Referral MD  Reason for Referral: Metastatic malignancy-unknown primary-past history of melanoma.  Chief Complaint  Patient presents with  . Groin Pain  : Just had a lot of pain.  HPI: Mr. Gary Young is known to me.  I saw him about a week or so ago.  Is a 70 year old white male.  He actually had melanoma that was resected from his lower back about 6 years ago.  This was a deep melanoma.  He did not require any adjuvant therapy.  He subsequently showed up with a lot of pain.  He has a lot of bony pain.  He had x-rays that were done outside of the [facility] system.  He had a liver metastasis.  Had bony metastasis.  We got him on some pain medication.  We will put him on fentanyl patches.  We were set him up with biopsies and PET scan.  However, the pain got worse.  He went to the emergency room.  He had a ultrasound of his neck.  This showed a 1.8 x 1.7 x 1.3 cm echogenic subcutaneous nodule in the right side of the neck.  I think this correlates with the nodule that is palpable.  He had a chest x-ray done.  This showed a left infrahilar lesion with left lower lobe mass.  There is right hemidiaphragm.  There was no pleural effusion.  He had lab studies done.  His white cell count was 12.2.  Hemoglobin 8.7.  Platelet count 365,000.  His BUN was 22 creatinine 0.78.  His magnesium was 2.0.  His albumin is 2.7.  When we saw him in the office, he had marked iron deficiency with iron saturation of only 3%.  His LDH was 449.  He is on a PCA morphine pump right now.  He is more comfortable.  He does have some discomfort in the right shoulder.  He does have some pruritus.  There is no fever.  He has not noted any obvious bleeding.  Also of note, was his prealbumin of only 11 when we first saw him.  He has had no issues with nausea or vomiting.  His appetite has been a little bit decreased.  He has had no obvious fever.  He has had no headache.  Overall, I would say his performance status is  probably ECOG 1.    Past Medical History:  Diagnosis Date  . Asthma   . Cancer [facility])   :  Past Surgical History:  Procedure Laterality Date  . BACK SURGERY    . EYE SURGERY    . SKIN BIOPSY    . TONSILLECTOMY    :   Current Facility-Administered Medications:  .  acetaminophen (TYLENOL) tablet 650 mg, 650 mg, Oral, Q6H PRN, [place] Crane, Carole N, DO .  cholecalciferol (VITAMIN D) tablet 2,000 Units, 2,000 Units, Oral, Daily, Hall, Carole N, DO .  diphenhydrAMINE (BENADRYL) injection 12.5 mg, 12.5 mg, Intravenous, Q6H PRN **OR** diphenhydrAMINE (BENADRYL) 12.5 MG/5ML elixir 12.5 mg, 12.5 mg, Oral, Q6H PRN, Irene Pap N, DO, 12.5 mg at 11/04/20 2209 .  doxazosin (CARDURA) tablet 8 mg, 8 mg, Oral, QPM, Hall, Carole N, DO, 8 mg at 11/04/20 2023 .  enoxaparin (LOVENOX) injection 40 mg, 40 mg, Subcutaneous, Q24H, Hall, Carole N, DO, 40 mg at 11/04/20 2218 .  ferrous sulfate tablet 325 mg, 325 mg, Oral, Daily, Irene Pap N, DO, 325 mg at 11/04/20 2025 .  hydrOXYzine (ATARAX/VISTARIL) tablet 25 mg, 25 mg, Oral, TID PRN, Kayleen Memos, DO,  25 mg at 11/05/20 0646 .  lactated ringers infusion, , Intravenous, Continuous, Kayleen Memos, DO, Last Rate: 50 mL/hr at 11/05/20 0223, New Bag at 11/05/20 0223 .  latanoprost (XALATAN) 0.005 % ophthalmic solution 1 drop, 1 drop, Left Eye, QHS, Hall, [place] N, DO, 1 drop at 11/04/20 2304 .  metoprolol tartrate (LOPRESSOR) injection 2.5 mg, 2.5 mg, Intravenous, Q6H PRN, [place] Crane, Carole N, DO .  morphine 1 mg/mL PCA injection, , Intravenous, Q4H, Hall, Carole N, DO, Set-up / Initial Syringe at 11/05/20 0206 .  naloxone [facility]) injection 0.4 mg, 0.4 mg, Intravenous, PRN **AND** sodium chloride flush (NS) 0.9 % injection 9 mL, 9 mL, Intravenous, PRN, Hall, Carole N, DO .  ondansetron (ZOFRAN) injection 4 mg, 4 mg, Intravenous, Q6H PRN, Hall, Carole N, DO .  oxyCODONE (Oxy IR/ROXICODONE) immediate release tablet 5 mg, 5 mg, Oral, Q6H PRN, Irene Pap N, DO, 5 mg  at 11/04/20 2203 .  senna-docusate (Senokot-S) tablet 2 tablet, 2 tablet, Oral, BID, Hall, Carole N, DO .  timolol (TIMOPTIC) 0.5 % ophthalmic solution 1 drop, 1 drop, Both Eyes, Daily, Hall, Carole N, DO .  vitamin B-12 (CYANOCOBALAMIN) tablet 1,000 mcg, 1,000 mcg, Oral, Daily, Hall, Carole N, DO:  . cholecalciferol  2,000 Units Oral Daily  . doxazosin  8 mg Oral QPM  . enoxaparin (LOVENOX) injection  40 mg Subcutaneous Q24H  . ferrous sulfate  325 mg Oral Daily  . latanoprost  1 drop Left Eye QHS  . morphine   Intravenous Q4H  . senna-docusate  2 tablet Oral BID  . timolol  1 drop Both Eyes Daily  . cyanocobalamin  1,000 mcg Oral Daily  :  No Known Allergies:  History reviewed. No pertinent family history.:  Social History   Socioeconomic History  . Marital status: Married    Spouse name: Not on file  . Number of children: Not on file  . Years of education: Not on file  . Highest education level: Not on file  Occupational History  . Not on file  Tobacco Use  . Smoking status: Never Smoker  . Smokeless tobacco: Never Used  Vaping Use  . Vaping Use: Never used  Substance and Sexual Activity  . Alcohol use: Yes    Alcohol/week: 2.0 standard drinks    Types: 1 Glasses of wine, 1 Cans of beer per week    Comment: Socially  . Drug use: Never  . Sexual activity: Not on file  Other Topics Concern  . Not on file  Social History Narrative  . Not on file   Social Determinants of Health   Financial Resource Strain: Not on file  Food Insecurity: Not on file  Transportation Needs: Not on file  Physical Activity: Not on file  Stress: Not on file  Social Connections: Not on file  Intimate Partner Violence: Not on file  :  Review of Systems  Constitutional: Positive for malaise/fatigue.  HENT: Negative.  Negative for hearing loss.   Eyes: Negative.   Respiratory: Negative.   Cardiovascular: Negative.   Gastrointestinal: Negative.   Genitourinary: Negative.    Musculoskeletal: Positive for joint pain and myalgias.  Skin: Positive for itching.  Neurological: Negative.   Endo/Heme/Allergies: Negative.   Psychiatric/Behavioral: Negative.      Exam:  Physical Exam Vitals reviewed.  HENT:     Head: Normocephalic and atraumatic.  Eyes:     Pupils: Pupils are equal, round, and reactive to light.  Neck:     Comments:  It is neck, and the right neck, there is a nodule.  This is erythematous.  This is firm.  It is nontender.  It probably measures about a centimeter in size. Cardiovascular:     Rate and Rhythm: Normal rate and regular rhythm.     Heart sounds: Normal heart sounds.  Pulmonary:     Effort: Pulmonary effort is normal.     Breath sounds: Normal breath sounds.  Abdominal:     General: Bowel sounds are normal.     Palpations: Abdomen is soft.  Musculoskeletal:        General: No tenderness or deformity. Normal range of motion.     Cervical back: Normal range of motion.  Lymphadenopathy:     Cervical: No cervical adenopathy.  Skin:    General: Skin is warm and dry.     Findings: No erythema or rash.  Neurological:     Mental Status: He is alert and oriented to person, place, and time.  Psychiatric:        Behavior: Behavior normal.        Thought Content: Thought content normal.        Judgment: Judgment normal.    Patient Vitals for the past 24 hrs:  BP Temp Temp src Pulse Resp SpO2 Height Weight  11/05/20 0557 120/71 98.1 F (36.7 C) Oral (!) 106 16 99 % -- --  11/05/20 0421 -- -- -- -- 15 98 % -- --  11/05/20 0206 -- -- -- -- 17 98 % -- --  11/05/20 0136 124/85 98.4 F (36.9 C) Oral (!) 109 17 92 % -- 232 lb 5.8 oz (105.4 kg)  11/05/20 0000 130/86 -- -- (!) 116 20 (!) 88 % -- --  11/04/20 2345 137/89 -- -- (!) 117 (!) 21 97 % -- --  11/04/20 2300 112/77 -- -- (!) 117 17 100 % -- --  11/04/20 2200 140/86 -- -- (!) 112 17 91 % -- --  11/04/20 2023 123/82 -- -- -- -- -- -- --  11/04/20 1945 (!) 145/93 -- -- (!) 103  18 92 % -- --  11/04/20 1830 117/78 -- -- (!) 103 17 94 % -- --  11/04/20 1815 (!) 129/102 -- -- (!) 102 19 92 % -- --  11/04/20 1800 (!) 112/100 -- -- (!) 103 19 95 % -- --  11/04/20 1745 129/76 -- -- (!) 106 20 (!) 89 % -- --  11/04/20 1730 128/73 -- -- (!) 106 17 90 % -- --  11/04/20 1715 133/85 -- -- (!) 105 19 90 % -- --  11/04/20 1705 131/71 -- -- (!) 104 19 100 % -- --  11/04/20 1600 (!) 134/107 -- -- (!) 106 19 96 % -- --  11/04/20 1545 128/68 -- -- (!) 106 17 94 % -- --  11/04/20 1530 (!) 147/87 -- -- (!) 109 18 93 % -- --  11/04/20 1525 -- -- -- -- -- -- 6' (1.829 m) 233 lb (105.7 kg)  11/04/20 1524 (!) 150/84 99.5 F (37.5 C) Oral (!) 109 20 94 % -- --  11/04/20 1523 -- -- -- -- -- 93 % -- --  11/04/20 1519 -- -- -- -- -- 98 % -- --     Recent Labs    11/04/20 1547 11/05/20 0512  WBC 12.8* 12.2*  HGB 9.2* 8.7*  HCT 29.9* 28.6*  PLT 409* 365   Recent Labs    11/04/20 1547 11/05/20 0512  NA 131* 135  K 4.4 4.7  CL 93* 94*  CO2 28 34*  GLUCOSE 133* 133*  BUN 21 22  CREATININE 0.80 0.78  CALCIUM 9.2 9.2    Blood smear review: None  Pathology: None    Assessment and Plan: Mr. Patmon is a very nice 70 year old white male.  He has obvious metastatic disease.  This is quite extensive from the studies that were done outside of the [facility] system.  I have to believe that we are looking at melanoma.  He had a deep melanoma about 6 years ago.  Happily this is recurred.  I think that the biopsy to be done is going to be in the right neck.  He has this surface nodule.  It is significant.  This can be biopsied.  We should be able to get enough tissue out of this.  He does need to have staging studies done.  He was supposed to have a PET scan done on Friday.  However he will be in the hospital.  He will have a CT scan done.  This has been done with contrast since this is a staging scan for melanoma.  I will also get a bone scan on him.  I think this will be  important.  We probably will also need an MRI of the brain.  He will need to have a Port-A-Cath placed while he is in the hospital.  He is markedly iron deficient.  His stool needs to be checked.  He can certainly have GI blood loss.  I will give him some IV iron.  We will have to see how his blood work trends.  He actually may need to be transfused.  I know that the PCA is helping.  We will have to figure out how to convert him over to something oral.  He is on a fentanyl patch.  I am sure that palliative care might be able to help out with this.  I do appreciate the great help that he will get from all the staff up on 6 E.  Lattie Haw, MD  Jeneen Rinks 1:5

## 2020-11-05 NOTE — Progress Notes (Signed)
PROGRESS NOTE    Gary Young  EXN:[id number] DOB: December 08, 1950 DOA: 11/04/2020 PCP: [name], MD  Brief Narrative: Gary Young is a 78/M with h/o melanoma diagnosed 8 years ago and excised, seasonal allergies, recently diagnosed iron deficiency anemia, who was recently found to have metastatic disease on CT scan at outside facility involving his lungs, liver, and bones with unknown primary.  Onset of symptomatology on 10/11/20 when the patient noted that he was becoming significantly weak with minimal exertion, then developed pelvic pain, referred to orthopedic surgery where a CT abdomen and pelvis with contrast was done on 10/24/2020 revealing extensive metastatic disease affecting his left lower lobe lung, a lesion measuring 3.6 x 4.2 cm, bilateral pulmonary nodules, hepatic metastasis, retroperitoneal masses, soft tissue mass along the right psoas muscle, lytic lesions involving the thoracolumbar spine and bony pelvis.  -Then referred to oncology Dr. Marin Olp.  He was prescribed fentanyl patches and tramadol.  His pain has not been controlled.  Despite his pain management at home he is still having pain so severe he can barely move.  He called hematology oncology's office on the day of admission due to his uncontrolled pain.  Dr. Marin Olp recommended admission for pain control  Assessment & Plan:   Widely metastatic cancer -Previous history of melanoma resected 8 years ago, recent CT from 5/14 revealed extensive metastatic disease affecting his left lower lobe lung, and lesion measuring 3.6 x 4.2 cm.  Bilateral pulmonary nodules, hepatic metastasis, retroperitoneal masses, soft tissue mass along the right psoas muscle, lytic lesions involving the thoracolumbar spine and bony pelvis.   -Prior to admission was started on 2 fentanyl patches and using tramadol as needed -Continue to be in excruciating pain despite this, limiting any mobility -Currently on morphine PCA -Palliative team consulted as  well to assist with symptom control -Dr. Marin Olp following, plan for ultrasound-guided biopsy of neck nodule -CT for staging, Port-A-Cath also ordered, MRI brain  Intractable diffuse bones and joints pain -As above, currently on morphine PCA and fentanyl patch -Add laxatives  Hypovolemic hyponatremia -Improving, monitor  Moderate protein calorie malnutrition -Supplements as tolerated  Iron deficiency anemia, suspect related to metastatic disease, likely anemia of chronic disease. -Give IV iron   DVT prophylaxis: Subcu Lovenox daily Code Status: DNR  Family Communication: His wife at bedside  Disposition Plan:  Status is: Inpatient  Remains inpatient appropriate because:Inpatient level of care appropriate due to severity of illness   Dispo:  Patient From: Home  Planned Disposition: Home with Health Care Svc  Medically stable for discharge: No     Consultants:   Oncology, palliative care, IR   Procedures:   Antimicrobials:    Subjective: -Feels a bit better today, morphine PCA is helping  Objective: Vitals:   11/05/20 0421 11/05/20 0557 11/05/20 0730 11/05/20 0949  BP:  120/71  (!) 150/86  Pulse:  (!) 106  99  Resp: 15 16 18 18   Temp:  98.1 F (36.7 C)  98.4 F (36.9 C)  TempSrc:  Oral  Oral  SpO2: 98% 99% 95% 97%  Weight:      Height:        Intake/Output Summary (Last 24 hours) at 11/05/2020 1100 Last data filed at 11/05/2020 4967 Gross per 24 hour  Intake 297.49 ml  Output --  Net 297.49 ml   Filed Weights   11/04/20 1525 11/05/20 0136  Weight: 105.7 kg 105.4 kg    Examination:  General exam: Obese pleasant male, sitting up in bed,  AAOx3 HEENT: Right neck with nodule, pallor CVS: S1-S2, regular rate rhythm Lungs: Decreased breath sounds to bases Abdomen: Soft, nontender, bowel sounds present Extremities: No edema Neuro: Mild bilateral lower extremity weakness in part limited by pain Skin: No rashes on exposed skin  Data  Reviewed:   CBC: Recent Labs  Lab 11/04/20 1547 11/05/20 0512  WBC 12.8* 12.2*  NEUTROABS 10.0*  --   HGB 9.2* 8.7*  HCT 29.9* 28.6*  MCV 82.4 83.9  PLT 409* 119   Basic Metabolic Panel: Recent Labs  Lab 11/04/20 1547 11/05/20 0512  NA 131* 135  K 4.4 4.7  CL 93* 94*  CO2 28 34*  GLUCOSE 133* 133*  BUN 21 22  CREATININE 0.80 0.78  CALCIUM 9.2 9.2  MG  --  2.0  PHOS  --  4.5   GFR: Estimated Creatinine Clearance: 106.3 mL/min (by C-G formula based on SCr of 0.78 mg/dL). Liver Function Tests: Recent Labs  Lab 11/04/20 1547 11/05/20 0512  AST 30 29  ALT 26 23  ALKPHOS 109 105  BILITOT 0.6 0.3  PROT 5.9* 5.5*  ALBUMIN 2.8* 2.7*   No results for input(s): LIPASE, AMYLASE in the last 168 hours. No results for input(s): AMMONIA in the last 168 hours. Coagulation Profile: No results for input(s): INR, PROTIME in the last 168 hours. Cardiac Enzymes: No results for input(s): CKTOTAL, CKMB, CKMBINDEX, TROPONINI in the last 168 hours. BNP (last 3 results) No results for input(s): PROBNP in the last 8760 hours. HbA1C: No results for input(s): HGBA1C in the last 72 hours. CBG: No results for input(s): GLUCAP in the last 168 hours. Lipid Profile: No results for input(s): CHOL, HDL, LDLCALC, TRIG, CHOLHDL, LDLDIRECT in the last 72 hours. Thyroid Function Tests: No results for input(s): TSH, T4TOTAL, FREET4, T3FREE, THYROIDAB in the last 72 hours. Anemia Panel: No results for input(s): VITAMINB12, FOLATE, FERRITIN, TIBC, IRON, RETICCTPCT in the last 72 hours. Urine analysis:    Component Value Date/Time   COLORURINE YELLOW 11/04/2020 2200   APPEARANCEUR HAZY (A) 11/04/2020 2200   LABSPEC 1.026 11/04/2020 2200   PHURINE 5.0 11/04/2020 2200   GLUCOSEU NEGATIVE 11/04/2020 2200   HGBUR NEGATIVE 11/04/2020 2200   BILIRUBINUR NEGATIVE 11/04/2020 2200   KETONESUR 5 (A) 11/04/2020 2200   PROTEINUR NEGATIVE 11/04/2020 2200   NITRITE NEGATIVE 11/04/2020 2200    LEUKOCYTESUR NEGATIVE 11/04/2020 2200   Sepsis Labs: @LABRCNTIP (procalcitonin:4,lacticidven:4)  ) Recent Results (from the past 240 hour(s))  Resp Panel by RT-PCR (Flu A&B, Covid) Nasopharyngeal Swab     Status: None   Collection Time: 11/04/20  4:44 PM   Specimen: Nasopharyngeal Swab; Nasopharyngeal(NP) swabs in vial transport medium  Result Value Ref Range Status   SARS Coronavirus 2 by RT PCR NEGATIVE NEGATIVE Final    Comment: (NOTE) SARS-CoV-2 target nucleic acids are NOT DETECTED.  The SARS-CoV-2 RNA is generally detectable in upper respiratory specimens during the acute phase of infection. The lowest concentration of SARS-CoV-2 viral copies this assay can detect is 138 copies/mL. A negative result does not preclude SARS-Cov-2 infection and should not be used as the sole basis for treatment or other patient management decisions. A negative result may occur with  improper specimen collection/handling, submission of specimen other than nasopharyngeal swab, presence of viral mutation(s) within the areas targeted by this assay, and inadequate number of viral copies(<138 copies/mL). A negative result must be combined with clinical observations, patient history, and epidemiological information. The expected result is Negative.  Fact Sheet for  Patients:  EntrepreneurPulse.com.au  Fact Sheet for Healthcare Providers:  IncredibleEmployment.be  This test is no t yet approved or cleared by the [place] FDA and  has been authorized for detection and/or diagnosis of SARS-CoV-2 by FDA under an Emergency Use Authorization (EUA). This EUA will remain  in effect (meaning this test can be used) for the duration of the COVID-19 declaration under Section 564(b)(1) of the Act, 21 U.S.C.section 360bbb-3(b)(1), unless the authorization is terminated  or revoked sooner.       Influenza A by PCR NEGATIVE NEGATIVE Final   Influenza B by PCR NEGATIVE  NEGATIVE Final    Comment: (NOTE) The Xpert Xpress SARS-CoV-2/FLU/RSV plus assay is intended as an aid in the diagnosis of influenza from Nasopharyngeal swab specimens and should not be used as a sole basis for treatment. Nasal washings and aspirates are unacceptable for Xpert Xpress SARS-CoV-2/FLU/RSV testing.  Fact Sheet for Patients: EntrepreneurPulse.com.au  Fact Sheet for Healthcare Providers: IncredibleEmployment.be  This test is not yet approved or cleared by the [place] FDA and has been authorized for detection and/or diagnosis of SARS-CoV-2 by FDA under an Emergency Use Authorization (EUA). This EUA will remain in effect (meaning this test can be used) for the duration of the COVID-19 declaration under Section 564(b)(1) of the Act, 21 U.S.C. section 360bbb-3(b)(1), unless the authorization is terminated or revoked.  Performed at [facility], [place] [address], [place], [place] [address]          Radiology Studies: US SOFT TISSUE HEAD & NECK (NON-THYROID)  Result Date: 11/04/2020 CLINICAL DATA:  History of metastatic disease, now with palpable abnormality involving the right neck. EXAM: ULTRASOUND OF HEAD/NECK SOFT TISSUES TECHNIQUE: Ultrasound examination of the head and neck soft tissues was performed in the area of clinical concern. COMPARISON:  CT abdomen and pelvis-11/03/2020 FINDINGS: Sonographic evaluation of the patient's palpable area of concern involving the superior aspect of the right-side of the neck correlates with an approximately 1.8 x 1.7 x 1.3 cm mixed echogenic hypoechoic subcutaneous nodule. This nodule appears to abut the dermal surface. Otherwise, there is no sonographic correlate for patient's palpable area of concern. Specifically, no regional cervical lymphadenopathy. IMPRESSION: Patient's palpable area of concern involving the superior aspect of the right-side of the neck correlates with an  approximately 1.8 cm mixed echogenic subdermal subcutaneous nodule which given history of metastatic malignancy may represent either a subcutaneous metastasis (favored given presence of subcutaneous metastasis on abdominal CT performed 11/03/2020) versus a malignant appearing lymph node. Further evaluation with PET-CT imaging could be performed as indicated. Electronically Signed   By: Sandi Mariscal M.D.   On: 11/04/2020 16:24   DG Chest Port 1 View  Result Date: 11/04/2020 CLINICAL DATA:  Shoulder pain, recent cancer diagnosis with RIGHT shoulder pain. EXAM: PORTABLE CHEST 1 VIEW COMPARISON:  CT of the abdomen and pelvis from outside facility on Oct 24, 2020. FINDINGS: Trachea midline. Cardiomediastinal contours and hilar structures with LEFT infrahilar lobulation in the setting of known LEFT lower lobe mass. Elevation of the RIGHT hemidiaphragm as before. No lobar consolidation.  No gross pleural effusion. Suspected small nodule at the LEFT lung base as well. On limited assessment, no acute skeletal process. IMPRESSION: 1. No acute cardiopulmonary disease in the setting of LEFT infrahilar lobulation with known LEFT lower lobe mass. 2. Other small basilar nodules not well assessed. Electronically Signed   By: Zetta Bills M.D.   On: 11/04/2020 16:54  Scheduled Meds: . cholecalciferol  2,000 Units Oral Daily  . doxazosin  8 mg Oral QPM  . enoxaparin (LOVENOX) injection  40 mg Subcutaneous Q24H  . ferrous sulfate  325 mg Oral Daily  . iohexol      . latanoprost  1 drop Left Eye QHS  . morphine   Intravenous Q4H  . senna-docusate  2 tablet Oral BID  . timolol  1 drop Both Eyes Daily  . cyanocobalamin  1,000 mcg Oral Daily   Continuous Infusions: . lactated ringers 50 mL/hr at 11/05/20 0223     LOS: 0 days    Time spent: 95min  Domenic Polite, MD [facility] 11/05/2020, 11:00 AM

## 2020-11-05 NOTE — ED Notes (Signed)
Page sent to [facility], MD: pt was placed on 2.5L nasal cannula. No signs of respiratory distress.

## 2020-11-05 NOTE — Procedures (Signed)
Pre Procedure Dx: Poor venous access Post Procedural Dx: Same  Successful placement of right IJ approach port-a-cath with tip at the superior caval atrial junction. The catheter is ready for immediate use.  Estimated Blood Loss: Minimal  Complications: None immediate.  Jay Anessia [place], MD Pager #: [phone]   

## 2020-11-05 NOTE — Plan of Care (Signed)

## 2020-11-05 NOTE — Procedures (Signed)
Pre Procedure Dx: Metastatic disease of unknown primary Post Procedural Dx: Same  Technically successful US guided biopsy of indeterminate malignant appearing subcutaneous nodule within the right lateral abdominal wall.  EBL: None No immediate complications.   Ronny Bacon, MD Pager #: [phone]

## 2020-11-05 NOTE — Sedation Documentation (Signed)
Biopsy procedure complete. Patient being prepped for port a cath placement.

## 2020-11-05 NOTE — Progress Notes (Signed)
Upon admission Mr Breighner had two 59mcg Fentanyl patches to the right chest, these were removed and discarded and witnessed by Joesphine Bare RN

## 2020-11-05 NOTE — Sedation Documentation (Signed)
Patient is resting comfortably, snoring lightly, in NAD. 

## 2020-11-05 NOTE — Consult Note (Signed)
Chief Complaint: Patient was seen in consultation today for metastatic disease/right neck nodule biopsy and Port-a-cath placement.  Referring Physician(s): Volanda Napoleon (oncology)  Supervising Physician: Sandi Mariscal  Patient Status: [facility] - In-pt  History of Present Illness: Gary Young is a 70 y.o. male with a past medical history of asthma, melanoma diagnosed 6 years ago s/p deep excisional removal (no systemic treatment at that time as reported negative LN), and iron deficiency anemia. He presented to [facility] ED 11/04/2020 for worsening pain of bilateral shoulders/neck/groins. In summary, patient developed pain in these areas approximately 1 month ago. He was found to be anemic and underwent CT which revealed widespread metastatic disease (liver, bone, SQ metastases), unknown primary. He was referred to oncology on OP basis and met with Dr. Marin Olp who began OP work-up for this, however his pain became so severe that he was recommended by Dr. Marin Olp for admission of pain control/IP work-up of malignancy. He was admitted for further management, has upcoming NM PET (for today), and was started on PCA Morphine. He was seen by Dr. Marin Olp on IP basis who recommended IR consult for possible biopsy for tissue diagnosis, along with Port-a-cath placement for presumed systemic chemotherapy administration.  IR consulted by Dr. Marin Olp for possible image-guided right neck SQ nodule biopsy, along with image-guided Port-a-cath placement. Patient awake and alert laying in bed with no complaints at this time. Pain well controlled on PCA. Wife at bedside. Denies fever, chills, chest pain, dyspnea, abdominal pain, or headache.   Past Medical History:  Diagnosis Date  . Asthma   . Cancer [facility])     Past Surgical History:  Procedure Laterality Date  . BACK SURGERY    . EYE SURGERY    . SKIN BIOPSY    . TONSILLECTOMY      Allergies: Patient has no known allergies.  Medications: Prior to  Admission medications   Medication Sig Start Date End Date Taking? Authorizing Provider  albuterol (VENTOLIN HFA) 108 (90 Base) MCG/ACT inhaler Inhale into the lungs. 10/01/20  Yes [provider]  Cholecalciferol (VITAMIN D) 50 MCG (2000 UT) tablet Take 2,000 Units by mouth daily.   Yes [provider]  cyanocobalamin 1000 MCG tablet Take 1,000 mcg by mouth daily.   Yes [provider]  cyclobenzaprine (FLEXERIL) 10 MG tablet Take 1 tablet by mouth 3 (three) times daily as needed. 10/21/20  Yes [provider]  doxazosin (CARDURA) 8 MG tablet Take 8 mg by mouth daily. 02/06/20  Yes [provider]  fentaNYL (DURAGESIC) 25 MCG/HR Place 1 patch onto the skin every 3 (three) days. 10/27/20  Yes Volanda Napoleon, MD  ferrous sulfate 325 (65 FE) MG tablet Take 325 mg by mouth daily.   Yes [provider]  hydrOXYzine (ATARAX/VISTARIL) 25 MG tablet Take 1 tablet (25 mg total) by mouth 3 (three) times daily as needed. Patient taking differently: Take 25 mg by mouth 3 (three) times daily as needed for itching. 10/27/20  Yes Ennever, Rudell Cobb, MD  latanoprost (XALATAN) 0.005 % ophthalmic solution Place 1 drop into the left eye at bedtime. 10/01/20  Yes [provider]  timolol (TIMOPTIC) 0.5 % ophthalmic solution Place 1 drop into both eyes daily. 01/29/20  Yes [provider]  traMADol (ULTRAM) 50 MG tablet Take 50-100 mg by mouth 3 (three) times daily as needed for moderate pain. 10/26/20  Yes [provider]     History reviewed. No pertinent family history.  Social History  Socioeconomic History  . Marital status: Married    Spouse name: Not on file  . Number of children: Not on file  . Years of education: Not on file  . Highest education level: Not on file  Occupational History  . Not on file  Tobacco Use  . Smoking status: Never Smoker  . Smokeless tobacco: Never Used  Vaping Use  . Vaping Use: Never used   Substance and Sexual Activity  . Alcohol use: Yes    Alcohol/week: 2.0 standard drinks    Types: 1 Glasses of wine, 1 Cans of beer per week    Comment: Socially  . Drug use: Never  . Sexual activity: Not on file  Other Topics Concern  . Not on file  Social History Narrative  . Not on file   Social Determinants of Health   Financial Resource Strain: Not on file  Food Insecurity: Not on file  Transportation Needs: Not on file  Physical Activity: Not on file  Stress: Not on file  Social Connections: Not on file     Review of Systems: A 12 point ROS discussed and pertinent positives are indicated in the HPI above.  All other systems are negative.  Review of Systems  Constitutional: Negative for chills and fever.  Respiratory: Negative for shortness of breath and wheezing.   Cardiovascular: Negative for chest pain and palpitations.  Gastrointestinal: Negative for abdominal pain.  Neurological: Negative for headaches.  Psychiatric/Behavioral: Negative for behavioral problems and confusion.    Vital Signs: BP (!) 150/86 (BP Location: Left Arm)   Pulse 99   Temp 98.4 F (36.9 C) (Oral)   Resp 18   Ht 6' (1.829 m)   Wt 232 lb 5.8 oz (105.4 kg)   SpO2 97%   BMI 31.51 kg/m   Physical Exam Vitals and nursing note reviewed.  Constitutional:      General: He is not in acute distress. Cardiovascular:     Rate and Rhythm: Regular rhythm. Tachycardia present.     Heart sounds: Normal heart sounds. No murmur heard.   Pulmonary:     Effort: Pulmonary effort is normal. No respiratory distress.     Breath sounds: Normal breath sounds. No wheezing.  Musculoskeletal:     Comments: Right neck with palpable approximate quarter size nodule, no tenderness to palpation.  Skin:    General: Skin is warm and dry.  Neurological:     Mental Status: He is alert and oriented to person, place, and time.      MD Evaluation Airway: WNL Heart: WNL Abdomen: WNL Chest/ Lungs:  WNL ASA  Classification: 3 Mallampati/Airway Score: One   Imaging: US SOFT TISSUE HEAD & NECK (NON-THYROID)  Result Date: 11/04/2020 CLINICAL DATA:  History of metastatic disease, now with palpable abnormality involving the right neck. EXAM: ULTRASOUND OF HEAD/NECK SOFT TISSUES TECHNIQUE: Ultrasound examination of the head and neck soft tissues was performed in the area of clinical concern. COMPARISON:  CT abdomen and pelvis-11/03/2020 FINDINGS: Sonographic evaluation of the patient's palpable area of concern involving the superior aspect of the right-side of the neck correlates with an approximately 1.8 x 1.7 x 1.3 cm mixed echogenic hypoechoic subcutaneous nodule. This nodule appears to abut the dermal surface. Otherwise, there is no sonographic correlate for patient's palpable area of concern. Specifically, no regional cervical lymphadenopathy. IMPRESSION: Patient's palpable area of concern involving the superior aspect of the right-side of the neck correlates with an approximately 1.8 cm mixed echogenic subdermal subcutaneous nodule which  given history of metastatic malignancy may represent either a subcutaneous metastasis (favored given presence of subcutaneous metastasis on abdominal CT performed 11/03/2020) versus a malignant appearing lymph node. Further evaluation with PET-CT imaging could be performed as indicated. Electronically Signed   By: Sandi Mariscal M.D.   On: 11/04/2020 16:24   DG Chest Port 1 View  Result Date: 11/04/2020 CLINICAL DATA:  Shoulder pain, recent cancer diagnosis with RIGHT shoulder pain. EXAM: PORTABLE CHEST 1 VIEW COMPARISON:  CT of the abdomen and pelvis from outside facility on Oct 24, 2020. FINDINGS: Trachea midline. Cardiomediastinal contours and hilar structures with LEFT infrahilar lobulation in the setting of known LEFT lower lobe mass. Elevation of the RIGHT hemidiaphragm as before. No lobar consolidation.  No gross pleural effusion. Suspected small nodule at the  LEFT lung base as well. On limited assessment, no acute skeletal process. IMPRESSION: 1. No acute cardiopulmonary disease in the setting of LEFT infrahilar lobulation with known LEFT lower lobe mass. 2. Other small basilar nodules not well assessed. Electronically Signed   By: Zetta Bills M.D.   On: 11/04/2020 16:54   DG Hip Unilat W or Wo Pelvis 2-3 Views Right  Result Date: 10/13/2020 CLINICAL DATA:  Right hip pain EXAM: DG HIP (WITH OR WITHOUT PELVIS) 2-3V RIGHT COMPARISON:  None. FINDINGS: There is no evidence of hip fracture or dislocation. There is no evidence of arthropathy or other focal bone abnormality. IMPRESSION: Negative. Electronically Signed   By: Fidela Salisbury MD   On: 10/13/2020 21:47    Labs:  CBC: Recent Labs    10/27/20 1146 11/04/20 1547 11/05/20 0512  WBC 12.5* 12.8* 12.2*  HGB 9.5* 9.2* 8.7*  HCT 30.0* 29.9* 28.6*  PLT 383 409* 365    COAGS: No results for input(s): INR, APTT in the last 8760 hours.  BMP: Recent Labs    10/27/20 1146 11/04/20 1547 11/05/20 0512  NA 132* 131* 135  K 4.6 4.4 4.7  CL 94* 93* 94*  CO2 28 28 34*  GLUCOSE 130* 133* 133*  BUN 28* 21 22  CALCIUM 9.9 9.2 9.2  CREATININE 0.86 0.80 0.78  GFRNONAA >60 >60 >60    LIVER FUNCTION TESTS: Recent Labs    10/27/20 1146 11/04/20 1547 11/05/20 0512  BILITOT 0.4 0.6 0.3  AST _0 ALT _1 ALKPHOS 97 109 105  PROT 6.1* 5.9* 5.5*  ALBUMIN 3.9 2.8* 2.7*     Assessment and Plan:  History of melanoma (diagnosed 6 years ago s/p deep excisional removal, no systemic treatment at that time as reported negative LN) who presented with widespread metastatic disease (liver, bone, SQ metastases) with unknown primary. Plan for image-guided right neck nodule biopsy along with image-guided Port-a-cath placement in IR tentatively for today pending IR scheduling. Patient is NPO. Afebrile.  Risks and benefits discussed with the patient including, but not limited to bleeding,  infection, damage to adjacent structures or low yield requiring additional tests. All of the patient's questions were answered, patient is agreeable to proceed. Consent signed and in chart.  Risks and benefits of image-guided Port-a-catheter placement were discussed with the patient including, but not limited to bleeding, infection, pneumothorax, or fibrin sheath development and need for additional procedures. All of the patient's questions were answered, patient is agreeable to proceed. Consent signed and in chart.   Thank you for this interesting consult.  I greatly enjoyed meeting Altin Sease and look forward to participating in their care.  A copy  of this report was sent to the requesting provider on this date.  Electronically Signed: Earley Abide, PA-C 11/05/2020, 10:26 AM   I spent a total of 40 Minutes in face to face in clinical consultation, greater than 50% of which was counseling/coordinating care for metastatic disease/right neck nodule biopsy and Port-a-cath placement.

## 2020-11-05 NOTE — Sedation Documentation (Signed)
Port a cath placement procedure begun. 

## 2020-11-06 ENCOUNTER — Ambulatory Visit (HOSPITAL_COMMUNITY): Admission: RE | Admit: 2020-11-06 | Payer: Medicare HMO | Source: Ambulatory Visit

## 2020-11-06 ENCOUNTER — Inpatient Hospital Stay (HOSPITAL_COMMUNITY): Payer: Medicare HMO

## 2020-11-06 DIAGNOSIS — C7951 Secondary malignant neoplasm of bone: Secondary | ICD-10-CM | POA: Diagnosis not present

## 2020-11-06 DIAGNOSIS — G893 Neoplasm related pain (acute) (chronic): Secondary | ICD-10-CM | POA: Diagnosis not present

## 2020-11-06 DIAGNOSIS — Z7189 Other specified counseling: Secondary | ICD-10-CM

## 2020-11-06 DIAGNOSIS — Z515 Encounter for palliative care: Secondary | ICD-10-CM

## 2020-11-06 DIAGNOSIS — C439 Malignant melanoma of skin, unspecified: Secondary | ICD-10-CM

## 2020-11-06 DIAGNOSIS — R221 Localized swelling, mass and lump, neck: Secondary | ICD-10-CM

## 2020-11-06 DIAGNOSIS — Z85820 Personal history of malignant melanoma of skin: Secondary | ICD-10-CM | POA: Diagnosis not present

## 2020-11-06 DIAGNOSIS — R52 Pain, unspecified: Secondary | ICD-10-CM | POA: Diagnosis not present

## 2020-11-06 DIAGNOSIS — C801 Malignant (primary) neoplasm, unspecified: Secondary | ICD-10-CM | POA: Diagnosis not present

## 2020-11-06 LAB — CBC WITH DIFFERENTIAL/PLATELET
Abs Immature Granulocytes: 0.12 10*3/uL — ABNORMAL HIGH (ref 0.00–0.07)
Basophils Absolute: 0.1 10*3/uL (ref 0.0–0.1)
Basophils Relative: 1 %
Eosinophils Absolute: 0.5 10*3/uL (ref 0.0–0.5)
Eosinophils Relative: 4 %
HCT: 28.9 % — ABNORMAL LOW (ref 39.0–52.0)
Hemoglobin: 8.8 g/dL — ABNORMAL LOW (ref 13.0–17.0)
Immature Granulocytes: 1 %
Lymphocytes Relative: 8 %
Lymphs Abs: 1.2 10*3/uL (ref 0.7–4.0)
MCH: 25.7 pg — ABNORMAL LOW (ref 26.0–34.0)
MCHC: 30.4 g/dL (ref 30.0–36.0)
MCV: 84.5 fL (ref 80.0–100.0)
Monocytes Absolute: 1.7 10*3/uL — ABNORMAL HIGH (ref 0.1–1.0)
Monocytes Relative: 13 %
Neutro Abs: 10.1 10*3/uL — ABNORMAL HIGH (ref 1.7–7.7)
Neutrophils Relative %: 73 %
Platelets: 359 10*3/uL (ref 150–400)
RBC: 3.42 MIL/uL — ABNORMAL LOW (ref 4.22–5.81)
RDW: 16.1 % — ABNORMAL HIGH (ref 11.5–15.5)
WBC: 13.7 10*3/uL — ABNORMAL HIGH (ref 4.0–10.5)
nRBC: 0 % (ref 0.0–0.2)

## 2020-11-06 LAB — RETICULOCYTES
Immature Retic Fract: 34.8 % — ABNORMAL HIGH (ref 2.3–15.9)
RBC.: 3.44 MIL/uL — ABNORMAL LOW (ref 4.22–5.81)
Retic Count, Absolute: 96.3 10*3/uL (ref 19.0–186.0)
Retic Ct Pct: 2.8 % (ref 0.4–3.1)

## 2020-11-06 LAB — COMPREHENSIVE METABOLIC PANEL
ALT: 23 U/L (ref 0–44)
AST: 26 U/L (ref 15–41)
Albumin: 2.5 g/dL — ABNORMAL LOW (ref 3.5–5.0)
Alkaline Phosphatase: 102 U/L (ref 38–126)
Anion gap: 11 (ref 5–15)
BUN: 20 mg/dL (ref 8–23)
CO2: 29 mmol/L (ref 22–32)
Calcium: 9.3 mg/dL (ref 8.9–10.3)
Chloride: 93 mmol/L — ABNORMAL LOW (ref 98–111)
Creatinine, Ser: 0.57 mg/dL — ABNORMAL LOW (ref 0.61–1.24)
GFR, Estimated: >60 mL/min (ref >60)
Glucose, Bld: 106 mg/dL — ABNORMAL HIGH (ref 70–99)
Potassium: 4.4 mmol/L (ref 3.5–5.1)
Sodium: 133 mmol/L — ABNORMAL LOW (ref 135–145)
Total Bilirubin: 0.7 mg/dL (ref 0.3–1.2)
Total Protein: 5.3 g/dL — ABNORMAL LOW (ref 6.5–8.1)

## 2020-11-06 LAB — LACTATE DEHYDROGENASE: LDH: 312 U/L — ABNORMAL HIGH (ref 98–192)

## 2020-11-06 IMAGING — MR MR THORACIC SPINE WO/W CM
9 of 18 series · 21 of 48 positions shown · IV contrast (gadavist)
Comparison: None.

CLINICAL DATA: Metastatic disease.

EXAM:
MRI THORACIC WITHOUT AND WITH CONTRAST
TECHNIQUE: Multiplanar and multiecho pulse sequences of the thoracic spine were
obtained without and with intravenous contrast.
CONTRAST:  10mL GADAVIST GADOBUTROL 1 MMOL/ML IV SOLN

[Series 21: T1 · sagittal · 4.0mm · 1.84mm/px · 1 of 5 slices shown (1 of 3)]
[im 1/5]
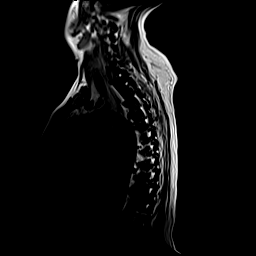

[Series 22: T1 · sagittal · 3.0mm · 1.06mm/px · 1 of 15 slices shown (2 of 3)]
[im 1/15]
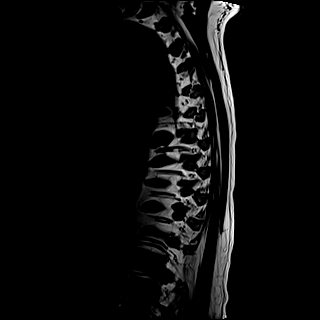

[Series 23: STIR · sagittal · 3.0mm · 1.06mm/px · 1 of 17 slices shown]
[im 1/17]
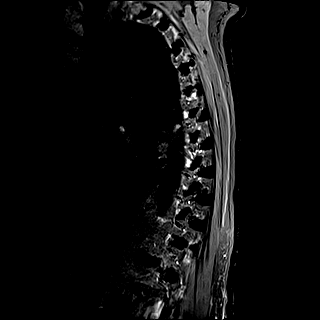

[Series 24: T2 post-contrast · sagittal · 3.0mm · 0.89mm/px · 1 of 15 slices shown]
[im 1/15]
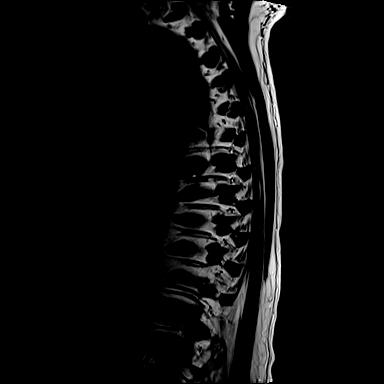

[Series 25: T2 · axial · 4.0mm · 0.78mm/px · z∈[-510,-284]mm · 4 of 49 slices shown]
[im 1/49]
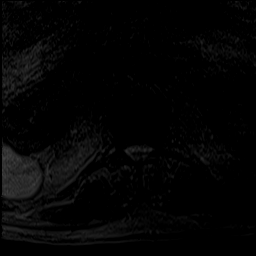
[im 17/49]
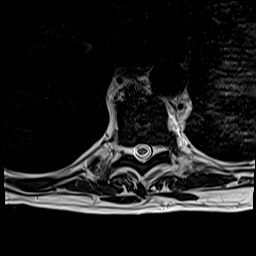
[im 33/49]
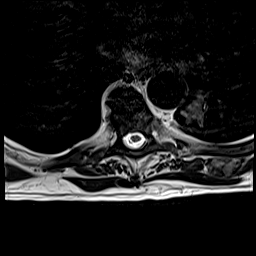
[im 49/49]
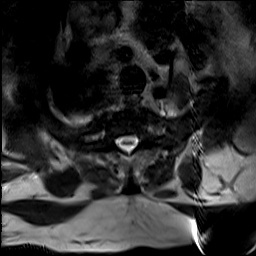

[Series 26: t2_me2d_tra · axial · 4.0mm · 0.39mm/px · z∈[-510,-284]mm · 4 of 49 slices shown]
[im 1/49]
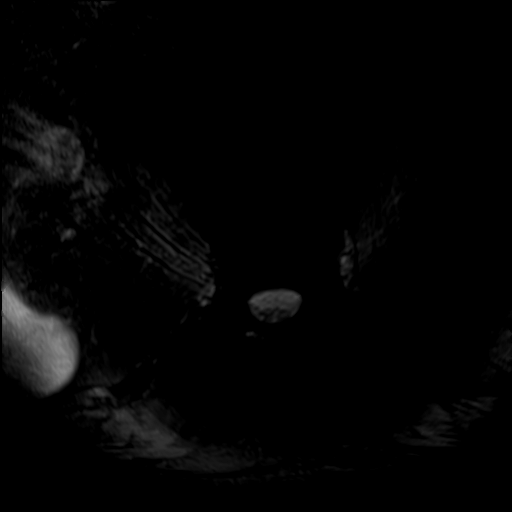
[im 17/49]
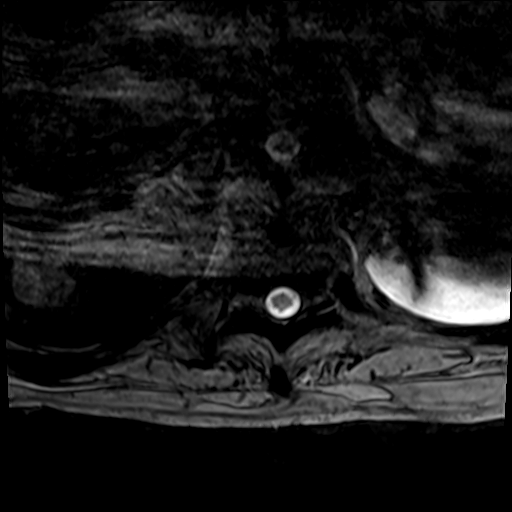
[im 33/49]
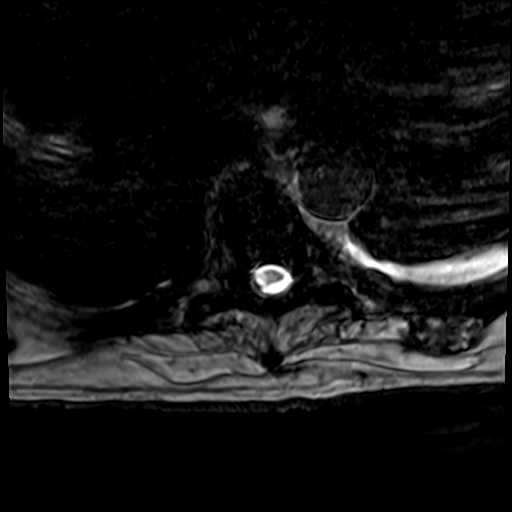
[im 49/49]
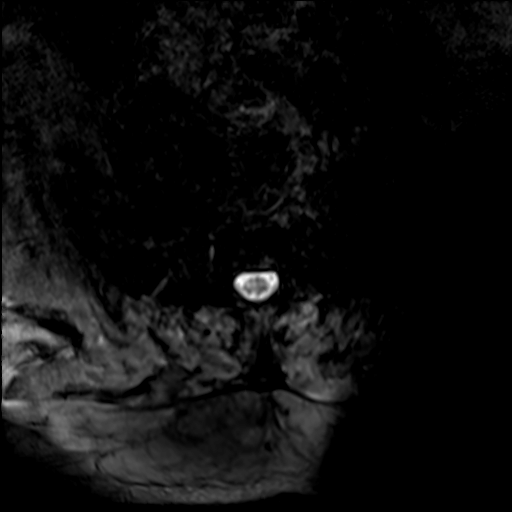

[Series 27: T1 · axial · 4.0mm · 0.39mm/px · z∈[-510,-284]mm · 4 of 49 slices shown (3 of 3)]
[im 1/49]
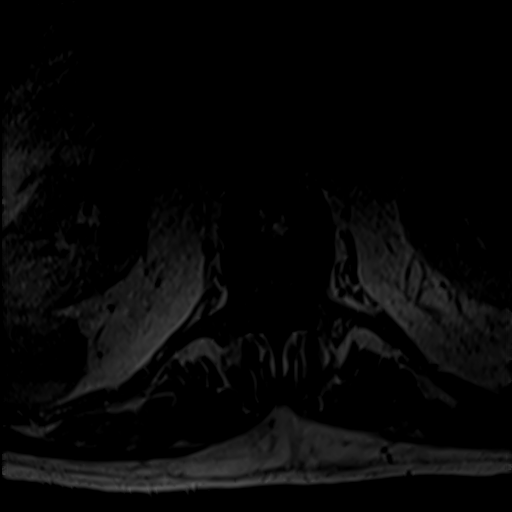
[im 17/49]
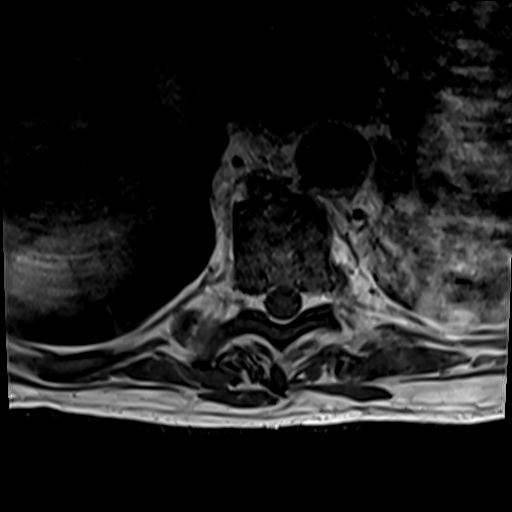
[im 33/49]
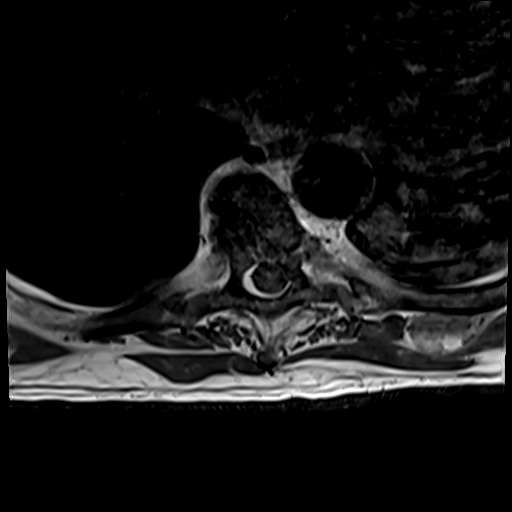
[im 49/49]
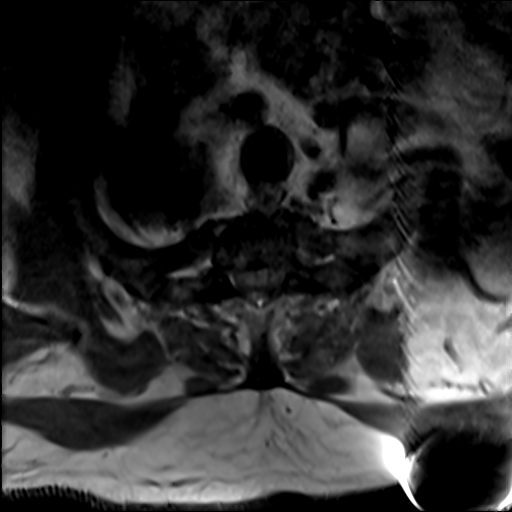

[Series 28: T1 fat-sat post-contrast · sagittal · 3.0mm · 1.06mm/px · 1 of 15 slices shown]
[im 1/15]
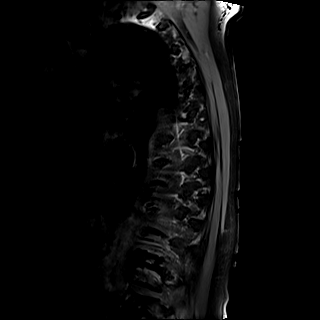

[Series 29: T1 post-contrast · axial · 4.0mm · 0.39mm/px · z∈[-510,-284]mm · 4 of 49 slices shown]
[im 1/49]
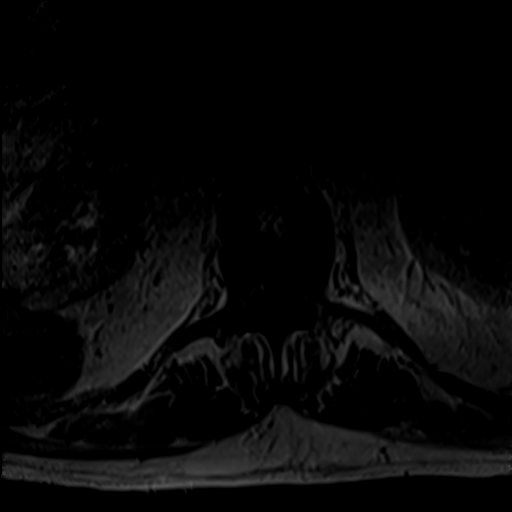
[im 17/49]
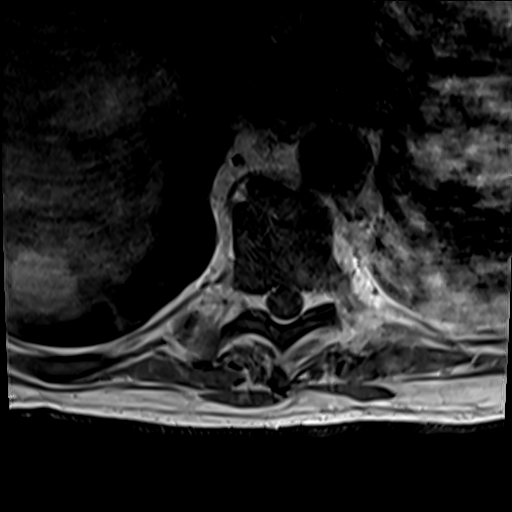
[im 33/49]
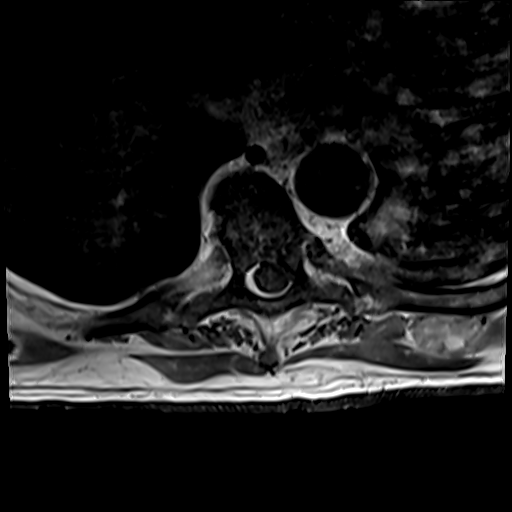
[im 49/49]
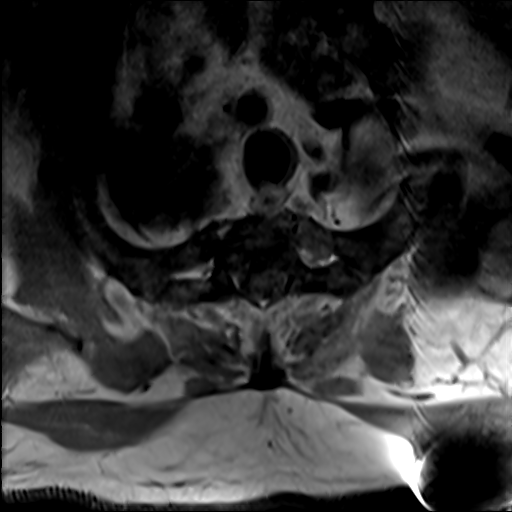

[21 of 48 positions shown; findings below may reference images not displayed]

FINDINGS: Alignment:  Normal.

Vertebrae: There is a large enhancing lesion in the T11 vertebral
body which involves the majority of the vertebral body. This lesion
has intrinsic T1 hyperintensity. There is left greater than right
ventral epidural extension of tumor which results in moderate left
eccentric canal stenosis with flattening of the left ventral cord.
Additionally, there is extension of tumor to involve the left
posterior elements, including the left pedicle.

There are additional metastatic lesions at multiple thoracic levels,
including multiple lesions at the T7 (largest lesion measuring
cm) mild, T9 (largest lesion measuring 1.1 cm), T12 (largest lesion
measuring 1.1 cm) and L1 (partially imaged) levels. Additional
small lesion at T10 and suspected multiple small lesions at T5.

Cord:  Normal cord signal.

Paraspinal and other soft tissues: Suspected slight extension of
tumor at the T11 level into the adjacent left paraspinal soft
tissues. Please see CT chest abdomen pelvis from [DATE],[DATE]
for characterization of additional extra-spinal findings.

Disc levels:

Stenosis at the T11 and T11-T12 level due to osseous metastatic
disease is detailed above. Mild bilateral foraminal stenosis at
T10-T11 due to degenerative change. Otherwise, no evidence of
significant canal stenosis or foraminal stenosis.
IMPRESSION: Multifocal osseous metastases throughout the thoracic spine, as
detailed above. At T11 the metastasis replaces the majority of the
vertebral body and extends into the posterior elements and the
ventral epidural space with resulting moderate canal stenosis,
flattening of the left ventral cord and mild left foraminal
stenosis.

## 2020-11-06 IMAGING — MR MR HEAD WO/W CM
13 series · 48 of 48 positions shown · IV contrast (gadavist)
Comparison: None.

CLINICAL DATA: Recurrent melanoma.

EXAM:
MRI HEAD WITHOUT AND WITH CONTRAST
TECHNIQUE: Multiplanar, multiecho pulse sequences of the brain and surrounding
structures were obtained without and with intravenous contrast.
CONTRAST:  10mL GADAVIST GADOBUTROL 1 MMOL/ML IV SOLN

[Series 5: DWI · axial · 3.0mm · 1.36mm/px · z∈[-69,+81]mm · 5 of 104 slices shown (1 of 2)]
[im 1/104]
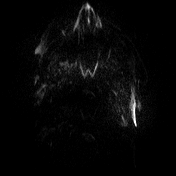
[im 26/104]
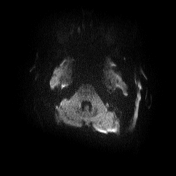
[im 52/104]
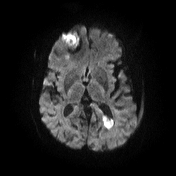
[im 78/104]
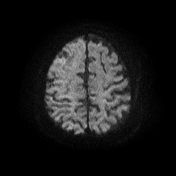
[im 104/104]
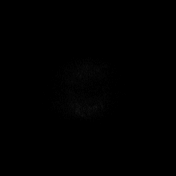

[Series 6: DWI · axial · 3.0mm · 1.36mm/px · z∈[-69,+78]mm · 3 of 51 slices shown (2 of 2)]
[im 1/51]
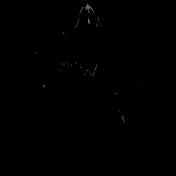
[im 26/51]
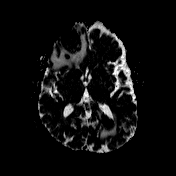
[im 51/51]
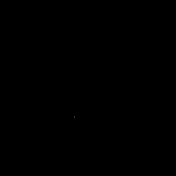

[Series 7: T1 · sagittal · 5.0mm · 0.75mm/px · 1 of 24 slices shown (1 of 2)]
[im 1/24]
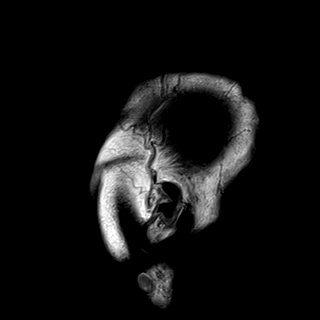

[Series 8: T2 · axial · 5.0mm · 0.62mm/px · z∈[-74,+86]mm · 2 of 26 slices shown]
[im 1/26]
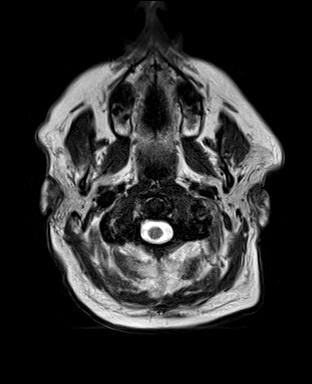
[im 26/26]
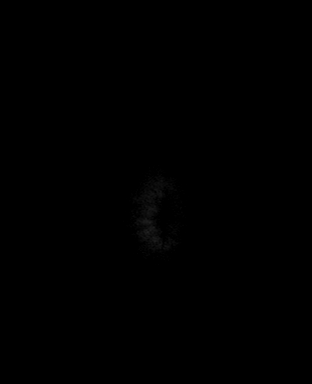

[Series 9: swi_images · axial · 3.0mm · 0.75mm/px · z∈[-81,+93]mm · 4 of 60 slices shown]
[im 1/60]
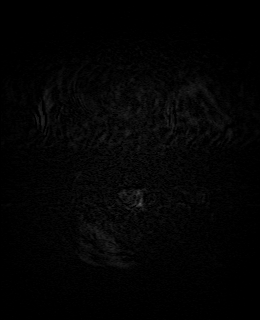
[im 20/60]
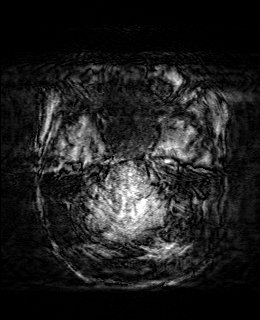
[im 40/60]
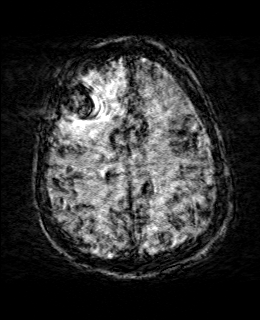
[im 60/60]
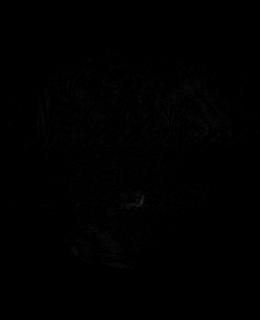

[Series 11: FLAIR · axial · 3.0mm · 0.75mm/px · z∈[-69,+81]mm · 3 of 52 slices shown]
[im 1/52]
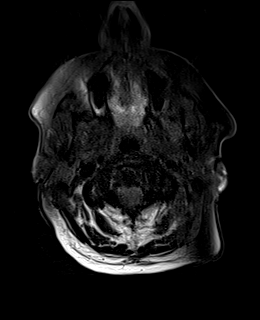
[im 26/52]
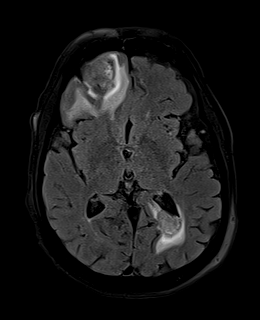
[im 52/52]
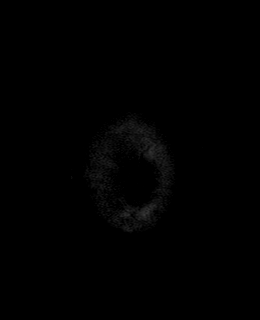

[Series 12: T1 · axial · 1.0mm · 0.94mm/px · z∈[-72,+84]mm · 10 of 160 slices shown (2 of 2)]
[im 1/160]
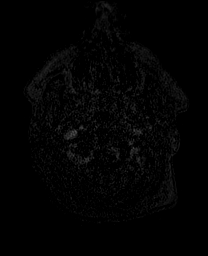
[im 18/160]
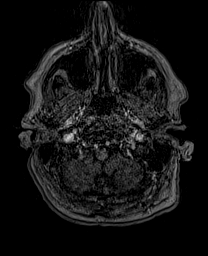
[im 36/160]
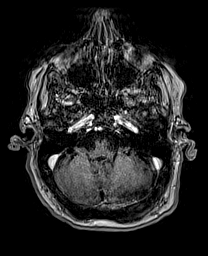
[im 54/160]
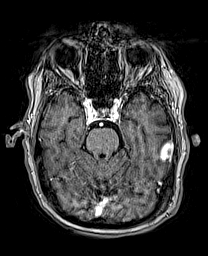
[im 71/160]
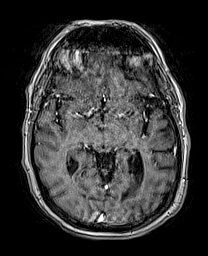
[im 89/160]
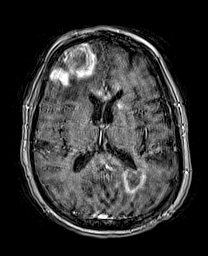
[im 107/160]
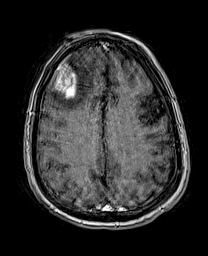
[im 124/160]
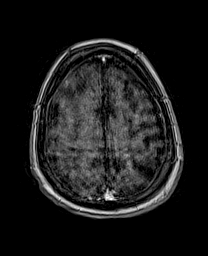
[im 142/160]
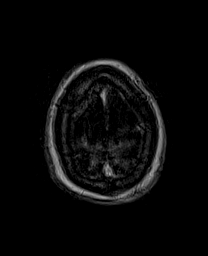
[im 160/160]
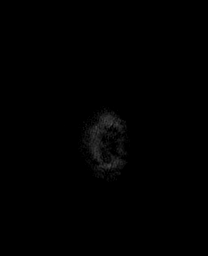

[Series 13: cor dwi_tracew · coronal · 5.0mm · 1.53mm/px · 3 of 58 slices shown]
[im 1/58]
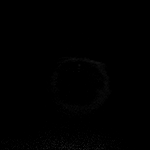
[im 29/58]
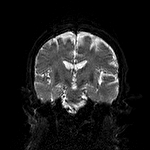
[im 58/58]
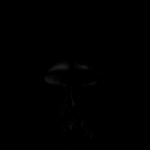

[Series 14: cor dwi_adc · coronal · 5.0mm · 1.53mm/px · 2 of 28 slices shown]
[im 1/28]
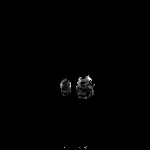
[im 28/28]
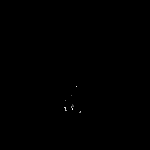

[Series 15: T2 post-contrast · coronal · 5.0mm · 0.57mm/px · 2 of 29 slices shown]
[im 1/29]
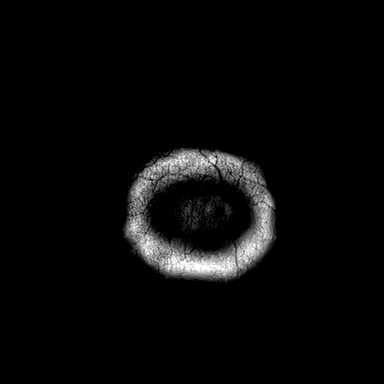
[im 29/29]
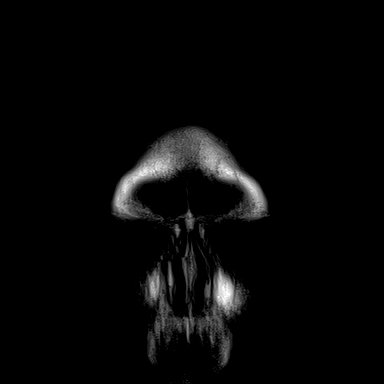

[Series 16: T1 post-contrast · axial · 1.0mm · 0.94mm/px · z∈[-72,+84]mm · 10 of 160 slices shown (1 of 3)]
[im 1/160]
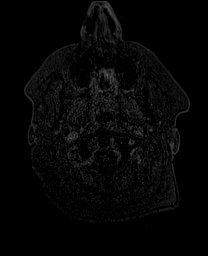
[im 18/160]
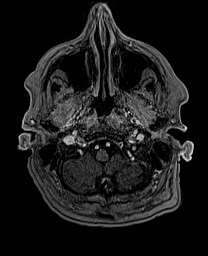
[im 36/160]
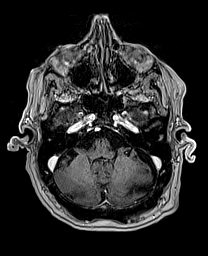
[im 54/160]
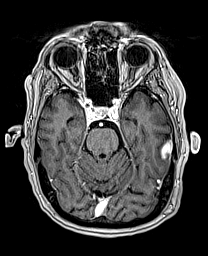
[im 71/160]
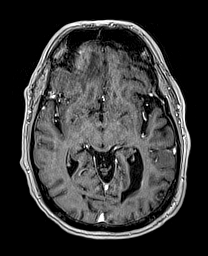
[im 89/160]
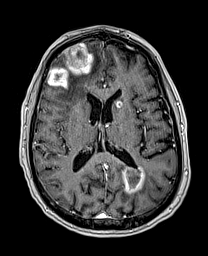
[im 107/160]
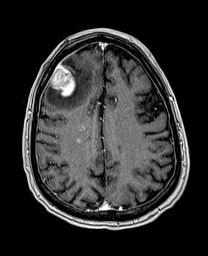
[im 124/160]
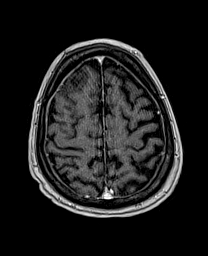
[im 142/160]
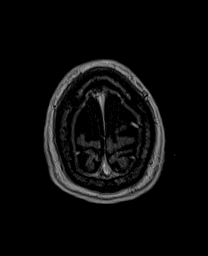
[im 160/160]
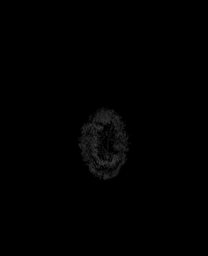

[Series 17: T1 post-contrast · coronal · 5.0mm · 0.43mm/px · 2 of 29 slices shown (2 of 3)]
[im 1/29]
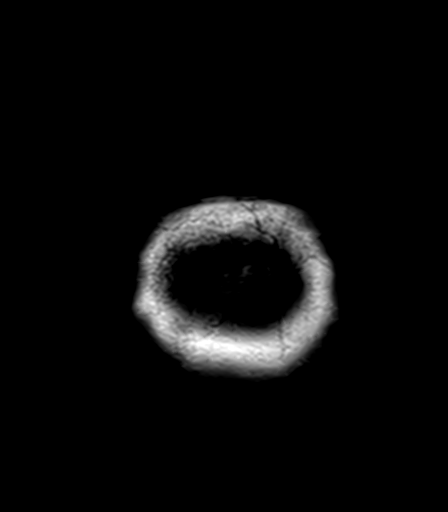
[im 29/29]
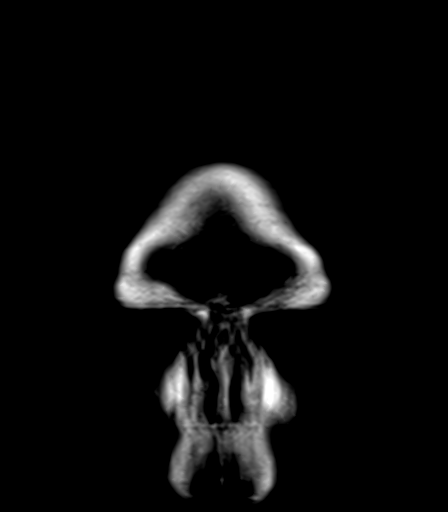

[Series 18: T1 post-contrast · sagittal · 5.0mm · 0.75mm/px · 1 of 24 slices shown (3 of 3)]
[im 1/24]
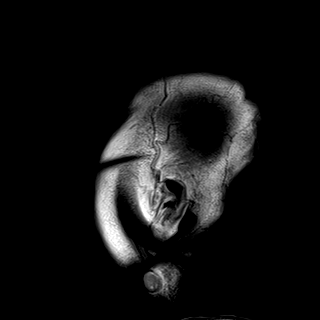

[48 of 48 positions shown; findings below may reference images not displayed]

FINDINGS: Brain: Multiple hemorrhagic metastases are present.

Anterior right frontal lobe lesion measures 3.2 x 2.5 x 2.9 cm. An
adjacent nonhemorrhagic lesion measures 11 mm. An additional
hemorrhagic lesion more superior and posterior to the first lesion
described measures 3.4 x 2.9 x 2.4 cm. There is extensive edema
surrounding the constellation of lesions.

Two nonhemorrhagic lesions within the right corona radiata measure 4
and 6 mm respectively.

Hemorrhagic lesion involves the posterior left lateral ventricle,
likely centered on the ependyma. Adjacent left occipital lesion
measures 12 mm. A more posterior hemorrhagic occipital lesion
measures 6 mm.

Hemorrhagic lesion in the left caudate head measures 6 mm.

A hemorrhagic lesion in the lateral left temporal lobe measures 16 x
13 x 17 mm.

No lesions are present in the posterior fossa.

Vasogenic edema is most prominent in the right frontal lobe with
effacement the sulci. There is edema in the posterior left lateral
ventricle and left occipital lesions. There is also surrounding
vasogenic edema of the left temporal lobe lesion. Mild generalized
atrophy is present. There is some T2 signal associated with the
lesions of the right corona radiata.

Internal auditory canals are within normal limits bilaterally.

Vascular: Flow is present in the major intracranial arteries.

Skull and upper cervical spine: The craniocervical junction is
normal. Upper cervical spine is within normal limits. Marrow signal
is unremarkable.

Sinuses/Orbits: Small mastoid effusions are present bilaterally
without enhancement. No obstructing nasopharyngeal lesion is
present. The paranasal sinuses and mastoid air cells are otherwise
clear. A right lens replacement is present. Globes and orbits are
otherwise within normal limits.
IMPRESSION: 1. Multiple bilateral brain metastases. Hemorrhagic lesions are
present in the anterior right frontal lobe, left parietoccipital
lobe, and left temporal lobe with surrounding vasogenic edema is
described. None hemorrhagic lesions are present in the right frontal
lobe and right corona radiata.
2. Mild generalized atrophy.
3. Small mastoid effusions bilaterally without enhancement. No
obstructing nasopharyngeal lesion is present.

## 2020-11-06 MED ORDER — ADULT MULTIVITAMIN W/MINERALS CH
1.0000 | ORAL_TABLET | Freq: Every day | ORAL | Status: DC
  Administered 2020-11-06 – 2020-11-13 (×8): 1 via ORAL
  Filled 2020-11-06 (×8): qty 1

## 2020-11-06 MED ORDER — DEXAMETHASONE SODIUM PHOSPHATE 4 MG/ML IJ SOLN: 40.0000 mg | Freq: Once | INTRAMUSCULAR | Status: DC

## 2020-11-06 MED ORDER — METOPROLOL TARTRATE 25 MG PO TABS
25.0000 mg | ORAL_TABLET | Freq: Two times a day (BID) | ORAL | Status: DC
  Administered 2020-11-06 – 2020-11-13 (×15): 25 mg via ORAL
  Filled 2020-11-06 (×15): qty 1

## 2020-11-06 MED ORDER — ENSURE ENLIVE PO LIQD
237.0000 mL | Freq: Two times a day (BID) | ORAL | Status: DC
  Administered 2020-11-06 – 2020-11-13 (×8): 237 mL via ORAL

## 2020-11-06 MED ORDER — SODIUM CHLORIDE 0.9 % IV SOLN
40.0000 mg | Freq: Once | INTRAVENOUS | Status: AC
  Administered 2020-11-06: 40 mg via INTRAVENOUS
  Filled 2020-11-06: qty 4

## 2020-11-06 MED ORDER — MORPHINE SULFATE 1 MG/ML IV SOLN PCA
INTRAVENOUS | Status: DC
  Administered 2020-11-06: 2.26 mg via INTRAVENOUS
  Administered 2020-11-06: 4.27 mg via INTRAVENOUS
  Administered 2020-11-06: 5.18 mg via INTRAVENOUS
  Administered 2020-11-07: 13.77 mg via INTRAVENOUS
  Administered 2020-11-07: 2.24 mg via INTRAVENOUS
  Administered 2020-11-07: 5.22 mg via INTRAVENOUS
  Administered 2020-11-07: 1.33 mg via INTRAVENOUS
  Administered 2020-11-07: 2.58 mg via INTRAVENOUS
  Administered 2020-11-08: 7.16 mg via INTRAVENOUS
  Administered 2020-11-08: 1.17 mL via INTRAVENOUS
  Administered 2020-11-08: 6.3 mL via INTRAVENOUS
  Administered 2020-11-08: 3.72 mg via INTRAVENOUS
  Administered 2020-11-08: 4.39 mg via INTRAVENOUS
  Administered 2020-11-08: 6.74 mg via INTRAVENOUS
  Administered 2020-11-09: 5.12 mg via INTRAVENOUS
  Filled 2020-11-06 (×4): qty 30

## 2020-11-06 MED ORDER — LABETALOL HCL 5 MG/ML IV SOLN
20.0000 mg | INTRAVENOUS | Status: DC | PRN
  Administered 2020-11-09: 20 mg via INTRAVENOUS
  Filled 2020-11-06 (×2): qty 4

## 2020-11-06 MED ORDER — SODIUM CHLORIDE 0.9 % IV SOLN
4.0000 mg | Freq: Once | INTRAVENOUS | Status: AC
  Administered 2020-11-06: 4 mg via INTRAVENOUS
  Filled 2020-11-06: qty 5

## 2020-11-06 MED ORDER — GADOBUTROL 1 MMOL/ML IV SOLN
10.0000 mL | Freq: Once | INTRAVENOUS | Status: AC | PRN
  Administered 2020-11-06: 10 mL via INTRAVENOUS

## 2020-11-06 NOTE — Progress Notes (Signed)
RN note: Pt Mews returned to green with HR of 110 at rest, with movement HR increases to 130s, mews returns to yellow. pt. Asymptomatic at this time. BP remains elevated 173/93 Dr. Sidney Ace notified regarding elevation of HR and BP. Pt reports pain 9/10, not using PCA, encouraged use of Morphine PCA, and educated regarding use. Pt verbalized understanding will continue to monitor pt. Closely. No distress at this time. Neomia Dear RN

## 2020-11-06 NOTE — Progress Notes (Signed)
  Addendum to previous MEWS note. Data initially missed.   11/06/20 0035  Assess: MEWS Score  Temp 98.9 F (37.2 C)  BP (!) 157/92  Pulse Rate (!) 113  Resp 19  SpO2 96 %  O2 Flow Rate (L/min) 3 L/min  Assess: MEWS Score  MEWS Temp 0  MEWS Systolic 0  MEWS Pulse 2  MEWS RR 0  MEWS LOC 0  MEWS Score 2  MEWS Score Color Yellow  Assess: if the MEWS score is Yellow or Red  Were vital signs taken at a resting state? Yes  Focused Assessment Change from prior assessment (see assessment flowsheet) (pt with brief period of confusion, reoriented easily, no other changes. Will continue to monitor pt closely. KJones RN)  Does the patient meet 2 or more of the SIRS criteria? No  MEWS guidelines implemented *See Row Information* Yes  Treat  MEWS Interventions Escalated (See documentation below) (Dr. Sidney Ace notified)  Pain Scale 0-10  Pain Score 8  Pain Type Acute pain  Pain Location Back  Pain Orientation Lower;Right  Pain Intervention(s) PCA encouraged  Take Vital Signs  Increase Vital Sign Frequency  Yellow: Q 2hr X 2 then Q 4hr X 2, if remains yellow, continue Q 4hrs  Escalate  MEWS: Escalate Yellow: discuss with charge nurse/RN and consider discussing with provider and RRT  Notify: Charge Nurse/RN  Name of Charge Nurse/RN Notified Humnoke, RN  Date Charge Nurse/RN Notified 11/06/20  Time Charge Nurse/RN Notified 0045  Notify: Provider  Provider Name/Title Argie Ramming, MD  Date Provider Notified 11/06/20  Time Provider Notified [phone]  Notification Type Page  Notification Reason Change in status  Provider response No new orders  Date of Provider Response 11/06/20  Time of Provider Response 0035  Assess: SIRS CRITERIA  SIRS Temperature  0  SIRS Pulse 1  SIRS Respirations  0  SIRS WBC 0  SIRS Score Sum  1

## 2020-11-06 NOTE — Consult Note (Signed)
Consultation Note Date: 11/06/2020   Patient Name: Gary Young  DOB: 1950/06/25  MRN: [id number]  Age / Sex: 70 y.o., male  PCP: Gary Sacramento, MD Referring Physician: Domenic Polite, MD  Reason for Consultation: Establishing goals of care and Pain control  HPI/Patient Profile: 70 y.o. male  with past medical history of melanoma s/p excision, iron deficiency anemia, and moderate protein calorie malnutrition admitted on 11/04/2020 with complaints of weakness and uncontrolled pain.   He was recently found to have metastatic disease on CT scan at outside facility involving his lungs, liver, and bones with unknown primary. Referred to Dr. Marin Young and prescribed fentanyl patched and tramadol, without relief.   Palliative medicine has been consulted to assist with symptom management and goals of care conversations. Per RN, PCA showed 54 demands over the most recent 4 hours.  Clinical Assessment and Goals of Care:  I have reviewed medical records including EPIC notes, labs and imaging, received report from RN, assessed the patient and then met at the bedside along with his wife Gary Young to discuss diagnosis, prognosis, GOC, EOL wishes, disposition and options.  I introduced Palliative Medicine as specialized medical care for people living with serious illness. It focuses on providing relief from the symptoms and stress of a serious illness. The goal is to improve quality of life for both the patient and the family.  We discussed a brief life review of the patient and then focused on their current illness. Gary "Merrilee Young" has lived with his wife Gary Young in [place], [place] for 23 years since they moved from [place]. 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 They are Catholics but have not practiced in some time. They have adult 2 daughters  who live nearby and 1 adult son. Merrilee Young has 1 living twin sister and 1 deceased sister.  Until May 1st, Gary Young was still completely independent with ADLs, driving, cutting down trees, and mowing the yard. It has been a "whirlwind" since then and they are still awaiting results of workup and recommendations for next steps from oncology.    I attempted to elicit values and goals of care important to the patient.    Merrilee Young copes with humor and the support of his family. It is important for him to be able to return home and remain as independent as possible. Gary Young is concerned that she may not be able to take care of him by herself if his pain control does not improve. Discussed the option of short-term SNF for education on body mechanics to avoid pain. They are open to this, although home is still preferred.  Gary Young's pain has been the worst so far today. His neck is quite sore after excision yesterday. His right shoulder, upper back, and groin are also sore after multiple diagnostics yesterday, with pain radiating into his legs. He reports attempting a trial of fentanyl patch for several days, as well as a trial of two fentanyl patches, but this "didn't touch  the pain" nor did Tramadol. He does report the best relief with morphine PCA, although his mobility is still greatly affected due to baseline pain. Risks and benefits of pain management with opioids were discussed at length. Advised on the availability of loading dose before procedures or transfers that cause incidental pain. Counseled on the goal to transition to PO morphine for management at home or SNF. Assisted with transfer from side of bed to lying comfortably in bed.   Hospice and Palliative Care services outpatient were explained and offered. Merrilee Young has experience with hospice, as his mother was receiving services before her death in 76.  Discussed the importance of continued conversation with family and the medical providers regarding overall plan of  care and treatment options, ensuring decisions are within the context of the patient's values and GOCs.    Questions and concerns were addressed. The family was encouraged to call with questions or concerns.  PMT will continue to support holistically.    PATIENT is the primary decision maker. Next of kin is wife Gary Young. No HCPOA on file.    SUMMARY OF RECOMMENDATIONS   -Continue current interventions, patient is awaiting results from additional workup to determine his next steps and options  -Will add 0.75mL/hr basal rate to morphine PCA for long-acting pain control and eventual conversion to long-acting PO morphine -Psychosocial and emotional support provided -PMT will provide ongoing support and symptom management  Code Status/Advance Care Planning:  DNR  Symptom Management:   As above  Palliative Prophylaxis:   Bowel Regimen, Delirium Protocol and Frequent Pain Assessment  Additional Recommendations (Limitations, Scope, Preferences):  Full Scope Treatment  Psycho-social/Spiritual:   Desire for further Chaplaincy support:no  Additional Recommendations: Caregiving  Support/Resources and Education on Hospice  Prognosis:   Poor long-term prognosis given unfortunately widely metastatic cancer  Discharge Planning: To Be Determined      Primary Diagnoses: Present on Admission: . Intractable pain   I have reviewed the medical record, interviewed the patient and family, and examined the patient. The following aspects are pertinent.  Past Medical History:  Diagnosis Date  . Asthma   . Cancer [facility])    Social History   Socioeconomic History  . Marital status: Married    Spouse name: Not on file  . Number of children: Not on file  . Years of education: Not on file  . Highest education level: Not on file  Occupational History  . Not on file  Tobacco Use  . Smoking status: Never Smoker  . Smokeless tobacco: Never Used  Vaping Use  . Vaping Use: Never used   Substance and Sexual Activity  . Alcohol use: Yes    Alcohol/week: 2.0 standard drinks    Types: 1 Glasses of wine, 1 Cans of beer per week    Comment: Socially  . Drug use: Never  . Sexual activity: Not on file  Other Topics Concern  . Not on file  Social History Narrative  . Not on file   Social Determinants of Health   Financial Resource Strain: Not on file  Food Insecurity: Not on file  Transportation Needs: Not on file  Physical Activity: Not on file  Stress: Not on file  Social Connections: Not on file   History reviewed. No pertinent family history. Scheduled Meds: . Chlorhexidine Gluconate Cloth  6 each Topical Daily  . cholecalciferol  2,000 Units Oral Daily  . doxazosin  8 mg Oral QPM  . enoxaparin (LOVENOX) injection  40 mg Subcutaneous Q24H  .  ferrous sulfate  325 mg Oral Daily  . latanoprost  1 drop Left Eye QHS  . metoprolol tartrate  25 mg Oral BID  . morphine   Intravenous Q4H  . polyethylene glycol  17 g Oral Daily  . senna-docusate  2 tablet Oral BID  . timolol  1 drop Both Eyes Daily  . cyanocobalamin  1,000 mcg Oral Daily   Continuous Infusions: . zoledronic acid (ZOMETA) IV 4 mg (11/06/20 1129)   PRN Meds:.acetaminophen, diphenhydrAMINE **OR** diphenhydrAMINE, hydrOXYzine, labetalol, metoprolol tartrate, naloxone **AND** sodium chloride flush, ondansetron (ZOFRAN) IV, oxyCODONE Medications Prior to Admission:  Prior to Admission medications   Medication Sig Start Date End Date Taking? Authorizing Provider  albuterol (VENTOLIN HFA) 108 (90 Base) MCG/ACT inhaler Inhale into the lungs. 10/01/20  Yes [provider]  Cholecalciferol (VITAMIN D) 50 MCG (2000 UT) tablet Take 2,000 Units by mouth daily.   Yes [provider]  cyanocobalamin 1000 MCG tablet Take 1,000 mcg by mouth daily.   Yes [provider]  cyclobenzaprine (FLEXERIL) 10 MG tablet Take 1 tablet by mouth 3 (three) times daily as needed. 10/21/20  Yes [provider]  doxazosin (CARDURA) 8 MG tablet Take 8 mg by mouth daily. 02/06/20  Yes [provider]  fentaNYL (DURAGESIC) 25 MCG/HR Place 1 patch onto the skin every 3 (three) days. 10/27/20  Yes Volanda Napoleon, MD  ferrous sulfate 325 (65 FE) MG tablet Take 325 mg by mouth daily.   Yes [provider]  hydrOXYzine (ATARAX/VISTARIL) 25 MG tablet Take 1 tablet (25 mg total) by mouth 3 (three) times daily as needed. Patient taking differently: Take 25 mg by mouth 3 (three) times daily as needed for itching. 10/27/20  Yes Ennever, Rudell Cobb, MD  latanoprost (XALATAN) 0.005 % ophthalmic solution Place 1 drop into the left eye at bedtime. 10/01/20  Yes [provider]  timolol (TIMOPTIC) 0.5 % ophthalmic solution Place 1 drop into both eyes daily. 01/29/20  Yes [provider]  traMADol (ULTRAM) 50 MG tablet Take 50-100 mg by mouth 3 (three) times daily as needed for moderate pain. 10/26/20  Yes [provider]   No Known Allergies Review of Systems  Musculoskeletal: Positive for back pain and neck pain.  Neurological: Positive for weakness.  All other systems reviewed and are negative.   Physical Exam Vitals and nursing note reviewed.  Constitutional:      Appearance: He is overweight.     Interventions: Nasal cannula in place.  Neck:     Comments: Dressing c/d/i Cardiovascular:     Rate and Rhythm: Tachycardia present.  Pulmonary:     Effort: Pulmonary effort is normal.  Musculoskeletal:     Cervical back: Pain with movement and muscular tenderness present.  Neurological:     Mental Status: He is alert and oriented to person, place, and time.  Psychiatric:        Mood and Affect: Mood normal.     Vital Signs: BP 110/75 (BP Location: Right Arm)   Pulse (!) 124   Temp 97.6 F (36.4 C) (Oral)   Resp 16   Ht 6' (1.829 m)   Wt 105.4 kg   SpO2 92%   BMI 31.51 kg/m  Pain Scale: 0-10 POSS *See Group Information*: 1-Acceptable,Awake and  alert Pain Score: 10-Worst pain ever   SpO2: SpO2: 92 % O2 Device:SpO2: 92 % O2 Flow Rate: .O2 Flow Rate (L/min): 3 L/min  IO: Intake/output summary:   Intake/Output Summary (  Last 24 hours) at 11/06/2020 1153 Last data filed at 11/06/2020 0900 Gross per 24 hour  Intake 340 ml  Output 200 ml  Net 140 ml    LBM: Last BM Date: 11/05/20 Baseline Weight: Weight: 105.7 kg Most recent weight: Weight: 105.4 kg     Palliative Assessment/Data: 40%     Time In: 10:00am Time Out: 11:15am Time Total: 75 minutes Greater than 50% of this time was spent in counseling and coordinating care related to the above assessment and plan.  Dorthy Cooler, PA-C Palliative Medicine Team Team phone # [phone]  Thank you for allowing the Palliative Medicine Team to assist in the care of this patient. Please utilize secure chat with additional questions, if there is no response within 30 minutes please call the above phone number.  Palliative Medicine Team providers are available by phone from 7am to 7pm daily and can be reached through the team cell phone.  Should this patient require assistance outside of these hours, please call the patient's attending physician.

## 2020-11-06 NOTE — Progress Notes (Signed)
PROGRESS NOTE    Gary Young  YIA:[id number] DOB: 12-30-1950 DOA: 11/04/2020 PCP: [name], MD  Brief Narrative: Gary Young is a 53/M with h/o melanoma diagnosed 8 years ago and excised, seasonal allergies, recently diagnosed iron deficiency anemia, who was recently found to have metastatic disease on CT scan at outside facility involving his lungs, liver, and bones with unknown primary.  Onset of symptomatology on 10/11/20 when the patient noted that he was becoming significantly weak with minimal exertion, then developed pelvic pain, referred to orthopedic surgery where a CT abdomen and pelvis with contrast was done on 10/24/2020 revealing extensive metastatic disease affecting his left lower lobe lung, a lesion measuring 3.6 x 4.2 cm, bilateral pulmonary nodules, hepatic metastasis, retroperitoneal masses, soft tissue mass along the right psoas muscle, lytic lesions involving the thoracolumbar spine and bony pelvis.  -Then referred to oncology Dr. Marin Olp.  He was prescribed fentanyl patches and tramadol.  His pain has not been controlled.  Despite his pain management at home he is still having pain so severe he can barely move.  He called hematology oncology's office on the day of admission due to his uncontrolled pain.  Dr. Marin Olp recommended admission for pain control  Assessment & Plan:   Widely metastatic cancer -Previous history of melanoma resected 8 years ago, recent CT from 5/14 revealed extensive metastatic disease affecting his lower lungs, bilateral pulmonary nodules, hepatic metastasis, retroperitoneal masses, soft tissue mass along the right psoas muscle, lytic lesions involving the thoracolumbar spine and bony pelvis.   -Prior to admission was started on 2 fentanyl patches and using tramadol as needed -Continues to be in severe pain despite this, limiting any mobility -Currently on morphine PCA, hopefully this can be transitioned to fentanyl patch and long-acting with other  as needed narcotics over the weekend, Palliative team consulted as well to assist with symptom control -Dr. Marin Olp following, completed staging CT, Port-A-Cath placement and biopsy of right neck nodule -Staging CT concerning for large lesion at T11 cannot rule out epidural extension, MRI ordered -MRI brain for staging also pending -PT OT eval  Intractable diffuse bones and joints pain -As above, currently on morphine PCA -Continue MiraLAX and Senokot  Hypovolemic hyponatremia -Improving, monitor  Moderate protein calorie malnutrition -Supplements as tolerated  Iron deficiency anemia, suspect related to metastatic disease, likely anemia of chronic disease. -Give IV iron   DVT prophylaxis: Subcu Lovenox daily Code Status: DNR  Family Communication:  No family at bedside, discussed with spouse yesterday  Disposition Plan:  Status is: Inpatient  Remains inpatient appropriate because:Inpatient level of care appropriate due to severity of illness   Dispo:  Patient From: Home  Planned Disposition: To be determined  Medically stable for discharge: No     Consultants:   Oncology, palliative care, IR   Procedures:   Antimicrobials:    Subjective: -Feels tired, has moderate pain in his right shoulder, denies any dyspnea, denies nausea vomiting  Objective: Vitals:   11/06/20 1000 11/06/20 1131 11/06/20 1205 11/06/20 1207  BP:      Pulse: (!) 124     Resp:  $Remo'16 19 19  'HLRmD$ Temp:      TempSrc:      SpO2:  92% 93% 93%  Weight:      Height:        Intake/Output Summary (Last 24 hours) at 11/06/2020 1214 Last data filed at 11/06/2020 0900 Gross per 24 hour  Intake 340 ml  Output 200 ml  Net 140 ml  Filed Weights   11/04/20 1525 11/05/20 0136  Weight: 105.7 kg 105.4 kg    Examination:  General exam: Obese chronically ill male laying in bed, awake alert oriented x2 HEENT: Right neck with nodule, positive pallor  CVS: S1-S2, regular rate rhythm Lungs:  Decreased breath sounds to bases Abdomen: Soft, nontender, bowel sounds present Extremities: No edema Neuro: Mild left lower leg weakness Skin: No rashes on exposed skin  Data Reviewed:   CBC: Recent Labs  Lab 11/04/20 1547 11/05/20 0512 11/06/20 0557  WBC 12.8* 12.2* 13.7*  NEUTROABS 10.0*  --  10.1*  HGB 9.2* 8.7* 8.8*  HCT 29.9* 28.6* 28.9*  MCV 82.4 83.9 84.5  PLT 409* 365 660   Basic Metabolic Panel: Recent Labs  Lab 11/04/20 1547 11/05/20 0512 11/06/20 0557  NA 131* 135 133*  K 4.4 4.7 4.4  CL 93* 94* 93*  CO2 28 34* 29  GLUCOSE 133* 133* 106*  BUN $Re'21 22 20  'uyL$ CREATININE 0.80 0.78 0.57*  CALCIUM 9.2 9.2 9.3  MG  --  2.0  --   PHOS  --  4.5  --    GFR: Estimated Creatinine Clearance: 106.3 mL/min (A) (by C-G formula based on SCr of 0.57 mg/dL (L)). Liver Function Tests: Recent Labs  Lab 11/04/20 1547 11/05/20 0512 11/06/20 0557  AST $Re'30 29 26  'etp$ ALT $R'26 23 23  'Ha$ ALKPHOS 109 105 102  BILITOT 0.6 0.3 0.7  PROT 5.9* 5.5* 5.3*  ALBUMIN 2.8* 2.7* 2.5*   No results for input(s): LIPASE, AMYLASE in the last 168 hours. No results for input(s): AMMONIA in the last 168 hours. Coagulation Profile: No results for input(s): INR, PROTIME in the last 168 hours. Cardiac Enzymes: No results for input(s): CKTOTAL, CKMB, CKMBINDEX, TROPONINI in the last 168 hours. BNP (last 3 results) No results for input(s): PROBNP in the last 8760 hours. HbA1C: No results for input(s): HGBA1C in the last 72 hours. CBG: No results for input(s): GLUCAP in the last 168 hours. Lipid Profile: No results for input(s): CHOL, HDL, LDLCALC, TRIG, CHOLHDL, LDLDIRECT in the last 72 hours. Thyroid Function Tests: No results for input(s): TSH, T4TOTAL, FREET4, T3FREE, THYROIDAB in the last 72 hours. Anemia Panel: Recent Labs    11/06/20 0557  RETICCTPCT 2.8   Urine analysis:    Component Value Date/Time   COLORURINE YELLOW 11/04/2020 2200   APPEARANCEUR HAZY (A) 11/04/2020 2200    LABSPEC 1.026 11/04/2020 2200   PHURINE 5.0 11/04/2020 2200   GLUCOSEU NEGATIVE 11/04/2020 2200   HGBUR NEGATIVE 11/04/2020 2200   BILIRUBINUR NEGATIVE 11/04/2020 2200   KETONESUR 5 (A) 11/04/2020 2200   PROTEINUR NEGATIVE 11/04/2020 2200   NITRITE NEGATIVE 11/04/2020 2200   LEUKOCYTESUR NEGATIVE 11/04/2020 2200   Sepsis Labs: $RemoveBefo'@[email or web address]'[id number]$ (procalcitonin:4,lacticidven:4)  ) Recent Results (from the past 240 hour(s))  Resp Panel by RT-PCR (Flu A&B, Covid) Nasopharyngeal Swab     Status: None   Collection Time: 11/04/20  4:44 PM   Specimen: Nasopharyngeal Swab; Nasopharyngeal(NP) swabs in vial transport medium  Result Value Ref Range Status   SARS Coronavirus 2 by RT PCR NEGATIVE NEGATIVE Final    Comment: (NOTE) SARS-CoV-2 target nucleic acids are NOT DETECTED.  The SARS-CoV-2 RNA is generally detectable in upper respiratory specimens during the acute phase of infection. The lowest concentration of SARS-CoV-2 viral copies this assay can detect is 138 copies/mL. A negative result does not preclude SARS-Cov-2 infection and should not be used as the sole basis for treatment or other patient management  decisions. A negative result may occur with  improper specimen collection/handling, submission of specimen other than nasopharyngeal swab, presence of viral mutation(s) within the areas targeted by this assay, and inadequate number of viral copies(<138 copies/mL). A negative result must be combined with clinical observations, patient history, and epidemiological information. The expected result is Negative.  Fact Sheet for Patients:  EntrepreneurPulse.com.au  Fact Sheet for Healthcare Providers:  IncredibleEmployment.be  This test is no t yet approved or cleared by the [place] FDA and  has been authorized for detection and/or diagnosis of SARS-CoV-2 by FDA under an Emergency Use Authorization (EUA). This EUA will remain  in effect (meaning  this test can be used) for the duration of the COVID-19 declaration under Section 564(b)(1) of the Act, 21 U.S.C.section 360bbb-3(b)(1), unless the authorization is terminated  or revoked sooner.       Influenza A by PCR NEGATIVE NEGATIVE Final   Influenza B by PCR NEGATIVE NEGATIVE Final    Comment: (NOTE) The Xpert Xpress SARS-CoV-2/FLU/RSV plus assay is intended as an aid in the diagnosis of influenza from Nasopharyngeal swab specimens and should not be used as a sole basis for treatment. Nasal washings and aspirates are unacceptable for Xpert Xpress SARS-CoV-2/FLU/RSV testing.  Fact Sheet for Patients: EntrepreneurPulse.com.au  Fact Sheet for Healthcare Providers: IncredibleEmployment.be  This test is not yet approved or cleared by the [place] FDA and has been authorized for detection and/or diagnosis of SARS-CoV-2 by FDA under an Emergency Use Authorization (EUA). This EUA will remain in effect (meaning this test can be used) for the duration of the COVID-19 declaration under Section 564(b)(1) of the Act, 21 U.S.C. section 360bbb-3(b)(1), unless the authorization is terminated or revoked.  Performed at [facility], [place] [address], [place], [place] [address]          Radiology Studies: NM Bone Scan Whole Body  Result Date: 11/05/2020 CLINICAL DATA:  Metastatic disease of unknown primary, past history melanoma; groin pain EXAM: NUCLEAR MEDICINE WHOLE BODY BONE SCAN TECHNIQUE: Whole body anterior and posterior images were obtained approximately 3 hours after intravenous injection of radiopharmaceutical. RADIOPHARMACEUTICALS:  21.8 mCi Technetium-94m MDP IV COMPARISON:  None Correlation: CT chest abdomen pelvis 11/05/2020 FINDINGS: Uptake in lower thoracic spine at approximately T11, corresponding to lytic lesion on CT. Uptake seen at the RIGHT sacrum, anterior RIGHT iliac bone, less in LEFT iliac, corresponding  to metastatic lesions on CT. Focus of abnormal uptake at the distal RIGHT femoral diaphysis concerning for subtle metastasis recommend radiographic correlation. Nonspecific tracer uptake at the intertrochanteric region of LEFT femur without definite osseous lesion by CT. Uptake at medial and mid RIGHT clavicle corresponding to metastases. No additional worrisome sites of tracer accumulation. Expected urinary tract and soft tissue distribution of tracer. IMPRESSION: Multiple sites of abnormal tracer uptake consistent with osseous metastases, predominantly corresponding with CT abnormalities. Additional sites of uptake are seen at the distal RIGHT femoral diaphysis and at the intertrochanteric region of the LEFT femur concerning for metastases; recommend radiographic correlation of the RIGHT femur to exclude lesion at risk of pathologic fracture. Electronically Signed   By: Lavonia Dana M.D.   On: 11/05/2020 13:52   US SOFT TISSUE HEAD & NECK (NON-THYROID)  Result Date: 11/04/2020 CLINICAL DATA:  History of metastatic disease, now with palpable abnormality involving the right neck. EXAM: ULTRASOUND OF HEAD/NECK SOFT TISSUES TECHNIQUE: Ultrasound examination of the head and neck soft tissues was performed in the area of clinical concern. COMPARISON:  CT abdomen  and pelvis-11/03/2020 FINDINGS: Sonographic evaluation of the patient's palpable area of concern involving the superior aspect of the right-side of the neck correlates with an approximately 1.8 x 1.7 x 1.3 cm mixed echogenic hypoechoic subcutaneous nodule. This nodule appears to abut the dermal surface. Otherwise, there is no sonographic correlate for patient's palpable area of concern. Specifically, no regional cervical lymphadenopathy. IMPRESSION: Patient's palpable area of concern involving the superior aspect of the right-side of the neck correlates with an approximately 1.8 cm mixed echogenic subdermal subcutaneous nodule which given history of  metastatic malignancy may represent either a subcutaneous metastasis (favored given presence of subcutaneous metastasis on abdominal CT performed 11/03/2020) versus a malignant appearing lymph node. Further evaluation with PET-CT imaging could be performed as indicated. Electronically Signed   By: Sandi Mariscal M.D.   On: 11/04/2020 16:24   CT CHEST ABDOMEN PELVIS W CONTRAST  Result Date: 11/05/2020 CLINICAL DATA:  Metastatic malignancy of unknown primary. History of melanoma six years ago. EXAM: CT CHEST, ABDOMEN, AND PELVIS WITH CONTRAST TECHNIQUE: Multidetector CT imaging of the chest, abdomen and pelvis was performed following the standard protocol during bolus administration of intravenous contrast. CONTRAST:  118mL OMNIPAQUE IOHEXOL 300 MG/ML  SOLN COMPARISON:  Outside CT from 10/24/2020 FINDINGS: CT CHEST FINDINGS Cardiovascular: Normal heart size. No pericardial effusion. Aortic atherosclerosis and coronary artery calcifications. Mediastinum/Nodes: No discrete thyroid nodule. No enlarged axillary or supraclavicular lymph nodes. No mediastinal or hilar adenopathy. Lungs/Pleura: Trace left pleural effusion may be partially loculated. Multifocal pulmonary nodules are identified. The majority of these nodules are tiny, less than 4 mm, and too numerous to count. The larger nodules include: -large paravertebral mass within the superior segment of left lower lobe measuring 4.5 by 4.2 by 7.0 cm, image 88/6. -posterior right lower lobe nodule measures 0.9 cm, image 94/6. -posterolateral left upper lobe nodule measures 0.8 cm, image 75/6. -Posterior right upper lobe lung nodule measures 1.1 cm, image 59/6. Musculoskeletal: Multifocal lytic bone metastases are identified. -Expansile lytic lesion involving the head of the right clavicle has associated pathologic fracture. This measures approximately 3.6 x 3.7 cm, image 7/2. -Lytic lesion with large soft tissue component involves the right-side of the sternal manubrium  measuring 3.8 x 3.8 cm, image 15/2. -Large permeative lesion involving greater than 50% of the T11 vertebral body is noted measuring at least 3.8 x 3.7 by 2.3 cm. I suspect there is a pathologic fracture involving this vertebra which is vertically oriented extending from the superior to inferior endplate. There is also cortical destruction along the left side of the vertebral body as well as the posterior cortex, image 136/4 and image 139/5. -Smaller lucent lesion is noted within the smaller lucent lesion within T7 measures 1.3 cm, image 35/2. CT ABDOMEN PELVIS FINDINGS Hepatobiliary: Multiple liver metastases are identified. Index lesion within the dome measures 4.1 x 4.6 cm, image 47/2. Index lesion within inferior right hepatic lobe, segment 6, measures 5.8 x 5.7 cm, image 69/2. Index lesion within lateral segment of left hepatic lobe measures 2.2 x 2.0 cm, image 53/2. Multiple small stones noted within the gallbladder. No gallbladder wall thickening or inflammation. Pancreas: Previously normal appearing body and tail of pancreas now appears edematous with peripancreatic soft tissue stranding, image 63/2. Within the head of pancreas there is a focal area of relative hypoenhancement which measures 2.2 by 3.0 x 2.6 cm, image 75/2 and image 82/4. Similar to the body and tail this area has mild surrounding haziness. No main duct dilatation identified. Spleen:  Normal in size without focal abnormality. Adrenals/Urinary Tract: Left adrenal gland appears normal. Nodule in the right adrenal gland measures 3.2 by 1.8 cm, image 61/2. Indeterminate. Cyst arising off the posterior cortex of the upper pole of right kidney measures 5.5 cm, image 66/2. There is an adjacent, mildly hyperdense lesion 3.7 by 2.7 cm, image 71/2. This measures 25 Hounsfield units and therefore does not meet criteria for simple cysts. Similarly, there is a small exophytic lesion arising off the posterolateral cortex of the inferior pole of right  kidney measuring 1.3 cm and 30 Hounsfield units, image 84/2. Tiny exophytic lesion arising off the posterior cortex of the interpolar left kidney is too small to characterize measuring 6 mm, image 79/2. Urinary bladder is unremarkable. Stomach/Bowel: Stomach is nondistended. No dilated loops of large or small bowel. No bowel wall thickening or inflammation. No obstructing colon mass identified. Vascular/Lymphatic: Aortic atherosclerosis without aneurysm. Enlarged mesenteric lymph nodes are identified including: -central mesenteric node measures 1.5 cm, image 77/2. -also within the central mesentery is a 1.7 cm lymph node, image 81/2. Reproductive: Mild prostate gland enlargement. Other: No ascites. 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 -Index nodule in the left abdomen along the undersurface of the abdominal wall measures 2.1 cm, image 76/2. -Index nodule in the right abdomen anterior to the ascending colon measures 1.6 cm, image 76/2. Multiple retroperitoneal lesions are also identified. Paravertebral mass posterior to the right psoas muscle measures 7.3 x 4.5 cm, image 90/2. -left lower quadrant pericolic gutter nodule measures 1.2 cm, image 97/2 Within the left abdomen lateral to the interpolar left kidney is a nodule measuring 3.5 cm, image 84/2. Musculoskeletal: Multifocal lytic bone metastases are identified. -large lesion involving the right iliopsoas muscle and right iliac bone measures 7.3 x 6.5 cm, image 98/2. Associated permeative changes are noted involving the iliac bone. -Lytic lesion involving the posterior right iliac bone measures 3.8 x 4.7 cm, image 97/2. -lytic lesion involving the left iliac wing measures 3.8 x 2.1 cm. -A few scattered subcutaneous lesions are also identified, including: -soft tissue nodule within the right flank measuring 1.1 cm, image 89/2. -Right gluteal nodule measuring 0.9 cm, image 95/2. -ventral abdominal wall nodule measures 7 mm,  image 103/3. IMPRESSION: 1. Multiple pulmonary metastases. Most of these are tiny and too numerous to count. There is a dominant lesion involving the left lower lobe with long axis diameter of 7 cm. 2. Multifocal lytic bone metastases. This includes a large permeative lesion involving the T11 vertebral body which arose through the left lateral wall and posterior wall of the vertebral body. If there are clinical concerns for canal involvement consider further evaluation with contrast enhanced MRI of the thoracic spine. 3. Multifocal liver metastasis. 4. Peritoneal and retroperitoneal metastasis. 5. Abnormal appearance of the pancreas. New from 10/24/2020 is edema and peripancreatic soft tissue stranding involving the body and tail of pancreas. Although the appearance is consistent with acute pancreatitis metastatic disease is not excluded. 6. Focal masslike enlargement involving the uncinate process of the pancreas which appears slightly hypodense to the adjacent normal parenchyma is indeterminate. This may represent an area of pancreatitis, metastatic disease or a primary pancreatic neoplasm. 7. Indeterminate right adrenal gland nodule. Cannot rule out metastatic disease. 8. There is a indeterminate lesion arising off the posterior cortex of the right kidney which does not meet criteria for a simple cyst. Similarly, there is a small complex lesion arising off the inferior pole of the right kidney. 9. Gallstones. 10.  Trace left pleural effusion may be partially loculated. 11. Aortic atherosclerosis and coronary artery calcifications. Aortic Atherosclerosis (ICD10-I70.0). Electronically Signed   By: Kerby Moors M.D.   On: 11/05/2020 13:47   IR US Guide Bx Asp/Drain  Result Date: 11/06/2020 INDICATION: Remote history of melanoma, now with metastatic disease of unknown primary. Please from ultrasound-guided biopsy of amenable site of malignancy for tissue diagnostic purposes. Additionally, please place image  guided Port a catheter for the initiation of chemotherapy. EXAM: 1. ULTRASOUND-GUIDED BIOPSY OF INDETERMINATE SUBCUTANEOUS NODULE ALONG THE RIGHT LATERAL ABDOMINAL WALL 2. IMPLANTED PORT A CATH PLACEMENT WITH ULTRASOUND AND FLUOROSCOPIC GUIDANCE COMPARISON:  CT the chest, abdomen and pelvis-11/05/2020 MEDICATIONS: None. ANESTHESIA/SEDATION: Moderate (conscious) sedation was employed during this procedure. A total of Versed 3 mg and Fentanyl 100 mcg was administered intravenously. Moderate Sedation Time: 56 minutes. The patient's level of consciousness and vital signs were monitored continuously by radiology nursing throughout the procedure under my direct supervision. CONTRAST:  None FLUOROSCOPY TIME:  36 seconds (60 mGy) COMPLICATIONS: None immediate. PROCEDURE: The procedure, risks, benefits, and alternatives were explained to the patient. Questions regarding the procedure were encouraged and answered. The patient understands and consents to the procedure. Sonographic evaluation was performed of the lateral aspect of the right abdominal wall demonstrating an approximately 1.3 x 0.8 cm hypoechoic nodule correlating with the dominant subcutaneous nodule seen on preceding abdominal CT image 89, series 2. This subcutaneous nodule was targeted for biopsy given location and sonographic window. The skin overlying the nodule was prepped and draped in usual sterile fashion. After the overlying soft tissues were anesthetized 1% lidocaine with epinephrine, 6 core needle biopsy samples were obtained with an 18 gauge core needle biopsy device. Samples were placed in formalin and submitted to pathology for analysis. Attention was now paid towards placement of the port a catheter. The right neck and chest were prepped with chlorhexidine in a sterile fashion, and a sterile drape was applied covering the operative field. Maximum barrier sterile technique with sterile gowns and gloves were used for the procedure. A timeout was  performed prior to the initiation of the procedure. Local anesthesia was provided with 1% lidocaine with epinephrine. After creating a small venotomy incision, a micropuncture kit was utilized to access the internal jugular vein. Real-time ultrasound guidance was utilized for vascular access including the acquisition of a permanent ultrasound image documenting patency of the accessed vessel. The microwire was utilized to measure appropriate catheter length. A subcutaneous port pocket was then created along the upper chest wall utilizing a combination of sharp and blunt dissection. The pocket was irrigated with sterile saline. A single lumen "Slim" sized power injectable port was chosen for placement. The 8 Fr catheter was tunneled from the port pocket site to the venotomy incision. The port was placed in the pocket. The external catheter was trimmed to appropriate length. At the venotomy, an 8 Fr peel-away sheath was placed over a guidewire under fluoroscopic guidance. The catheter was then placed through the sheath and the sheath was removed. Final catheter positioning was confirmed and documented with a fluoroscopic spot radiograph. The port was accessed with a Huber needle, aspirated and flushed with heparinized saline. The venotomy site was closed with an interrupted 4-0 Vicryl suture. The port pocket incision was closed with interrupted 2-0 Vicryl suture. The skin was opposed with a running subcuticular 4-0 Vicryl suture. Dermabond and Steri-strips were applied to both incisions. Dressings were applied. The patient tolerated the procedure well without immediate post procedural  complication. FINDINGS: Sonographic evaluation was performed of the lateral aspect of the right abdominal wall demonstrating an approximately 1.3 x 0.8 cm hypoechoic nodule correlating with the dominant subcutaneous nodule seen on preceding abdominal CT image 89, series 2. This nodule was successfully biopsied with ultrasound guidance  yielding the acquisition of adequate tissue. After catheter placement, the tip lies within the superior cavoatrial junction. The catheter aspirates and flushes normally and is ready for immediate use. IMPRESSION: 1. Technically successful ultrasound-guided core needle biopsy of indeterminate subcutaneous nodule within the right lateral abdominal wall. 2. Successful placement of a right internal jugular approach power injectable Port-A-Cath. The catheter is ready for immediate use. Electronically Signed   By: Sandi Mariscal M.D.   On: 11/06/2020 08:45   DG Chest Port 1 View  Result Date: 11/04/2020 CLINICAL DATA:  Shoulder pain, recent cancer diagnosis with RIGHT shoulder pain. EXAM: PORTABLE CHEST 1 VIEW COMPARISON:  CT of the abdomen and pelvis from outside facility on Oct 24, 2020. FINDINGS: Trachea midline. Cardiomediastinal contours and hilar structures with LEFT infrahilar lobulation in the setting of known LEFT lower lobe mass. Elevation of the RIGHT hemidiaphragm as before. No lobar consolidation.  No gross pleural effusion. Suspected small nodule at the LEFT lung base as well. On limited assessment, no acute skeletal process. IMPRESSION: 1. No acute cardiopulmonary disease in the setting of LEFT infrahilar lobulation with known LEFT lower lobe mass. 2. Other small basilar nodules not well assessed. Electronically Signed   By: Zetta Bills M.D.   On: 11/04/2020 16:54   IR IMAGING GUIDED PORT INSERTION  Result Date: 11/06/2020 INDICATION: Remote history of melanoma, now with metastatic disease of unknown primary. Please from ultrasound-guided biopsy of amenable site of malignancy for tissue diagnostic purposes. Additionally, please place image guided Port a catheter for the initiation of chemotherapy. EXAM: 1. ULTRASOUND-GUIDED BIOPSY OF INDETERMINATE SUBCUTANEOUS NODULE ALONG THE RIGHT LATERAL ABDOMINAL WALL 2. IMPLANTED PORT A CATH PLACEMENT WITH ULTRASOUND AND FLUOROSCOPIC GUIDANCE COMPARISON:  CT  the chest, abdomen and pelvis-11/05/2020 MEDICATIONS: None. ANESTHESIA/SEDATION: Moderate (conscious) sedation was employed during this procedure. A total of Versed 3 mg and Fentanyl 100 mcg was administered intravenously. Moderate Sedation Time: 56 minutes. The patient's level of consciousness and vital signs were monitored continuously by radiology nursing throughout the procedure under my direct supervision. CONTRAST:  None FLUOROSCOPY TIME:  36 seconds (60 mGy) COMPLICATIONS: None immediate. PROCEDURE: The procedure, risks, benefits, and alternatives were explained to the patient. Questions regarding the procedure were encouraged and answered. The patient understands and consents to the procedure. Sonographic evaluation was performed of the lateral aspect of the right abdominal wall demonstrating an approximately 1.3 x 0.8 cm hypoechoic nodule correlating with the dominant subcutaneous nodule seen on preceding abdominal CT image 89, series 2. This subcutaneous nodule was targeted for biopsy given location and sonographic window. The skin overlying the nodule was prepped and draped in usual sterile fashion. After the overlying soft tissues were anesthetized 1% lidocaine with epinephrine, 6 core needle biopsy samples were obtained with an 18 gauge core needle biopsy device. Samples were placed in formalin and submitted to pathology for analysis. Attention was now paid towards placement of the port a catheter. The right neck and chest were prepped with chlorhexidine in a sterile fashion, and a sterile drape was applied covering the operative field. Maximum barrier sterile technique with sterile gowns and gloves were used for the procedure. A timeout was performed prior to the initiation of the procedure. Local anesthesia was  provided with 1% lidocaine with epinephrine. After creating a small venotomy incision, a micropuncture kit was utilized to access the internal jugular vein. Real-time ultrasound guidance was  utilized for vascular access including the acquisition of a permanent ultrasound image documenting patency of the accessed vessel. The microwire was utilized to measure appropriate catheter length. A subcutaneous port pocket was then created along the upper chest wall utilizing a combination of sharp and blunt dissection. The pocket was irrigated with sterile saline. A single lumen "Slim" sized power injectable port was chosen for placement. The 8 Fr catheter was tunneled from the port pocket site to the venotomy incision. The port was placed in the pocket. The external catheter was trimmed to appropriate length. At the venotomy, an 8 Fr peel-away sheath was placed over a guidewire under fluoroscopic guidance. The catheter was then placed through the sheath and the sheath was removed. Final catheter positioning was confirmed and documented with a fluoroscopic spot radiograph. The port was accessed with a Huber needle, aspirated and flushed with heparinized saline. The venotomy site was closed with an interrupted 4-0 Vicryl suture. The port pocket incision was closed with interrupted 2-0 Vicryl suture. The skin was opposed with a running subcuticular 4-0 Vicryl suture. Dermabond and Steri-strips were applied to both incisions. Dressings were applied. The patient tolerated the procedure well without immediate post procedural complication. FINDINGS: Sonographic evaluation was performed of the lateral aspect of the right abdominal wall demonstrating an approximately 1.3 x 0.8 cm hypoechoic nodule correlating with the dominant subcutaneous nodule seen on preceding abdominal CT image 89, series 2. This nodule was successfully biopsied with ultrasound guidance yielding the acquisition of adequate tissue. After catheter placement, the tip lies within the superior cavoatrial junction. The catheter aspirates and flushes normally and is ready for immediate use. IMPRESSION: 1. Technically successful ultrasound-guided core  needle biopsy of indeterminate subcutaneous nodule within the right lateral abdominal wall. 2. Successful placement of a right internal jugular approach power injectable Port-A-Cath. The catheter is ready for immediate use. Electronically Signed   By: Sandi Mariscal M.D.   On: 11/06/2020 08:45    Scheduled Meds: . Chlorhexidine Gluconate Cloth  6 each Topical Daily  . cholecalciferol  2,000 Units Oral Daily  . doxazosin  8 mg Oral QPM  . enoxaparin (LOVENOX) injection  40 mg Subcutaneous Q24H  . ferrous sulfate  325 mg Oral Daily  . latanoprost  1 drop Left Eye QHS  . metoprolol tartrate  25 mg Oral BID  . morphine   Intravenous Q4H  . polyethylene glycol  17 g Oral Daily  . senna-docusate  2 tablet Oral BID  . timolol  1 drop Both Eyes Daily  . cyanocobalamin  1,000 mcg Oral Daily   Continuous Infusions: . zoledronic acid (ZOMETA) IV 4 mg (11/06/20 1129)     LOS: 1 day    Time spent: 35min  Domenic Polite, MD [facility] 11/06/2020, 12:14 PM

## 2020-11-06 NOTE — Progress Notes (Signed)
Initial Nutrition Assessment  DOCUMENTATION CODES:   Obesity unspecified  INTERVENTION:   -Ensure Enlive po BID, each supplement provides 350 kcal and 20 grams of protein  -Multivitamin with minerals daily  NUTRITION DIAGNOSIS:   Increased nutrient needs related to cancer and cancer related treatments as evidenced by estimated needs.  GOAL:   Patient will meet greater than or equal to 90% of their needs  MONITOR:   PO intake,Supplement acceptance,Labs,Weight trends,I & O's  REASON FOR ASSESSMENT:   Consult,Malnutrition Screening Tool Assessment of nutrition requirement/status  ASSESSMENT:   70 y.o. male with medical history significant for melanoma diagnosed 8 years ago and excised, seasonal allergies, recently diagnosed iron deficiency anemia, who was recently found to have metastatic disease on CT scan at outside facility involving his lungs, liver, and bones with unknown primary.  Patient unavailable at time of visit, palliative care in the room for [place] meeting.  Per chart review, pt has had decreased appetite but reports intentional weight loss of 12 lbs. Pt now with extensive metastatic disease, primary unknown per oncology note.  Currently consuming 100% of meals. Given extent of disease, will order Ensure supplements for additional kcals and protein.  Per weight records, pt has lost 11 lbs since 5/3, pt reports this as intentional.   Medications: Vitamin D, Ferrous sulfate, Miralax, Senokot, Vitamin D  Labs reviewed: Low Na  NUTRITION - FOCUSED PHYSICAL EXAM:  Unable to complete, will attempt at follow-up and depending on [place].  Diet Order:   Diet Order            Diet regular Room service appropriate? Yes; Fluid consistency: Thin  Diet effective now                 EDUCATION NEEDS:   No education needs have been identified at this time  Skin:  Skin Assessment: Reviewed RN Assessment  Last BM:  5/26  Height:   Ht Readings from Last 1  Encounters:  11/04/20 6' (1.829 m)    Weight:   Wt Readings from Last 1 Encounters:  11/05/20 105.4 kg    BMI:  Body mass index is 31.51 kg/m.  Estimated Nutritional Needs:   Kcal:  2100-2300  Protein:  115-130g  Fluid:  2.1L/day  Clayton Bibles, MS, RD, LDN Inpatient Clinical Dietitian Contact information available via Amion

## 2020-11-06 NOTE — Progress Notes (Signed)
  We did a lot yesterday with Gary Young.  He had a CT scans done.  I really am not surprised that he has extensive metastatic disease.  I am just very disappointed that he has such extensive bony metastasis.  He has likely pathologic fracture of the head of the clavicle.  This is on the right side.  This might be why his right shoulder hurts so much.  More importantly, the fact that there is a large lesion at T11.  This is likely cause a pathologic fracture.  I think were going to have to do an MRI to see if there is any type of cord compromise.  He would not surprise me if he has brain metastasis.  He did have a biopsy done.  This was of a liver lesion.  He does have pain in the thighs.  This might be secondary to his T11 lesion.  I will start him on some steroids to see if this might not help a little bit.  He is eating a little bit better.  There is no nausea or vomiting.  His electrolytes show a sodium of 133.  Potassium 4.4.  BUN 20 creatinine 0.57.  Calcium is 9.3 with an albumin of 2.5.  His white cell count is 13.7.  Hemoglobin 8.8.  Platelet count 359,000.  We did going give him a dose of IV iron yesterday.  At the time, his iron saturation was 3%.  He continues on the PCA morphine.  I think we have to keep him on this right now.  I am sure when we can move him over to something that is oral.  Always vital signs are relatively stable.  His blood pressure is 125/76.  Pulse is 95.  Temperature 99.3.  I just have to believe that we are dealing with melanoma that is recurred.  This is exactly how melanoma behaves.  Would not have to get an MRI of his thoracic spine to see if there is any type of cord compromise.  I will put him on some Zometa.  I will give him some Decadron to see if this may not help a little bit.  I do appreciate the outstanding care that he is getting from all staff up on 6 E.   Lattie Haw, MD  2 Corinthians 9:10-12

## 2020-11-06 NOTE — Progress Notes (Addendum)
   11/06/20 0035  Assess: MEWS Score  Temp 98.9 F (37.2 C)  BP (!) 157/92  Pulse Rate (!) 113  Resp 19  SpO2 96 %  O2 Flow Rate (L/min) 3 L/min  Assess: MEWS Score  MEWS Temp 0  MEWS Systolic 0  MEWS Pulse 2  MEWS RR 0  MEWS LOC 0  MEWS Score 2  MEWS Score Color Yellow  Assess: if the MEWS score is Yellow or Red  Were vital signs taken at a resting state? Yes  Focused Assessment Change from prior assessment (see assessment flowsheet) (pt with brief period of confusion, reoriented easily, no other changes. Will continue to monitor pt closely. KJones RN)  Does the patient meet 2 or more of the SIRS criteria? No  MEWS guidelines implemented *See Row Information* Yes  Treat  MEWS Interventions Escalated (See documentation below) (Dr. Sidney Ace notified)  Pain Scale 0-10  Pain Score 8  Pain Type Acute pain  Pain Location Back  Pain Orientation Lower;Right  Pain Intervention(s) PCA encouraged  Assess: SIRS CRITERIA  SIRS Temperature  0  SIRS Pulse 1  SIRS Respirations  0  SIRS WBC 0  SIRS Score Sum  1  Notified provider Dr. Argie Ramming, no new orders obtained at this time. No distress noted, will continue to monitor pt closely. Kjones RN

## 2020-11-07 DIAGNOSIS — C438 Malignant melanoma of overlapping sites of skin: Secondary | ICD-10-CM | POA: Diagnosis not present

## 2020-11-07 DIAGNOSIS — Z7189 Other specified counseling: Secondary | ICD-10-CM | POA: Diagnosis not present

## 2020-11-07 DIAGNOSIS — R160 Hepatomegaly, not elsewhere classified: Secondary | ICD-10-CM

## 2020-11-07 DIAGNOSIS — Z85820 Personal history of malignant melanoma of skin: Secondary | ICD-10-CM | POA: Diagnosis not present

## 2020-11-07 DIAGNOSIS — C801 Malignant (primary) neoplasm, unspecified: Secondary | ICD-10-CM | POA: Diagnosis not present

## 2020-11-07 DIAGNOSIS — R52 Pain, unspecified: Secondary | ICD-10-CM | POA: Diagnosis not present

## 2020-11-07 DIAGNOSIS — R221 Localized swelling, mass and lump, neck: Secondary | ICD-10-CM | POA: Diagnosis not present

## 2020-11-07 DIAGNOSIS — G893 Neoplasm related pain (acute) (chronic): Secondary | ICD-10-CM | POA: Diagnosis not present

## 2020-11-07 LAB — CBC WITH DIFFERENTIAL/PLATELET
Abs Immature Granulocytes: 0.23 10*3/uL — ABNORMAL HIGH (ref 0.00–0.07)
Basophils Absolute: 0 10*3/uL (ref 0.0–0.1)
Basophils Relative: 0 %
Eosinophils Absolute: 0 10*3/uL (ref 0.0–0.5)
Eosinophils Relative: 0 %
HCT: 29.1 % — ABNORMAL LOW (ref 39.0–52.0)
Hemoglobin: 8.9 g/dL — ABNORMAL LOW (ref 13.0–17.0)
Immature Granulocytes: 1 %
Lymphocytes Relative: 6 %
Lymphs Abs: 1 10*3/uL (ref 0.7–4.0)
MCH: 25.5 pg — ABNORMAL LOW (ref 26.0–34.0)
MCHC: 30.6 g/dL (ref 30.0–36.0)
MCV: 83.4 fL (ref 80.0–100.0)
Monocytes Absolute: 0.7 10*3/uL (ref 0.1–1.0)
Monocytes Relative: 4 %
Neutro Abs: 14.9 10*3/uL — ABNORMAL HIGH (ref 1.7–7.7)
Neutrophils Relative %: 89 %
Platelets: 352 10*3/uL (ref 150–400)
RBC: 3.49 MIL/uL — ABNORMAL LOW (ref 4.22–5.81)
RDW: 16.1 % — ABNORMAL HIGH (ref 11.5–15.5)
WBC: 16.8 10*3/uL — ABNORMAL HIGH (ref 4.0–10.5)
nRBC: 0 % (ref 0.0–0.2)

## 2020-11-07 LAB — COMPREHENSIVE METABOLIC PANEL WITH GFR
ALT: 26 U/L (ref 0–44)
AST: 28 U/L (ref 15–41)
Albumin: 2.7 g/dL — ABNORMAL LOW (ref 3.5–5.0)
Alkaline Phosphatase: 105 U/L (ref 38–126)
Anion gap: 11 (ref 5–15)
BUN: 23 mg/dL (ref 8–23)
CO2: 29 mmol/L (ref 22–32)
Calcium: 9.3 mg/dL (ref 8.9–10.3)
Chloride: 97 mmol/L — ABNORMAL LOW (ref 98–111)
Creatinine, Ser: 0.52 mg/dL — ABNORMAL LOW (ref 0.61–1.24)
GFR, Estimated: >60 mL/min
Glucose, Bld: 203 mg/dL — ABNORMAL HIGH (ref 70–99)
Potassium: 4.3 mmol/L (ref 3.5–5.1)
Sodium: 137 mmol/L (ref 135–145)
Total Bilirubin: 0.4 mg/dL (ref 0.3–1.2)
Total Protein: 5.8 g/dL — ABNORMAL LOW (ref 6.5–8.1)

## 2020-11-07 LAB — ERYTHROPOIETIN: Erythropoietin: 59.8 m[IU]/mL — ABNORMAL HIGH (ref 2.6–18.5)

## 2020-11-07 LAB — OCCULT BLOOD X 1 CARD TO LAB, STOOL: Fecal Occult Bld: POSITIVE — AB

## 2020-11-07 MED ORDER — PANTOPRAZOLE SODIUM 40 MG PO TBEC
40.0000 mg | DELAYED_RELEASE_TABLET | Freq: Two times a day (BID) | ORAL | Status: DC
Start: 2020-11-07 — End: 2020-11-13
  Administered 2020-11-07 – 2020-11-13 (×13): 40 mg via ORAL
  Filled 2020-11-07 (×13): qty 1

## 2020-11-07 MED ORDER — SODIUM CHLORIDE 0.9 % IV SOLN
20.0000 mg | INTRAVENOUS | Status: DC
  Administered 2020-11-07 – 2020-11-13 (×6): 20 mg via INTRAVENOUS
  Filled 2020-11-07 (×8): qty 2

## 2020-11-07 MED ORDER — DEXAMETHASONE SODIUM PHOSPHATE 4 MG/ML IJ SOLN: 20.0000 mg | INTRAMUSCULAR | Status: DC

## 2020-11-07 NOTE — Evaluation (Signed)
Occupational Therapy Evaluation Patient Details Name: Gary Young MRN: [id number] DOB: 1951/04/25 Today's Date: 11/07/2020    History of Present Illness Pt is 70 yo male w/ h/o melanoma 8 years ago that was excised.  Pt was found to have extensive metastatic CA on 10/24/20.   Pt admitted on 11/04/20 with widely metastatic CA, intractable bone and joint pain and hyponatremia.  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 Per oncology note likely pathological fx of clavicle on R.   Pt has been started on decadron and PCA pump.   Clinical Impression   Pt was independent until 2 weeks ago. He has been using a RW recently. Pt presents with impaired standing balance decreased safety awareness. He requires up to min assist for ADL and min guard assist to ambulate with RW. His pain is managed with PCA. Pt has excellent family support for return home with Olivia. Will follow acutely.    Follow Up Recommendations  Home health OT    Equipment Recommendations  None recommended by OT    Recommendations for Other Services       Precautions / Restrictions Precautions Precautions: Fall      Mobility Bed Mobility Overal bed mobility: Needs Assistance Bed Mobility: Rolling;Sidelying to Sit Rolling: Supervision Sidelying to sit: Supervision       General bed mobility comments: increased time and use of bed rail    Transfers Overall transfer level: Needs assistance Equipment used: Rolling walker (2 wheeled) Transfers: Sit to/from Stand Sit to Stand: Min guard;From elevated surface         General transfer comment: min guard for safety, pulled up on walker    Balance Overall balance assessment: Needs assistance Sitting-balance support: No upper extremity supported Sitting balance-Leahy Scale: Good     Standing balance support: Bilateral upper extremity supported;No upper extremity supported Standing balance-Leahy Scale:  Fair Standing balance comment: RW ambulation but could stand statically without support                           ADL either performed or assessed with clinical judgement   ADL Overall ADL's : Needs assistance/impaired Eating/Feeding: Independent   Grooming: Set up;Sitting   Upper Body Bathing: Minimal assistance;Sitting   Lower Body Bathing: Minimal assistance;Sit to/from stand   Upper Body Dressing : Set up;Sitting   Lower Body Dressing: Minimal assistance;Sit to/from stand   Toilet Transfer: Min guard;Ambulation;RW   Toileting- Water quality scientist and Hygiene: Min guard;Sit to/from stand       Functional mobility during ADLs: Min guard;Rolling walker (cues for safety) General ADL Comments: VSS on RA     Vision Patient Visual Report: No change from baseline       Perception     Praxis      Pertinent Vitals/Pain Pain Assessment: 0-10 Pain Score: 1  Pain Location: R shoulder Pain Descriptors / Indicators: Discomfort Pain Intervention(s): Monitored during session;Repositioned     Hand Dominance Right   Extremity/Trunk Assessment Upper Extremity Assessment Upper Extremity Assessment: Overall WFL for tasks assessed   Lower Extremity Assessment Lower Extremity Assessment: Defer to PT evaluation    Cervical / Trunk Assessment Cervical / Trunk Assessment: Normal (hx of back sx)   Communication Communication Communication: No difficulties   Cognition Arousal/Alertness: Awake/alert Behavior During Therapy: WFL for tasks assessed/performed Overall Cognitive Status: Within Functional Limits for tasks assessed  General Comments: family correcting pt at times when asked about home set up and PLOF   General Comments  VSS on RA - pt does have 2 L O2 on with PCA but ambulated on RA with sats 95%    Exercises     Shoulder Instructions      Home Living Family/patient expects to be discharged to::  Private residence Living Arrangements: Spouse/significant other Available Help at Discharge: Family Type of Home: House Home Access: Stairs to enter Technical brewer of Steps: 5 Entrance Stairs-Rails: Right;Left Home Layout: One level;Laundry or work area in basement     [employer] Shower/Tub: Teacher, early years/pre: [place]: Environmental consultant - 2 wheels          Prior Functioning/Environment Level of Independence: Needs assistance  Gait / Transfers Assistance Needed: Pt was very independent and ambulated without AD until earlier this month; since that time has needed RW ADL's / Homemaking Assistance Needed: Pt has remained independent with ADLs   Comments: Pt was completely independent prior to earlier this month when diagnosed with metastatic CA - he was mowing yards, carving wood, cutting trees        OT Problem List: Decreased activity tolerance;Impaired balance (sitting and/or standing);Decreased cognition;Decreased knowledge of use of DME or AE      OT Treatment/Interventions: Self-care/ADL training;DME and/or AE instruction;Patient/family education;Balance training;Therapeutic activities    OT Goals(Current goals can be found in the care plan section) Acute Rehab OT Goals Patient Stated Goal: return home OT Goal Formulation: With patient Time For Goal Achievement: 11/21/20 Potential to Achieve Goals: Good ADL Goals Pt Will Perform Grooming: with supervision;standing Pt Will Perform Lower Body Bathing: with supervision;sit to/from stand Pt Will Perform Lower Body Dressing: with supervision;sit to/from stand Pt Will Transfer to Toilet: with supervision;ambulating;regular height toilet Pt Will Perform Toileting - Clothing Manipulation and hygiene: with supervision;sit to/from stand Additional ADL Goal #1: Pt will state at least 3 fall prevention strategies as instructed.  OT Frequency: Min 2X/week   Barriers to D/C:             Co-evaluation   Reason for Co-Treatment: For patient/therapist safety PT goals addressed during session: Mobility/safety with mobility OT goals addressed during session: ADL's and self-care      AM-PAC OT "6 Clicks" Daily Activity     Outcome Measure Help from another person eating meals?: None Help from another person taking care of personal grooming?: A Little Help from another person toileting, which includes using toliet, bedpan, or urinal?: A Little Help from another person bathing (including washing, rinsing, drying)?: A Little Help from another person to put on and taking off regular upper body clothing?: A Little Help from another person to put on and taking off regular lower body clothing?: A Little 6 Click Score: 19   End of Session Equipment Utilized During Treatment: Rolling walker;Gait belt Nurse Communication: Mobility status  Activity Tolerance: Patient tolerated treatment well Patient left: in chair;with call bell/phone within reach;with chair alarm set;with family/visitor present  OT Visit Diagnosis: Unsteadiness on feet (R26.81);Other abnormalities of gait and mobility (R26.89);Pain                Time: 7412-8786 OT Time Calculation (min): 25 min Charges:  OT General Charges $OT Visit: 1 Visit OT Evaluation $OT Eval Moderate Complexity: 1 Mod  Nestor Lewandowsky, OTR/L Acute Rehabilitation Services Pager: [phone] Office: [phone]  Malka So 11/07/2020, 4:07 PM

## 2020-11-07 NOTE — Evaluation (Signed)
Physical Therapy Evaluation Patient Details Name: Gary Young MRN: [id number] DOB: [birth year] Today's Date: 11/07/2020   History of Present Illness  Pt is 70 yo male w/ h/o melanoma 8 years ago that was excised.  Pt was found to have extensive metastatic CA on 10/24/20.   Pt admitted on 11/04/20 with widely metastatic CA, intractable bone and joint pain and hyponatremia.  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 Per oncology note likely pathological fx of clavicle on R.   Pt has been started on decadron and PCA pump.  Clinical Impression   Pt admitted with above diagnosis. Pt was very independent and active until sudden onset of weakness and found to have metastatic CA.  For the past couple of weeks, had increased difficulty with mobility and using RW due to pain.  Pt's pain is now improving with medical management.  He was able to ambulate 72' with min guard but did demonstrate some instability in knees and decreased safety.  Pt's pain was well controlled at time of PT eval with use of PCA.  Pt has good support from family and RW.  If pt remains able to mobilize at current level as PCA is weaned - recommend home with family and HHPT.  Pt currently with functional limitations due to the deficits listed below (see PT Problem List). Pt will benefit from skilled PT to increase their independence and safety with mobility to allow discharge to the venue listed below.       Follow Up Recommendations Home health PT;Supervision/Assistance - 24 hour    Equipment Recommendations  None recommended by PT    Recommendations for Other Services       Precautions / Restrictions Precautions Precautions: Fall      Mobility  Bed Mobility Overal bed mobility: Needs Assistance Bed Mobility: Rolling;Sidelying to Sit Rolling: Supervision Sidelying to sit: Supervision       General bed mobility comments: increased time and use of bed rail     Transfers Overall transfer level: Needs assistance Equipment used: Rolling walker (2 wheeled) Transfers: Sit to/from Stand Sit to Stand: Min guard;From elevated surface         General transfer comment: min guard for safety  Ambulation/Gait Ambulation/Gait assistance: Min guard Gait Distance (Feet): 75 Feet Assistive device: Rolling walker (2 wheeled) Gait Pattern/deviations: Step-through pattern Gait velocity: decreased   General Gait Details: Mild instability in knees had chair follow; frequent cues for RW proximity particularly with turns; mildly impusive  Science writer    Modified Rankin (Stroke Patients Only)       Balance Overall balance assessment: Needs assistance Sitting-balance support: No upper extremity supported Sitting balance-Leahy Scale: Good     Standing balance support: Bilateral upper extremity supported;No upper extremity supported Standing balance-Leahy Scale: Fair Standing balance comment: RW ambulation but could stand statically without support                             Pertinent Vitals/Pain Pain Assessment: 0-10 Pain Score: 1  Pain Location: R shoulder Pain Descriptors / Indicators: Discomfort Pain Intervention(s): Limited activity within patient's tolerance;Monitored during session;PCA encouraged    Home Living Family/patient expects to be discharged to:: Private residence Living Arrangements: Spouse/significant other Available Help at Discharge: Family Type of Home: House Home Access: Stairs to enter Entrance Stairs-Rails: Psychiatric nurse  of Steps: 5 Home Layout: One level;Laundry or work area in [place]: Environmental consultant - 2 wheels      Prior Function Level of Independence: Needs assistance   Gait / Transfers Assistance Needed: Pt was very independent and ambulated without AD until earlier this month; since that time has needed RW  ADL's / Homemaking  Assistance Needed: Pt has remained independent with ADLs  Comments: Pt was completely independent prior to earlier this month when diagnosed with metastatic CA - he was mowing yards, carving wood, cutting trees     Hand Dominance        Extremity/Trunk Assessment   Upper Extremity Assessment Upper Extremity Assessment: Defer to OT evaluation    Lower Extremity Assessment Lower Extremity Assessment: LLE deficits/detail;RLE deficits/detail RLE Deficits / Details: ROM WFL; MMT strong 5/5 throughout LLE Deficits / Details: ROM WFL; MMT strong 5/5 throughout except L hip flexion 4/5    Cervical / Trunk Assessment Cervical / Trunk Assessment: Normal  Communication   Communication: No difficulties  Cognition Arousal/Alertness: Awake/alert Behavior During Therapy: WFL for tasks assessed/performed Overall Cognitive Status: Within Functional Limits for tasks assessed                                        General Comments General comments (skin integrity, edema, etc.): VSS on RA - pt does have 2 L O2 on with PCA but ambulated on RA with sats 95%    Exercises     Assessment/Plan    PT Assessment Patient needs continued PT services  PT Problem List Decreased strength;Decreased mobility;Decreased safety awareness;Decreased activity tolerance;Cardiopulmonary status limiting activity;Decreased balance;Decreased knowledge of use of DME       PT Treatment Interventions DME instruction;Therapeutic activities;Gait training;Therapeutic exercise;Patient/family education;Stair training;Balance training;Functional mobility training    PT Goals (Current goals can be found in the Care Plan section)  Acute Rehab PT Goals Patient Stated Goal: return home PT Goal Formulation: With patient/family Time For Goal Achievement: 11/21/20 Potential to Achieve Goals: Good    Frequency Min 3X/week   Barriers to discharge        Co-evaluation PT/OT/SLP Co-Evaluation/Treatment:  Yes Reason for Co-Treatment: For patient/therapist safety PT goals addressed during session: Mobility/safety with mobility OT goals addressed during session: ADL's and self-care       AM-PAC PT "6 Clicks" Mobility  Outcome Measure Help needed turning from your back to your side while in a flat bed without using bedrails?: A Little Help needed moving from lying on your back to sitting on the side of a flat bed without using bedrails?: A Little Help needed moving to and from a bed to a chair (including a wheelchair)?: A Little Help needed standing up from a chair using your arms (e.g., wheelchair or bedside chair)?: A Little Help needed to walk in hospital room?: A Little Help needed climbing 3-5 steps with a railing? : A Little 6 Click Score: 18    End of Session Equipment Utilized During Treatment: Gait belt Activity Tolerance: Patient tolerated treatment well Patient left: with chair alarm set;in chair;with call bell/phone within reach;with family/visitor present Nurse Communication: Mobility status PT Visit Diagnosis: Unsteadiness on feet (R26.81);Muscle weakness (generalized) (M62.81)    Time: 1610-9604 PT Time Calculation (min) (ACUTE ONLY): 30 min   Charges:   PT Evaluation $PT Eval Moderate Complexity: 1 Mod          Jodine Muchmore, PT  Acute Rehab Services Pager [phone] [facility] [place] 11/07/2020, 3:14 PM

## 2020-11-07 NOTE — Progress Notes (Signed)
PROGRESS NOTE    Gary Young  VZS:[id number] DOB: 1951-04-12 DOA: 11/04/2020 PCP: [name], MD  Brief Narrative: Gary Young is a 20/M with h/o melanoma diagnosed 8 years ago and excised, seasonal allergies, recently diagnosed iron deficiency anemia, who was recently found to have metastatic disease on CT scan at outside facility involving his lungs, liver, and bones with unknown primary.  Onset of symptomatology on 10/11/20 when the patient noted that he was becoming significantly weak with minimal exertion, then developed pelvic pain, referred to orthopedic surgery where a CT abdomen and pelvis with contrast was done on 10/24/2020 revealing extensive metastatic disease affecting his left lower lobe lung, a lesion measuring 3.6 x 4.2 cm, bilateral pulmonary nodules, hepatic metastasis, retroperitoneal masses, soft tissue mass along the right psoas muscle, lytic lesions involving the thoracolumbar spine and bony pelvis.  -Then referred to oncology Dr. Marin Olp.  He was prescribed fentanyl patches and tramadol.  His pain has not been controlled.  Despite his pain management at home he is still having pain so severe he can barely move.  He called hematology oncology's office on the day of admission due to his uncontrolled pain.  Dr. Marin Olp recommended admission for pain control  Assessment & Plan:   Widely metastatic cancer -Previous history of melanoma resected 8 years ago, recent CT from 5/14 revealed extensive metastatic disease affecting his lower lungs, bilateral pulmonary nodules, hepatic metastasis, retroperitoneal masses, soft tissue mass along the right psoas muscle, lytic lesions involving the thoracolumbar spine and bony pelvis.   -Prior to admission was started on 2 fentanyl patches and using tramadol as needed -Despite this continues to be in severe pain, started on a morphine PCA on admission -Work-up notable for extensive disease -Dr. Marin Olp following, completed staging CT,  Port-A-Cath placement and biopsy of right neck nodule -Pathology pending -MRI brain with multiple nonhemorrhagic metastasis, thoracic spinal disease noted as well -Per Dr. Marin Olp , will need XRT, plan to consult radiation oncology after long weekend -Increase activity as tolerated, PT OT -Palliative team following  Intractable diffuse bones and joints pain -As above, currently on morphine PCA, doing much better after starting basal rate -Continue MiraLAX and Senokot  Hypovolemic hyponatremia -Improving, monitor  Moderate protein calorie malnutrition -Supplements as tolerated  Iron deficiency anemia, suspect related to metastatic disease, likely anemia of chronic disease. -Given IV iron   DVT prophylaxis: Subcu Lovenox daily Code Status: DNR  Family Communication:  Discussed with wife at bedside  Disposition Plan:  Status is: Inpatient  Remains inpatient appropriate because:Inpatient level of care appropriate due to severity of illness   Dispo:  Patient From: Home  Planned Disposition: To be determined  Medically stable for discharge: No     Consultants:   Oncology, palliative care, IR   Procedures:   Antimicrobials:    Subjective: -Feels better, pain is slightly better controlled, had a bowel movement last night, eating some  Objective: Vitals:   11/07/20 0528 11/07/20 0730 11/07/20 1035 11/07/20 1047  BP: (!) 143/81  135/80   Pulse: 69  78   Resp: _0 Temp: 97.6 F (36.4 C)     TempSrc: Oral     SpO2: 98% 96%  98%  Weight:      Height:        Intake/Output Summary (Last 24 hours) at 11/07/2020 1211 Last data filed at 11/07/2020 0859 Gross per 24 hour  Intake 240 ml  Output 900 ml  Net -660 ml  Filed Weights   11/04/20 1525 11/05/20 0136  Weight: 105.7 kg 105.4 kg    Examination:  General exam: Obese chronically ill male laying in bed, AAO x3, no distress HEENT: Right neck with nodule, positive pallor CVS: S1-S2, regular  rate rhythm Lungs: Decreased breath sounds to bases Abdomen: Soft, nontender, bowel sounds present Extremities: No edema Neuro: Mild left lower extremity weakness Skin: No rashes on exposed skin  Data Reviewed:   CBC: Recent Labs  Lab 11/04/20 1547 11/05/20 0512 11/06/20 0557 11/07/20 0431  WBC 12.8* 12.2* 13.7* 16.8*  NEUTROABS 10.0*  --  10.1* 14.9*  HGB 9.2* 8.7* 8.8* 8.9*  HCT 29.9* 28.6* 28.9* 29.1*  MCV 82.4 83.9 84.5 83.4  PLT 409* 365 359 774   Basic Metabolic Panel: Recent Labs  Lab 11/04/20 1547 11/05/20 0512 11/06/20 0557 11/07/20 0431  NA 131* 135 133* 137  K 4.4 4.7 4.4 4.3  CL 93* 94* 93* 97*  CO2 28 34* 29 29  GLUCOSE 133* 133* 106* 203*  BUN _0 CREATININE 0.80 0.78 0.57* 0.52*  CALCIUM 9.2 9.2 9.3 9.3  MG  --  2.0  --   --   PHOS  --  4.5  --   --    GFR: Estimated Creatinine Clearance: 106.3 mL/min (A) (by C-G formula based on SCr of 0.52 mg/dL (L)). Liver Function Tests: Recent Labs  Lab 11/04/20 1547 11/05/20 0512 11/06/20 0557 11/07/20 0431  AST _1 ALT _2 ALKPHOS 109 105 102 105  BILITOT 0.6 0.3 0.7 0.4  PROT 5.9* 5.5* 5.3* 5.8*  ALBUMIN 2.8* 2.7* 2.5* 2.7*   No results for input(s): LIPASE, AMYLASE in the last 168 hours. No results for input(s): AMMONIA in the last 168 hours. Coagulation Profile: No results for input(s): INR, PROTIME in the last 168 hours. Cardiac Enzymes: No results for input(s): CKTOTAL, CKMB, CKMBINDEX, TROPONINI in the last 168 hours. BNP (last 3 results) No results for input(s): PROBNP in the last 8760 hours. HbA1C: No results for input(s): HGBA1C in the last 72 hours. CBG: No results for input(s): GLUCAP in the last 168 hours. Lipid Profile: No results for input(s): CHOL, HDL, LDLCALC, TRIG, CHOLHDL, LDLDIRECT in the last 72 hours. Thyroid Function Tests: No results for input(s): TSH, T4TOTAL, FREET4, T3FREE, THYROIDAB in the last 72 hours. Anemia Panel: Recent Labs     11/06/20 0557  RETICCTPCT 2.8   Urine analysis:    Component Value Date/Time   COLORURINE YELLOW 11/04/2020 2200   APPEARANCEUR HAZY (A) 11/04/2020 2200   LABSPEC 1.026 11/04/2020 2200   PHURINE 5.0 11/04/2020 2200   GLUCOSEU NEGATIVE 11/04/2020 2200   HGBUR NEGATIVE 11/04/2020 2200   BILIRUBINUR NEGATIVE 11/04/2020 2200   KETONESUR 5 (A) 11/04/2020 2200   PROTEINUR NEGATIVE 11/04/2020 2200   NITRITE NEGATIVE 11/04/2020 2200   LEUKOCYTESUR NEGATIVE 11/04/2020 2200   Sepsis Labs: _3 (procalcitonin:4,lacticidven:4)  ) Recent Results (from the past 240 hour(s))  Resp Panel by RT-PCR (Flu A&B, Covid) Nasopharyngeal Swab     Status: None   Collection Time: 11/04/20  4:44 PM   Specimen: Nasopharyngeal Swab; Nasopharyngeal(NP) swabs in vial transport medium  Result Value Ref Range Status   SARS Coronavirus 2 by RT PCR NEGATIVE NEGATIVE Final    Comment: (NOTE) SARS-CoV-2 target nucleic acids are NOT DETECTED.  The SARS-CoV-2 RNA is generally detectable in upper respiratory specimens during the acute phase of infection. The lowest concentration of SARS-CoV-2 viral  copies this assay can detect is 138 copies/mL. A negative result does not preclude SARS-Cov-2 infection and should not be used as the sole basis for treatment or other patient management decisions. A negative result may occur with  improper specimen collection/handling, submission of specimen other than nasopharyngeal swab, presence of viral mutation(s) within the areas targeted by this assay, and inadequate number of viral copies(<138 copies/mL). A negative result must be combined with clinical observations, patient history, and epidemiological information. The expected result is Negative.  Fact Sheet for Patients:  EntrepreneurPulse.com.au  Fact Sheet for Healthcare Providers:  IncredibleEmployment.be  This test is no t yet approved or cleared by the [place] FDA  and  has been authorized for detection and/or diagnosis of SARS-CoV-2 by FDA under an Emergency Use Authorization (EUA). This EUA will remain  in effect (meaning this test can be used) for the duration of the COVID-19 declaration under Section 564(b)(1) of the Act, 21 U.S.C.section 360bbb-3(b)(1), unless the authorization is terminated  or revoked sooner.       Influenza A by PCR NEGATIVE NEGATIVE Final   Influenza B by PCR NEGATIVE NEGATIVE Final    Comment: (NOTE) The Xpert Xpress SARS-CoV-2/FLU/RSV plus assay is intended as an aid in the diagnosis of influenza from Nasopharyngeal swab specimens and should not be used as a sole basis for treatment. Nasal washings and aspirates are unacceptable for Xpert Xpress SARS-CoV-2/FLU/RSV testing.  Fact Sheet for Patients: EntrepreneurPulse.com.au  Fact Sheet for Healthcare Providers: IncredibleEmployment.be  This test is not yet approved or cleared by the [place] FDA and has been authorized for detection and/or diagnosis of SARS-CoV-2 by FDA under an Emergency Use Authorization (EUA). This EUA will remain in effect (meaning this test can be used) for the duration of the COVID-19 declaration under Section 564(b)(1) of the Act, 21 U.S.C. section 360bbb-3(b)(1), unless the authorization is terminated or revoked.  Performed at [facility], [place] [address], [place], [place] [address]     Radiology Studies: MR Brain W Wo Contrast  Result Date: 11/06/2020 CLINICAL DATA:  Recurrent melanoma. EXAM: MRI HEAD WITHOUT AND WITH CONTRAST TECHNIQUE: Multiplanar, multiecho pulse sequences of the brain and surrounding structures were obtained without and with intravenous contrast. CONTRAST:  7m GADAVIST GADOBUTROL 1 MMOL/ML IV SOLN COMPARISON:  None. FINDINGS: Brain: Multiple hemorrhagic metastases are present. Anterior right frontal lobe lesion measures 3.2 x 2.5 x 2.9 cm. An adjacent  nonhemorrhagic lesion measures 11 mm. An additional hemorrhagic lesion more superior and posterior to the first lesion described measures 3.4 x 2.9 x 2.4 cm. There is extensive edema surrounding the constellation of lesions. Two nonhemorrhagic lesions within the right corona radiata measure 4 and 6 mm respectively. Hemorrhagic lesion involves the posterior left lateral ventricle, likely centered on the ependyma. Adjacent left occipital lesion measures 12 mm. A more posterior hemorrhagic occipital lesion measures 6 mm. Hemorrhagic lesion in the left caudate head measures 6 mm. A hemorrhagic lesion in the lateral left temporal lobe measures 16 x 13 x 17 mm. No lesions are present in the posterior fossa. Vasogenic edema is most prominent in the right frontal lobe with effacement the sulci. There is edema in the posterior left lateral ventricle and left occipital lesions. There is also surrounding vasogenic edema of the left temporal lobe lesion. Mild generalized atrophy is present. There is some T2 signal associated with the lesions of the right corona radiata. Internal auditory canals are within normal limits bilaterally. Vascular: Flow is present in the  major intracranial arteries. Skull and upper cervical spine: The craniocervical junction is normal. Upper cervical spine is within normal limits. Marrow signal is unremarkable. Sinuses/Orbits: Small mastoid effusions are present bilaterally without enhancement. No obstructing nasopharyngeal lesion is present. The paranasal sinuses and mastoid air cells are otherwise clear. A right lens replacement is present. Globes and orbits are otherwise within normal limits. IMPRESSION: 1. Multiple bilateral brain metastases. Hemorrhagic lesions are present in the anterior right frontal lobe, left parietoccipital lobe, and left temporal lobe with surrounding vasogenic edema is described. None hemorrhagic lesions are present in the right frontal lobe and right corona radiata. 2.  Mild generalized atrophy. 3. Small mastoid effusions bilaterally without enhancement. No obstructing nasopharyngeal lesion is present. Electronically Signed   By: San Morelle M.D.   On: 11/06/2020 16:19   MR THORACIC SPINE W WO CONTRAST  Result Date: 11/06/2020 CLINICAL DATA:  Metastatic disease. EXAM: MRI THORACIC WITHOUT AND WITH CONTRAST TECHNIQUE: Multiplanar and multiecho pulse sequences of the thoracic spine were obtained without and with intravenous contrast. CONTRAST:  83m GADAVIST GADOBUTROL 1 MMOL/ML IV SOLN COMPARISON:  None. FINDINGS: Alignment:  Normal. Vertebrae: There is a large enhancing lesion in the T11 vertebral body which involves the majority of the vertebral body. This lesion has intrinsic T1 hyperintensity. There is left greater than right ventral epidural extension of tumor which results in moderate left eccentric canal stenosis with flattening of the left ventral cord. Additionally, there is extension of tumor to involve the left posterior elements, including the left pedicle. There are additional metastatic lesions at multiple thoracic levels, including multiple lesions at the T7 (largest lesion measuring 1.3 cm) mild, T9 (largest lesion measuring 1.1 cm), T12 (largest lesion measuring 1.1 cm) and L1 (partially imaged) levels. Additional small lesion at T10 and suspected multiple small lesions at T5. Cord:  Normal cord signal. Paraspinal and other soft tissues: Suspected slight extension of tumor at the T11 level into the adjacent left paraspinal soft tissues. Please see CT chest abdomen pelvis from May Nov 05, 2020 for characterization of additional extra-spinal findings. Disc levels: Stenosis at the T11 and T11-T12 level due to osseous metastatic disease is detailed above. Mild bilateral foraminal stenosis at T10-T11 due to degenerative change. Otherwise, no evidence of significant canal stenosis or foraminal stenosis. IMPRESSION: Multifocal osseous metastases throughout the  thoracic spine, as detailed above. At T11 the metastasis replaces the majority of the vertebral body and extends into the posterior elements and the ventral epidural space with resulting moderate canal stenosis, flattening of the left ventral cord and mild left foraminal stenosis. Electronically Signed   By: FMargaretha SheffieldMD   On: 11/06/2020 17:53   NM Bone Scan Whole Body  Result Date: 11/05/2020 CLINICAL DATA:  Metastatic disease of unknown primary, past history melanoma; groin pain EXAM: NUCLEAR MEDICINE WHOLE BODY BONE SCAN TECHNIQUE: Whole body anterior and posterior images were obtained approximately 3 hours after intravenous injection of radiopharmaceutical. RADIOPHARMACEUTICALS:  21.8 mCi Technetium-[id number] IV COMPARISON:  None Correlation: CT chest abdomen pelvis 11/05/2020 FINDINGS: Uptake in lower thoracic spine at approximately T11, corresponding to lytic lesion on CT. Uptake seen at the RIGHT sacrum, anterior RIGHT iliac bone, less in LEFT iliac, corresponding to metastatic lesions on CT. Focus of abnormal uptake at the distal RIGHT femoral diaphysis concerning for subtle metastasis recommend radiographic correlation. Nonspecific tracer uptake at the intertrochanteric region of LEFT femur without definite osseous lesion by CT. Uptake at medial and mid RIGHT clavicle corresponding to metastases. No  additional worrisome sites of tracer accumulation. Expected urinary tract and soft tissue distribution of tracer. IMPRESSION: Multiple sites of abnormal tracer uptake consistent with osseous metastases, predominantly corresponding with CT abnormalities. Additional sites of uptake are seen at the distal RIGHT femoral diaphysis and at the intertrochanteric region of the LEFT femur concerning for metastases; recommend radiographic correlation of the RIGHT femur to exclude lesion at risk of pathologic fracture. Electronically Signed   By: Lavonia Dana M.D.   On: 11/05/2020 13:52   CT CHEST ABDOMEN PELVIS  W CONTRAST  Result Date: 11/05/2020 CLINICAL DATA:  Metastatic malignancy of unknown primary. History of melanoma six years ago. EXAM: CT CHEST, ABDOMEN, AND PELVIS WITH CONTRAST TECHNIQUE: Multidetector CT imaging of the chest, abdomen and pelvis was performed following the standard protocol during bolus administration of intravenous contrast. CONTRAST:  185m OMNIPAQUE IOHEXOL 300 MG/ML  SOLN COMPARISON:  Outside CT from 10/24/2020 FINDINGS: CT CHEST FINDINGS Cardiovascular: Normal heart size. No pericardial effusion. Aortic atherosclerosis and coronary artery calcifications. Mediastinum/Nodes: No discrete thyroid nodule. No enlarged axillary or supraclavicular lymph nodes. No mediastinal or hilar adenopathy. Lungs/Pleura: Trace left pleural effusion may be partially loculated. Multifocal pulmonary nodules are identified. The majority of these nodules are tiny, less than 4 mm, and too numerous to count. The larger nodules include: -large paravertebral mass within the superior segment of left lower lobe measuring 4.5 by 4.2 by 7.0 cm, image 88/6. -posterior right lower lobe nodule measures 0.9 cm, image 94/6. -posterolateral left upper lobe nodule measures 0.8 cm, image 75/6. -Posterior right upper lobe lung nodule measures 1.1 cm, image 59/6. Musculoskeletal: Multifocal lytic bone metastases are identified. -Expansile lytic lesion involving the head of the right clavicle has associated pathologic fracture. This measures approximately 3.6 x 3.7 cm, image 7/2. -Lytic lesion with large soft tissue component involves the right-side of the sternal manubrium measuring 3.8 x 3.8 cm, image 15/2. -Large permeative lesion involving greater than 50% of the T11 vertebral body is noted measuring at least 3.8 x 3.7 by 2.3 cm. I suspect there is a pathologic fracture involving this vertebra which is vertically oriented extending from the superior to inferior endplate. There is also cortical destruction along the left side of  the vertebral body as well as the posterior cortex, image 136/4 and image 139/5. -Smaller lucent lesion is noted within the smaller lucent lesion within T7 measures 1.3 cm, image 35/2. CT ABDOMEN PELVIS FINDINGS Hepatobiliary: Multiple liver metastases are identified. Index lesion within the dome measures 4.1 x 4.6 cm, image 47/2. Index lesion within inferior right hepatic lobe, segment 6, measures 5.8 x 5.7 cm, image 69/2. Index lesion within lateral segment of left hepatic lobe measures 2.2 x 2.0 cm, image 53/2. Multiple small stones noted within the gallbladder. No gallbladder wall thickening or inflammation. Pancreas: Previously normal appearing body and tail of pancreas now appears edematous with peripancreatic soft tissue stranding, image 63/2. Within the head of pancreas there is a focal area of relative hypoenhancement which measures 2.2 by 3.0 x 2.6 cm, image 75/2 and image 82/4. Similar to the body and tail this area has mild surrounding haziness. No main duct dilatation identified. Spleen: Normal in size without focal abnormality. Adrenals/Urinary Tract: Left adrenal gland appears normal. Nodule in the right adrenal gland measures 3.2 by 1.8 cm, image 61/2. Indeterminate. Cyst arising off the posterior cortex of the upper pole of right kidney measures 5.5 cm, image 66/2. There is an adjacent, mildly hyperdense lesion 3.7 by 2.7 cm, image 71/2.  This measures 25 Hounsfield units and therefore does not meet criteria for simple cysts. Similarly, there is a small exophytic lesion arising off the posterolateral cortex of the inferior pole of right kidney measuring 1.3 cm and 30 Hounsfield units, image 84/2. Tiny exophytic lesion arising off the posterior cortex of the interpolar left kidney is too small to characterize measuring 6 mm, image 79/2. Urinary bladder is unremarkable. Stomach/Bowel: Stomach is nondistended. No dilated loops of large or small bowel. No bowel wall thickening or inflammation. No  obstructing colon mass identified. Vascular/Lymphatic: Aortic atherosclerosis without aneurysm. Enlarged mesenteric lymph nodes are identified including: -central mesenteric node measures 1.5 cm, image 77/2. -also within the central mesentery is a 1.7 cm lymph node, image 81/2. Reproductive: Mild prostate gland enlargement. Other: No ascites. 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 -Index nodule in the left abdomen along the undersurface of the abdominal wall measures 2.1 cm, image 76/2. -Index nodule in the right abdomen anterior to the ascending colon measures 1.6 cm, image 76/2. Multiple retroperitoneal lesions are also identified. Paravertebral mass posterior to the right psoas muscle measures 7.3 x 4.5 cm, image 90/2. -left lower quadrant pericolic gutter nodule measures 1.2 cm, image 97/2 Within the left abdomen lateral to the interpolar left kidney is a nodule measuring 3.5 cm, image 84/2. Musculoskeletal: Multifocal lytic bone metastases are identified. -large lesion involving the right iliopsoas muscle and right iliac bone measures 7.3 x 6.5 cm, image 98/2. Associated permeative changes are noted involving the iliac bone. -Lytic lesion involving the posterior right iliac bone measures 3.8 x 4.7 cm, image 97/2. -lytic lesion involving the left iliac wing measures 3.8 x 2.1 cm. -A few scattered subcutaneous lesions are also identified, including: -soft tissue nodule within the right flank measuring 1.1 cm, image 89/2. -Right gluteal nodule measuring 0.9 cm, image 95/2. -ventral abdominal wall nodule measures 7 mm, image 103/3. IMPRESSION: 1. Multiple pulmonary metastases. Most of these are tiny and too numerous to count. There is a dominant lesion involving the left lower lobe with long axis diameter of 7 cm. 2. Multifocal lytic bone metastases. This includes a large permeative lesion involving the T11 vertebral body which arose through the left lateral wall and posterior  wall of the vertebral body. If there are clinical concerns for canal involvement consider further evaluation with contrast enhanced MRI of the thoracic spine. 3. Multifocal liver metastasis. 4. Peritoneal and retroperitoneal metastasis. 5. Abnormal appearance of the pancreas. New from 10/24/2020 is edema and peripancreatic soft tissue stranding involving the body and tail of pancreas. Although the appearance is consistent with acute pancreatitis metastatic disease is not excluded. 6. Focal masslike enlargement involving the uncinate process of the pancreas which appears slightly hypodense to the adjacent normal parenchyma is indeterminate. This may represent an area of pancreatitis, metastatic disease or a primary pancreatic neoplasm. 7. Indeterminate right adrenal gland nodule. Cannot rule out metastatic disease. 8. There is a indeterminate lesion arising off the posterior cortex of the right kidney which does not meet criteria for a simple cyst. Similarly, there is a small complex lesion arising off the inferior pole of the right kidney. 9. Gallstones. 10. Trace left pleural effusion may be partially loculated. 11. Aortic atherosclerosis and coronary artery calcifications. Aortic Atherosclerosis (ICD10-I70.0). Electronically Signed   By: Kerby Moors M.D.   On: 11/05/2020 13:47   IR US Guide Bx Asp/Drain  Result Date: 11/06/2020 INDICATION: Remote history of melanoma, now with metastatic disease of unknown primary. Please from ultrasound-guided  biopsy of amenable site of malignancy for tissue diagnostic purposes. Additionally, please place image guided Port a catheter for the initiation of chemotherapy. EXAM: 1. ULTRASOUND-GUIDED BIOPSY OF INDETERMINATE SUBCUTANEOUS NODULE ALONG THE RIGHT LATERAL ABDOMINAL WALL 2. IMPLANTED PORT A CATH PLACEMENT WITH ULTRASOUND AND FLUOROSCOPIC GUIDANCE COMPARISON:  CT the chest, abdomen and pelvis-11/05/2020 MEDICATIONS: None. ANESTHESIA/SEDATION: Moderate (conscious)  sedation was employed during this procedure. A total of Versed 3 mg and Fentanyl 100 mcg was administered intravenously. Moderate Sedation Time: 56 minutes. The patient's level of consciousness and vital signs were monitored continuously by radiology nursing throughout the procedure under my direct supervision. CONTRAST:  None FLUOROSCOPY TIME:  36 seconds (60 mGy) COMPLICATIONS: None immediate. PROCEDURE: The procedure, risks, benefits, and alternatives were explained to the patient. Questions regarding the procedure were encouraged and answered. The patient understands and consents to the procedure. Sonographic evaluation was performed of the lateral aspect of the right abdominal wall demonstrating an approximately 1.3 x 0.8 cm hypoechoic nodule correlating with the dominant subcutaneous nodule seen on preceding abdominal CT image 89, series 2. This subcutaneous nodule was targeted for biopsy given location and sonographic window. The skin overlying the nodule was prepped and draped in usual sterile fashion. After the overlying soft tissues were anesthetized 1% lidocaine with epinephrine, 6 core needle biopsy samples were obtained with an 18 gauge core needle biopsy device. Samples were placed in formalin and submitted to pathology for analysis. Attention was now paid towards placement of the port a catheter. The right neck and chest were prepped with chlorhexidine in a sterile fashion, and a sterile drape was applied covering the operative field. Maximum barrier sterile technique with sterile gowns and gloves were used for the procedure. A timeout was performed prior to the initiation of the procedure. Local anesthesia was provided with 1% lidocaine with epinephrine. After creating a small venotomy incision, a micropuncture kit was utilized to access the internal jugular vein. Real-time ultrasound guidance was utilized for vascular access including the acquisition of a permanent ultrasound image documenting  patency of the accessed vessel. The microwire was utilized to measure appropriate catheter length. A subcutaneous port pocket was then created along the upper chest wall utilizing a combination of sharp and blunt dissection. The pocket was irrigated with sterile saline. A single lumen "Slim" sized power injectable port was chosen for placement. The 8 Fr catheter was tunneled from the port pocket site to the venotomy incision. The port was placed in the pocket. The external catheter was trimmed to appropriate length. At the venotomy, an 8 Fr peel-away sheath was placed over a guidewire under fluoroscopic guidance. The catheter was then placed through the sheath and the sheath was removed. Final catheter positioning was confirmed and documented with a fluoroscopic spot radiograph. The port was accessed with a Huber needle, aspirated and flushed with heparinized saline. The venotomy site was closed with an interrupted 4-0 Vicryl suture. The port pocket incision was closed with interrupted 2-0 Vicryl suture. The skin was opposed with a running subcuticular 4-0 Vicryl suture. Dermabond and Steri-strips were applied to both incisions. Dressings were applied. The patient tolerated the procedure well without immediate post procedural complication. FINDINGS: Sonographic evaluation was performed of the lateral aspect of the right abdominal wall demonstrating an approximately 1.3 x 0.8 cm hypoechoic nodule correlating with the dominant subcutaneous nodule seen on preceding abdominal CT image 89, series 2. This nodule was successfully biopsied with ultrasound guidance yielding the acquisition of adequate tissue. After catheter placement, the tip  lies within the superior cavoatrial junction. The catheter aspirates and flushes normally and is ready for immediate use. IMPRESSION: 1. Technically successful ultrasound-guided core needle biopsy of indeterminate subcutaneous nodule within the right lateral abdominal wall. 2.  Successful placement of a right internal jugular approach power injectable Port-A-Cath. The catheter is ready for immediate use. Electronically Signed   By: Sandi Mariscal M.D.   On: 11/06/2020 08:45   IR IMAGING GUIDED PORT INSERTION  Result Date: 11/06/2020 INDICATION: Remote history of melanoma, now with metastatic disease of unknown primary. Please from ultrasound-guided biopsy of amenable site of malignancy for tissue diagnostic purposes. Additionally, please place image guided Port a catheter for the initiation of chemotherapy. EXAM: 1. ULTRASOUND-GUIDED BIOPSY OF INDETERMINATE SUBCUTANEOUS NODULE ALONG THE RIGHT LATERAL ABDOMINAL WALL 2. IMPLANTED PORT A CATH PLACEMENT WITH ULTRASOUND AND FLUOROSCOPIC GUIDANCE COMPARISON:  CT the chest, abdomen and pelvis-11/05/2020 MEDICATIONS: None. ANESTHESIA/SEDATION: Moderate (conscious) sedation was employed during this procedure. A total of Versed 3 mg and Fentanyl 100 mcg was administered intravenously. Moderate Sedation Time: 56 minutes. The patient's level of consciousness and vital signs were monitored continuously by radiology nursing throughout the procedure under my direct supervision. CONTRAST:  None FLUOROSCOPY TIME:  36 seconds (60 mGy) COMPLICATIONS: None immediate. PROCEDURE: The procedure, risks, benefits, and alternatives were explained to the patient. Questions regarding the procedure were encouraged and answered. The patient understands and consents to the procedure. Sonographic evaluation was performed of the lateral aspect of the right abdominal wall demonstrating an approximately 1.3 x 0.8 cm hypoechoic nodule correlating with the dominant subcutaneous nodule seen on preceding abdominal CT image 89, series 2. This subcutaneous nodule was targeted for biopsy given location and sonographic window. The skin overlying the nodule was prepped and draped in usual sterile fashion. After the overlying soft tissues were anesthetized 1% lidocaine with  epinephrine, 6 core needle biopsy samples were obtained with an 18 gauge core needle biopsy device. Samples were placed in formalin and submitted to pathology for analysis. Attention was now paid towards placement of the port a catheter. The right neck and chest were prepped with chlorhexidine in a sterile fashion, and a sterile drape was applied covering the operative field. Maximum barrier sterile technique with sterile gowns and gloves were used for the procedure. A timeout was performed prior to the initiation of the procedure. Local anesthesia was provided with 1% lidocaine with epinephrine. After creating a small venotomy incision, a micropuncture kit was utilized to access the internal jugular vein. Real-time ultrasound guidance was utilized for vascular access including the acquisition of a permanent ultrasound image documenting patency of the accessed vessel. The microwire was utilized to measure appropriate catheter length. A subcutaneous port pocket was then created along the upper chest wall utilizing a combination of sharp and blunt dissection. The pocket was irrigated with sterile saline. A single lumen "Slim" sized power injectable port was chosen for placement. The 8 Fr catheter was tunneled from the port pocket site to the venotomy incision. The port was placed in the pocket. The external catheter was trimmed to appropriate length. At the venotomy, an 8 Fr peel-away sheath was placed over a guidewire under fluoroscopic guidance. The catheter was then placed through the sheath and the sheath was removed. Final catheter positioning was confirmed and documented with a fluoroscopic spot radiograph. The port was accessed with a Huber needle, aspirated and flushed with heparinized saline. The venotomy site was closed with an interrupted 4-0 Vicryl suture. The port pocket incision was closed  with interrupted 2-0 Vicryl suture. The skin was opposed with a running subcuticular 4-0 Vicryl suture. Dermabond  and Steri-strips were applied to both incisions. Dressings were applied. The patient tolerated the procedure well without immediate post procedural complication. FINDINGS: Sonographic evaluation was performed of the lateral aspect of the right abdominal wall demonstrating an approximately 1.3 x 0.8 cm hypoechoic nodule correlating with the dominant subcutaneous nodule seen on preceding abdominal CT image 89, series 2. This nodule was successfully biopsied with ultrasound guidance yielding the acquisition of adequate tissue. After catheter placement, the tip lies within the superior cavoatrial junction. The catheter aspirates and flushes normally and is ready for immediate use. IMPRESSION: 1. Technically successful ultrasound-guided core needle biopsy of indeterminate subcutaneous nodule within the right lateral abdominal wall. 2. Successful placement of a right internal jugular approach power injectable Port-A-Cath. The catheter is ready for immediate use. Electronically Signed   By: Sandi Mariscal M.D.   On: 11/06/2020 08:45    Scheduled Meds: . Chlorhexidine Gluconate Cloth  6 each Topical Daily  . cholecalciferol  2,000 Units Oral Daily  . doxazosin  8 mg Oral QPM  . enoxaparin (LOVENOX) injection  40 mg Subcutaneous Q24H  . feeding supplement  237 mL Oral BID BM  . latanoprost  1 drop Left Eye QHS  . metoprolol tartrate  25 mg Oral BID  . morphine   Intravenous Q4H  . multivitamin with minerals  1 tablet Oral Daily  . pantoprazole  40 mg Oral BID AC  . polyethylene glycol  17 g Oral Daily  . senna-docusate  2 tablet Oral BID  . timolol  1 drop Both Eyes Daily  . cyanocobalamin  1,000 mcg Oral Daily   Continuous Infusions: . dexamethasone (DECADRON) IVPB ([facility]) 20 mg (11/07/20 0831)     LOS: 2 days    Time spent: 64mn  PDomenic Polite MD [facility] 11/07/2020, 12:11 PM

## 2020-11-07 NOTE — Progress Notes (Signed)
Daily Progress Note   Patient Name: Gary Young       Date: 11/07/2020 DOB: May 16, 1951  Age: 70 y.o. MRN#: [id number] Attending Physician: Domenic Polite, MD Primary Care Physician: [name], MD Admit Date: 11/04/2020  Reason for Consultation/Follow-up: Pain control  Subjective:  awake alert, sitting up in chair, wife at bedside, did get some rest last night, now on basal rate low dose on his PCA, pain reasonably controlled.   Length of Stay: 2  Current Medications: Scheduled Meds:  . Chlorhexidine Gluconate Cloth  6 each Topical Daily  . cholecalciferol  2,000 Units Oral Daily  . doxazosin  8 mg Oral QPM  . enoxaparin (LOVENOX) injection  40 mg Subcutaneous Q24H  . feeding supplement  237 mL Oral BID BM  . latanoprost  1 drop Left Eye QHS  . metoprolol tartrate  25 mg Oral BID  . morphine   Intravenous Q4H  . multivitamin with minerals  1 tablet Oral Daily  . pantoprazole  40 mg Oral BID AC  . polyethylene glycol  17 g Oral Daily  . senna-docusate  2 tablet Oral BID  . timolol  1 drop Both Eyes Daily  . cyanocobalamin  1,000 mcg Oral Daily    Continuous Infusions: . dexamethasone (DECADRON) IVPB ([facility]) 20 mg (11/07/20 0831)    PRN Meds: acetaminophen, diphenhydrAMINE **OR** diphenhydrAMINE, hydrOXYzine, labetalol, metoprolol tartrate, naloxone **AND** sodium chloride flush, ondansetron (ZOFRAN) IV, oxyCODONE  Physical Exam         Awake alert Sitting up in bed No distress Regular work of breathing No edema No focal deficits  Vital Signs: BP (!) 143/81 (BP Location: Right Arm)   Pulse 69   Temp 97.6 F (36.4 C) (Oral)   Resp 20   Ht 6' (1.829 m)   Wt 105.4 kg   SpO2 96%   BMI 31.51 kg/m  SpO2: SpO2: 96 % O2 Device: O2 Device: Nasal Cannula O2 Flow  Rate: O2 Flow Rate (L/min): 3 L/min  Intake/output summary:   Intake/Output Summary (Last 24 hours) at 11/07/2020 0912 Last data filed at 11/07/2020 0859 Gross per 24 hour  Intake 240 ml  Output 900 ml  Net -660 ml   LBM: Last BM Date: 11/05/20 Baseline Weight: Weight: 105.7 kg Most recent weight: Weight: 105.4 kg  PPS 60% Palliative Assessment/Data:      Patient Active Problem List   Diagnosis Date Noted  . Nodule of neck   . 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 Malignant melanoma ([place])   . Goals of care, counseling/discussion   . Palliative care by specialist   . Intractable pain 11/04/2020    Palliative Care Assessment & Plan   Patient Profile:    Assessment:  70 y.o. male  with past medical history of melanoma s/p excision, iron deficiency anemia, and moderate protein calorie malnutrition admitted on 11/04/2020 with complaints of weakness and uncontrolled pain.   He was recently found to have metastatic diseaseon CT scan at outside facilityinvolving his lungs, liver, and bones with unknown primary. Suspected to have melanoma, further imaging with MRI indicative of metastatic disease.   Recommendations/Plan:  continue current pain and non pain symptom management over the weekend, to remain on PCA.  Dr Marin Olp following closely, patient aware of need for radiation therapy soon.   Code Status:    Code Status Orders  (From admission, onward)         Start     Ordered   11/04/20 1830  Do not attempt resuscitation (DNR)  Continuous       Question Answer Comment  In the event of cardiac or respiratory ARREST Do not call a "code blue"   In the event of cardiac or respiratory ARREST Do not perform Intubation, CPR, defibrillation or ACLS   In the event of cardiac or respiratory ARREST Use medication by any route, position, wound care, and other measures to relive pain and suffering. May use oxygen, suction and manual treatment of airway obstruction as needed  for comfort.      11/04/20 1829        Code Status History    Date Active Date Inactive Code Status Order ID Comments User Context   11/04/2020 1728 11/04/2020 1829 Full Code [id number]  Kayleen Memos DO ED   Advance Care Planning Activity    Advance Directive Documentation   Flowsheet Row Most Recent Value  Type of Advance Directive Living will, Healthcare Power of Attorney  Pre-existing out of facility DNR order (yellow form or pink MOST form) --  "MOST" Form in Place? --      Prognosis:  Unable to determine  Discharge Planning: To Be Determined  Care plan was discussed with  Patient, wife and also with [facility] MD.   Thank you for allowing the Palliative Medicine Team to assist in the care of this patient.   Time In: 8.30 Time Out: 8.55 Total Time 25 Prolonged Time Billed No       Greater than 50%  of this time was spent counseling and coordinating care related to the above assessment and plan.  Loistine Chance, MD  Please contact Palliative Medicine Team phone at [phone] for questions and concerns.

## 2020-11-07 NOTE — Plan of Care (Signed)

## 2020-11-07 NOTE — Progress Notes (Signed)
I really should not be surprised by the results of the MRI that we did.  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  There is no cord compression although there is some flattening of the left ventral cord.  He has no neurological symptoms.  We do have him on Decadron.  He clearly is going to need radiation therapy.  I think we can hold off until Tuesday.  Again he has no neurological symptoms right now.  I think the Decadron is probably helping with his appetite.  Is probably also help with some of the pain issues with his bony metastasis.  He did get some Zometa.  Everything still is pointing to melanoma.  His laboratory studies show a white count of 16.8.  Hemoglobin 8.9.  Platelet count 352,000.  His albumin is 2.7.  His BUN is 23 creatinine 0.52.  He really needs to try to be little more active now.  Hopefully we can get him up into a chair today.  I think this will help him.  All of his vital signs look stable.  Temperature 97.6.  Pulse 69.  Blood pressure 143/81.  Overall, there is no change in his physical exam.  There is no neurological changes.  Again, although we do not have a tissue diagnosis as of yet, I am positive at this is melanoma.  The 1 issue that we will have to sort out is whether or not the melanoma is BRAF positive.  Again he will need radiation therapy to the brain, spinal cord, and probably bones.  We will keep him on Decadron.  I will make sure that he is on a PPI to try to help with any gastritis.  I know that he will get fantastic care from all the staff on [address] E.  Lattie Haw, MD  [place] 12:12

## 2020-11-08 DIAGNOSIS — Z7189 Other specified counseling: Secondary | ICD-10-CM

## 2020-11-08 DIAGNOSIS — G893 Neoplasm related pain (acute) (chronic): Secondary | ICD-10-CM | POA: Diagnosis not present

## 2020-11-08 DIAGNOSIS — C438 Malignant melanoma of overlapping sites of skin: Secondary | ICD-10-CM | POA: Diagnosis not present

## 2020-11-08 DIAGNOSIS — R52 Pain, unspecified: Secondary | ICD-10-CM | POA: Diagnosis not present

## 2020-11-08 NOTE — Progress Notes (Signed)
PROGRESS NOTE    Gary Young  NLG:[id number] DOB: 03/02/1951 DOA: 11/04/2020 PCP: [name], MD  Brief Narrative: Gary Young is a 70/M with h/o melanoma diagnosed 8 years ago and excised, seasonal allergies, recently diagnosed iron deficiency anemia, who was recently found to have metastatic disease on CT scan at outside facility involving his lungs, liver, and bones with unknown primary.  Onset of symptomatology on 10/11/20 when the patient noted that he was becoming significantly weak with minimal exertion, then developed pelvic pain, referred to orthopedic surgery where a CT abdomen and pelvis with contrast was done on 10/24/2020 revealing extensive metastatic disease affecting his left lower lobe lung, a lesion measuring 3.6 x 4.2 cm, bilateral pulmonary nodules, hepatic metastasis, retroperitoneal masses, soft tissue mass along the right psoas muscle, lytic lesions involving the thoracolumbar spine and bony pelvis.  -Then referred to oncology Dr. Marin Olp.  He was prescribed fentanyl patches and tramadol.  His pain has not been controlled.  Despite his pain management at home he is still having pain so severe he can barely move.  He called hematology oncology's office on the day of admission due to his uncontrolled pain.  Dr. Marin Olp recommended admission for pain control  Assessment & Plan:   Widely metastatic cancer -Previous history of melanoma resected 8 years ago, recent CT from 5/14 revealed extensive metastatic disease affecting his lower lungs, bilateral pulmonary nodules, hepatic metastasis, retroperitoneal masses, soft tissue mass along the right psoas muscle, lytic lesions involving the thoracolumbar spine and bony pelvis.   -Currently doing much better on morphine PCA, hopefully transition to oral meds in the next 48 hours -Dr. Marin Olp following, completed staging CT, Port-A-Cath placement and biopsy of right neck nodule -Pathology still pending -MRI brain with multiple  nonhemorrhagic metastasis, thoracic spinal disease noted as well -Per Dr. Marin Olp , will need XRT, plan to consult radiation oncology after long weekend -Continue to increase activity as tolerated -Palliative team following  Intractable diffuse bones and joints pain -As above, currently on morphine PCA, doing much better after starting basal rate -Continue MiraLAX and Senokot  Hypovolemic hyponatremia -Improving, monitor  Moderate protein calorie malnutrition -Supplements as tolerated  Iron deficiency anemia, suspect related to metastatic disease, likely anemia of chronic disease. -Given IV iron   DVT prophylaxis: Subcu Lovenox daily Code Status: DNR  Family Communication:  Discussed with wife at bedside  Disposition Plan:  Status is: Inpatient  Remains inpatient appropriate because:Inpatient level of care appropriate due to severity of illness   Dispo:  Patient From: Home  Planned Disposition: To be determined  Medically stable for discharge: No     Consultants:   Oncology, palliative care, IR   Procedures:   Antimicrobials:    Subjective: -Feeling better overall, ambulated in the halls yesterday and set up in the chair, had a bowel movement, pain is better controlled  Objective: Vitals:   11/07/20 2346 11/08/20 0323 11/08/20 0517 11/08/20 0519  BP:   (!) 157/94   Pulse:   73   Resp: 14 18 18 18   Temp:   97.7 F (36.5 C)   TempSrc:   Oral   SpO2: 95% 95% 96% 96%  Weight:      Height:        Intake/Output Summary (Last 24 hours) at 11/08/2020 1143 Last data filed at 11/08/2020 0919 Gross per 24 hour  Intake 789.57 ml  Output 500 ml  Net 289.57 ml   Filed Weights   11/04/20 1525 11/05/20 0136  Weight: 105.7 kg 105.4 kg    Examination:  General exam: Obese chronically ill male sitting up at the edge of the bed, AAOx3, no distress HEENT: Right-sided neck nodule, positive pallor CVS: S1-S2, regular rate rhythm Lungs: Decreased breath  sounds to bases Abdomen: Soft, nontender, bowel sounds present Extremities: No edema  Neuro: Mild left lower extremity weakness Skin: No rashes on exposed skin  Data Reviewed:   CBC: Recent Labs  Lab 11/04/20 1547 11/05/20 0512 11/06/20 0557 11/07/20 0431  WBC 12.8* 12.2* 13.7* 16.8*  NEUTROABS 10.0*  --  10.1* 14.9*  HGB 9.2* 8.7* 8.8* 8.9*  HCT 29.9* 28.6* 28.9* 29.1*  MCV 82.4 83.9 84.5 83.4  PLT 409* 365 359 308   Basic Metabolic Panel: Recent Labs  Lab 11/04/20 1547 11/05/20 0512 11/06/20 0557 11/07/20 0431  NA 131* 135 133* 137  K 4.4 4.7 4.4 4.3  CL 93* 94* 93* 97*  CO2 28 34* 29 29  GLUCOSE 133* 133* 106* 203*  BUN 21 22 20 23   CREATININE 0.80 0.78 0.57* 0.52*  CALCIUM 9.2 9.2 9.3 9.3  MG  --  2.0  --   --   PHOS  --  4.5  --   --    GFR: Estimated Creatinine Clearance: 106.3 mL/min (A) (by C-G formula based on SCr of 0.52 mg/dL (L)). Liver Function Tests: Recent Labs  Lab 11/04/20 1547 11/05/20 0512 11/06/20 0557 11/07/20 0431  AST 30 29 26 28   ALT 26 23 23 26   ALKPHOS 109 105 102 105  BILITOT 0.6 0.3 0.7 0.4  PROT 5.9* 5.5* 5.3* 5.8*  ALBUMIN 2.8* 2.7* 2.5* 2.7*   No results for input(s): LIPASE, AMYLASE in the last 168 hours. No results for input(s): AMMONIA in the last 168 hours. Coagulation Profile: No results for input(s): INR, PROTIME in the last 168 hours. Cardiac Enzymes: No results for input(s): CKTOTAL, CKMB, CKMBINDEX, TROPONINI in the last 168 hours. BNP (last 3 results) No results for input(s): PROBNP in the last 8760 hours. HbA1C: No results for input(s): HGBA1C in the last 72 hours. CBG: No results for input(s): GLUCAP in the last 168 hours. Lipid Profile: No results for input(s): CHOL, HDL, LDLCALC, TRIG, CHOLHDL, LDLDIRECT in the last 72 hours. Thyroid Function Tests: No results for input(s): TSH, T4TOTAL, FREET4, T3FREE, THYROIDAB in the last 72 hours. Anemia Panel: Recent Labs    11/06/20 0557  RETICCTPCT 2.8    Urine analysis:    Component Value Date/Time   COLORURINE YELLOW 11/04/2020 2200   APPEARANCEUR HAZY (A) 11/04/2020 2200   LABSPEC 1.026 11/04/2020 2200   PHURINE 5.0 11/04/2020 2200   GLUCOSEU NEGATIVE 11/04/2020 2200   HGBUR NEGATIVE 11/04/2020 2200   BILIRUBINUR NEGATIVE 11/04/2020 2200   KETONESUR 5 (A) 11/04/2020 2200   PROTEINUR NEGATIVE 11/04/2020 2200   NITRITE NEGATIVE 11/04/2020 2200   LEUKOCYTESUR NEGATIVE 11/04/2020 2200   Sepsis Labs: @LABRCNTIP (procalcitonin:4,lacticidven:4)  ) Recent Results (from the past 240 hour(s))  Resp Panel by RT-PCR (Flu A&B, Covid) Nasopharyngeal Swab     Status: None   Collection Time: 11/04/20  4:44 PM   Specimen: Nasopharyngeal Swab; Nasopharyngeal(NP) swabs in vial transport medium  Result Value Ref Range Status   SARS Coronavirus 2 by RT PCR NEGATIVE NEGATIVE Final    Comment: (NOTE) SARS-CoV-2 target nucleic acids are NOT DETECTED.  The SARS-CoV-2 RNA is generally detectable in upper respiratory specimens during the acute phase of infection. The lowest concentration of SARS-CoV-2 viral copies this assay can detect  is 138 copies/mL. A negative result does not preclude SARS-Cov-2 infection and should not be used as the sole basis for treatment or other patient management decisions. A negative result may occur with  improper specimen collection/handling, submission of specimen other than nasopharyngeal swab, presence of viral mutation(s) within the areas targeted by this assay, and inadequate number of viral copies(<138 copies/mL). A negative result must be combined with clinical observations, patient history, and epidemiological information. The expected result is Negative.  Fact Sheet for Patients:  EntrepreneurPulse.com.au  Fact Sheet for Healthcare Providers:  IncredibleEmployment.be  This test is no t yet approved or cleared by the [place] FDA and  has been authorized for  detection and/or diagnosis of SARS-CoV-2 by FDA under an Emergency Use Authorization (EUA). This EUA will remain  in effect (meaning this test can be used) for the duration of the COVID-19 declaration under Section 564(b)(1) of the Act, 21 U.S.C.section 360bbb-3(b)(1), unless the authorization is terminated  or revoked sooner.       Influenza A by PCR NEGATIVE NEGATIVE Final   Influenza B by PCR NEGATIVE NEGATIVE Final    Comment: (NOTE) The Xpert Xpress SARS-CoV-2/FLU/RSV plus assay is intended as an aid in the diagnosis of influenza from Nasopharyngeal swab specimens and should not be used as a sole basis for treatment. Nasal washings and aspirates are unacceptable for Xpert Xpress SARS-CoV-2/FLU/RSV testing.  Fact Sheet for Patients: EntrepreneurPulse.com.au  Fact Sheet for Healthcare Providers: IncredibleEmployment.be  This test is not yet approved or cleared by the [place] FDA and has been authorized for detection and/or diagnosis of SARS-CoV-2 by FDA under an Emergency Use Authorization (EUA). This EUA will remain in effect (meaning this test can be used) for the duration of the COVID-19 declaration under Section 564(b)(1) of the Act, 21 U.S.C. section 360bbb-3(b)(1), unless the authorization is terminated or revoked.  Performed at [facility], [place] [address], [place], [place] [address]     Radiology Studies: MR Brain W Wo Contrast  Result Date: 11/06/2020 CLINICAL DATA:  Recurrent melanoma. EXAM: MRI HEAD WITHOUT AND WITH CONTRAST TECHNIQUE: Multiplanar, multiecho pulse sequences of the brain and surrounding structures were obtained without and with intravenous contrast. CONTRAST:  74mL GADAVIST GADOBUTROL 1 MMOL/ML IV SOLN COMPARISON:  None. FINDINGS: Brain: Multiple hemorrhagic metastases are present. Anterior right frontal lobe lesion measures 3.2 x 2.5 x 2.9 cm. An adjacent nonhemorrhagic lesion measures 11  mm. An additional hemorrhagic lesion more superior and posterior to the first lesion described measures 3.4 x 2.9 x 2.4 cm. There is extensive edema surrounding the constellation of lesions. Two nonhemorrhagic lesions within the right corona radiata measure 4 and 6 mm respectively. Hemorrhagic lesion involves the posterior left lateral ventricle, likely centered on the ependyma. Adjacent left occipital lesion measures 12 mm. A more posterior hemorrhagic occipital lesion measures 6 mm. Hemorrhagic lesion in the left caudate head measures 6 mm. A hemorrhagic lesion in the lateral left temporal lobe measures 16 x 13 x 17 mm. No lesions are present in the posterior fossa. Vasogenic edema is most prominent in the right frontal lobe with effacement the sulci. There is edema in the posterior left lateral ventricle and left occipital lesions. There is also surrounding vasogenic edema of the left temporal lobe lesion. Mild generalized atrophy is present. There is some T2 signal associated with the lesions of the right corona radiata. Internal auditory canals are within normal limits bilaterally. Vascular: Flow is present in the major intracranial arteries. Skull and  upper cervical spine: The craniocervical junction is normal. Upper cervical spine is within normal limits. Marrow signal is unremarkable. Sinuses/Orbits: Small mastoid effusions are present bilaterally without enhancement. No obstructing nasopharyngeal lesion is present. The paranasal sinuses and mastoid air cells are otherwise clear. A right lens replacement is present. Globes and orbits are otherwise within normal limits. IMPRESSION: 1. Multiple bilateral brain metastases. Hemorrhagic lesions are present in the anterior right frontal lobe, left parietoccipital lobe, and left temporal lobe with surrounding vasogenic edema is described. None hemorrhagic lesions are present in the right frontal lobe and right corona radiata. 2. Mild generalized atrophy. 3. Small  mastoid effusions bilaterally without enhancement. No obstructing nasopharyngeal lesion is present. Electronically Signed   By: San Morelle M.D.   On: 11/06/2020 16:19   MR THORACIC SPINE W WO CONTRAST  Result Date: 11/06/2020 CLINICAL DATA:  Metastatic disease. EXAM: MRI THORACIC WITHOUT AND WITH CONTRAST TECHNIQUE: Multiplanar and multiecho pulse sequences of the thoracic spine were obtained without and with intravenous contrast. CONTRAST:  71mL GADAVIST GADOBUTROL 1 MMOL/ML IV SOLN COMPARISON:  None. FINDINGS: Alignment:  Normal. Vertebrae: There is a large enhancing lesion in the T11 vertebral body which involves the majority of the vertebral body. This lesion has intrinsic T1 hyperintensity. There is left greater than right ventral epidural extension of tumor which results in moderate left eccentric canal stenosis with flattening of the left ventral cord. Additionally, there is extension of tumor to involve the left posterior elements, including the left pedicle. There are additional metastatic lesions at multiple thoracic levels, including multiple lesions at the T7 (largest lesion measuring 1.3 cm) mild, T9 (largest lesion measuring 1.1 cm), T12 (largest lesion measuring 1.1 cm) and L1 (partially imaged) levels. Additional small lesion at T10 and suspected multiple small lesions at T5. Cord:  Normal cord signal. Paraspinal and other soft tissues: Suspected slight extension of tumor at the T11 level into the adjacent left paraspinal soft tissues. Please see CT chest abdomen pelvis from May Nov 05, 2020 for characterization of additional extra-spinal findings. Disc levels: Stenosis at the T11 and T11-T12 level due to osseous metastatic disease is detailed above. Mild bilateral foraminal stenosis at T10-T11 due to degenerative change. Otherwise, no evidence of significant canal stenosis or foraminal stenosis. IMPRESSION: Multifocal osseous metastases throughout the thoracic spine, as detailed above.  At T11 the metastasis replaces the majority of the vertebral body and extends into the posterior elements and the ventral epidural space with resulting moderate canal stenosis, flattening of the left ventral cord and mild left foraminal stenosis. Electronically Signed   By: Margaretha Sheffield MD   On: 11/06/2020 17:53    Scheduled Meds: . Chlorhexidine Gluconate Cloth  6 each Topical Daily  . cholecalciferol  2,000 Units Oral Daily  . doxazosin  8 mg Oral QPM  . enoxaparin (LOVENOX) injection  40 mg Subcutaneous Q24H  . feeding supplement  237 mL Oral BID BM  . latanoprost  1 drop Left Eye QHS  . metoprolol tartrate  25 mg Oral BID  . morphine   Intravenous Q4H  . multivitamin with minerals  1 tablet Oral Daily  . pantoprazole  40 mg Oral BID AC  . polyethylene glycol  17 g Oral Daily  . senna-docusate  2 tablet Oral BID  . timolol  1 drop Both Eyes Daily  . cyanocobalamin  1,000 mcg Oral Daily   Continuous Infusions: . dexamethasone (DECADRON) IVPB ([facility]) 20 mg (11/08/20 0910)     LOS: 3 days  Time spent: 29min  Domenic Polite, MD [facility] 11/08/2020, 11:43 AM

## 2020-11-09 DIAGNOSIS — Z7189 Other specified counseling: Secondary | ICD-10-CM | POA: Diagnosis not present

## 2020-11-09 DIAGNOSIS — Z85820 Personal history of malignant melanoma of skin: Secondary | ICD-10-CM | POA: Diagnosis not present

## 2020-11-09 DIAGNOSIS — G893 Neoplasm related pain (acute) (chronic): Secondary | ICD-10-CM | POA: Diagnosis not present

## 2020-11-09 DIAGNOSIS — R52 Pain, unspecified: Secondary | ICD-10-CM | POA: Diagnosis not present

## 2020-11-09 DIAGNOSIS — C438 Malignant melanoma of overlapping sites of skin: Secondary | ICD-10-CM | POA: Diagnosis not present

## 2020-11-09 DIAGNOSIS — R160 Hepatomegaly, not elsewhere classified: Secondary | ICD-10-CM

## 2020-11-09 DIAGNOSIS — C801 Malignant (primary) neoplasm, unspecified: Secondary | ICD-10-CM | POA: Diagnosis not present

## 2020-11-09 LAB — CBC
HCT: 33.3 % — ABNORMAL LOW (ref 39.0–52.0)
Hemoglobin: 9.9 g/dL — ABNORMAL LOW (ref 13.0–17.0)
MCH: 25.4 pg — ABNORMAL LOW (ref 26.0–34.0)
MCHC: 29.7 g/dL — ABNORMAL LOW (ref 30.0–36.0)
MCV: 85.4 fL (ref 80.0–100.0)
Platelets: 342 10*3/uL (ref 150–400)
RBC: 3.9 MIL/uL — ABNORMAL LOW (ref 4.22–5.81)
RDW: 17.5 % — ABNORMAL HIGH (ref 11.5–15.5)
WBC: 20.3 10*3/uL — ABNORMAL HIGH (ref 4.0–10.5)
nRBC: 0.1 % (ref 0.0–0.2)

## 2020-11-09 MED ORDER — SIMETHICONE 80 MG PO CHEW
80.0000 mg | CHEWABLE_TABLET | Freq: Four times a day (QID) | ORAL | Status: DC | PRN
  Administered 2020-11-09 – 2020-11-13 (×6): 80 mg via ORAL
  Filled 2020-11-09 (×6): qty 1

## 2020-11-09 MED ORDER — TEMAZEPAM 15 MG PO CAPS
15.0000 mg | ORAL_CAPSULE | Freq: Every day | ORAL | Status: DC
Start: 2020-11-09 — End: 2020-11-13
  Administered 2020-11-09 – 2020-11-12 (×4): 15 mg via ORAL
  Filled 2020-11-09 (×4): qty 1

## 2020-11-09 MED ORDER — OXYCODONE HCL ER 20 MG PO T12A
20.0000 mg | EXTENDED_RELEASE_TABLET | Freq: Two times a day (BID) | ORAL | Status: DC
  Administered 2020-11-09 – 2020-11-13 (×8): 20 mg via ORAL
  Filled 2020-11-09 (×9): qty 1

## 2020-11-09 MED ORDER — OXYCODONE HCL 5 MG PO TABS
10.0000 mg | ORAL_TABLET | ORAL | Status: DC | PRN
  Administered 2020-11-09 – 2020-11-13 (×9): 10 mg via ORAL
  Filled 2020-11-09 (×10): qty 2

## 2020-11-09 NOTE — Progress Notes (Signed)
Mr. Weatherholtz is about the same.  I think it would benefit him to get him off the PCA and the oxygen monitor and system and the cardiac monitoring system.  He really does not like all these wires on him.  We will see about switch him over to an oral long-acting medication-OxyContin.  We will increase the short-term oxycodone.  Hopefully this will help him and will help improve his mental state.  He is not sleeping well.  I will try him on some Restoril.  We will have to let Radiation Oncology know his situation so they can start radiation on him.  He had lab work done, but the results were not back yet.  He is eating okay.  I am sure the Decadron is probably helping with this.  He would like to sit up in the chair a little bit more.  There is no obvious neurological issues.  His pain mostly is in the hip area.  I am sure this is from his bony metastasis.  His temperature is 97.5.  Pulse 80.  Blood pressure 152/96.  His lungs are clear bilaterally.  There is no oral thrush.  Cardiac exam regular rate and rhythm.  Abdomen is soft.  There is no neurological findings.  Again, will try to transition over to an oral pain regimen for him.  I think this would be reasonable.  I think this having all the wires off and will make him feel better.  I know this is a tough situation.  We are still awaiting the final pathology report but I have to believe we are looking at melanoma that is come back.  As far as systemic therapy is concerned, there is a new immunotherapy combination that is showing some very promising results.  I will have to think about this.  We will have to see what the BRAF studies are.  This will be very important.  I do appreciate the outstanding care he is getting from all staff up on [address].  Lattie Haw, MD  John 15:13

## 2020-11-09 NOTE — Progress Notes (Signed)
Morphine PCA - 1 mg waste, new syringe replaced. Patty, RN verified.

## 2020-11-09 NOTE — Progress Notes (Signed)
PROGRESS NOTE    [name]  NAT:[id number] DOB: 05-09-1951 DOA: 11/04/2020 PCP: [name], MD  Brief Narrative: Gary Young is a 89/M with h/o melanoma diagnosed 8 years ago and excised, seasonal allergies, recently diagnosed iron deficiency anemia, who was recently found to have metastatic disease on CT scan at outside facility involving his lungs, liver, and bones with unknown primary.  Onset of symptomatology on 10/11/20 when the patient noted that he was becoming significantly weak with minimal exertion, then developed pelvic pain, referred to orthopedic surgery where a CT abdomen and pelvis with contrast was done on 10/24/2020 revealing extensive metastatic disease affecting his left lower lobe lung, a lesion measuring 3.6 x 4.2 cm, bilateral pulmonary nodules, hepatic metastasis, retroperitoneal masses, soft tissue mass along the right psoas muscle, lytic lesions involving the thoracolumbar spine and bony pelvis.  -Then referred to oncology Dr. Marin Olp.  He was prescribed fentanyl patches and tramadol.  His pain has not been controlled.  Despite his pain management at home he is still having pain so severe he can barely move.  He called hematology oncology's office on the day of admission due to his uncontrolled pain.  Dr. Marin Olp recommended admission for pain control  Assessment & Plan:   Widely metastatic cancer Brain metastasis -Previous history of melanoma resected 8 years ago, recent CT from 5/14 revealed extensive metastatic disease affecting his lower lungs, hepatic metastasis, retroperitoneal masses, soft tissue mass along the right psoas muscle, lytic lesions involving the thoracolumbar spine and bony pelvis.   -Clinically much improved symptom wise on morphine PCA, plan to transition to oral pain medications today, OxyContin and oxycodone, continue laxatives -Dr. Marin Olp following, completed staging CT, Port-A-Cath placement and biopsy of right neck nodule -Pathology still  pending -MRI brain with multiple nonhemorrhagic metastasis, thoracic spinal disease noted as well -Per Dr. Marin Olp , will need XRT, plan to consult radiation oncology tomorrow -Palliative care team following, continue physical therapy as tolerated  Intractable diffuse bones and joints pain -As above, currently on morphine PCA, doing much better after starting basal rate -Continue MiraLAX and Senokot  Hypovolemic hyponatremia -Improving, monitor  Moderate protein calorie malnutrition -Supplements as tolerated  Iron deficiency anemia, suspect related to metastatic disease, likely anemia of chronic disease. -Given IV iron  Leukocytosis -Suspected to be secondary to advanced cancer and IV steroids, afebrile nontoxic, continue to monitor   DVT prophylaxis: Subcu Lovenox daily Code Status: DNR  Family Communication:  Discussed with wife at bedside  Disposition Plan:  Status is: Inpatient  Remains inpatient appropriate because:Inpatient level of care appropriate due to severity of illness   Dispo:  Patient From: Home  Planned Disposition: To be determined  Medically stable for discharge: No     Consultants:   Oncology, palliative care, IR   Procedures:   Antimicrobials:    Subjective: -Feels okay overall, discussed with Dr. Marin Olp this morning  Objective: Vitals:   11/09/20 0140 11/09/20 0315 11/09/20 0626 11/09/20 1322  BP:   (!) 152/96 (!) 164/95  Pulse:   80 72  Resp: 19 18 17 16   Temp:   (!) 97.5 F (36.4 C) (!) 97.4 F (36.3 C)  TempSrc:   Oral Oral  SpO2: 99% 99% 95% 97%  Weight:      Height:        Intake/Output Summary (Last 24 hours) at 11/09/2020 1333 Last data filed at 11/08/2020 1745 Gross per 24 hour  Intake 240 ml  Output 300 ml  Net -60 ml  Filed Weights   11/04/20 1525 11/05/20 0136  Weight: 105.7 kg 105.4 kg    Examination:  General exam: Obese chronically ill male sitting up in bed, AAOx3, no distress HEENT:  Right-sided neck nodule, positive pallor CVS: S1-S2, regular rate rhythm Lungs: Decreased breath sounds at the bases Abdomen: Soft, nontender, bowel sounds present Extremities: No edema  Neuro: Mild left lower extremity weakness Skin: No rashes on exposed skin  Data Reviewed:   CBC: Recent Labs  Lab 11/04/20 1547 11/05/20 0512 11/06/20 0557 11/07/20 0431 11/09/20 0754  WBC 12.8* 12.2* 13.7* 16.8* 20.3*  NEUTROABS 10.0*  --  10.1* 14.9*  --   HGB 9.2* 8.7* 8.8* 8.9* 9.9*  HCT 29.9* 28.6* 28.9* 29.1* 33.3*  MCV 82.4 83.9 84.5 83.4 85.4  PLT 409* 365 359 352 726   Basic Metabolic Panel: Recent Labs  Lab 11/04/20 1547 11/05/20 0512 11/06/20 0557 11/07/20 0431  NA 131* 135 133* 137  K 4.4 4.7 4.4 4.3  CL 93* 94* 93* 97*  CO2 28 34* 29 29  GLUCOSE 133* 133* 106* 203*  BUN 21 22 20 23   CREATININE 0.80 0.78 0.57* 0.52*  CALCIUM 9.2 9.2 9.3 9.3  MG  --  2.0  --   --   PHOS  --  4.5  --   --    GFR: Estimated Creatinine Clearance: 106.3 mL/min (A) (by C-G formula based on SCr of 0.52 mg/dL (L)). Liver Function Tests: Recent Labs  Lab 11/04/20 1547 11/05/20 0512 11/06/20 0557 11/07/20 0431  AST 30 29 26 28   ALT 26 23 23 26   ALKPHOS 109 105 102 105  BILITOT 0.6 0.3 0.7 0.4  PROT 5.9* 5.5* 5.3* 5.8*  ALBUMIN 2.8* 2.7* 2.5* 2.7*   No results for input(s): LIPASE, AMYLASE in the last 168 hours. No results for input(s): AMMONIA in the last 168 hours. Coagulation Profile: No results for input(s): INR, PROTIME in the last 168 hours. Cardiac Enzymes: No results for input(s): CKTOTAL, CKMB, CKMBINDEX, TROPONINI in the last 168 hours. BNP (last 3 results) No results for input(s): PROBNP in the last 8760 hours. HbA1C: No results for input(s): HGBA1C in the last 72 hours. CBG: No results for input(s): GLUCAP in the last 168 hours. Lipid Profile: No results for input(s): CHOL, HDL, LDLCALC, TRIG, CHOLHDL, LDLDIRECT in the last 72 hours. Thyroid Function Tests: No  results for input(s): TSH, T4TOTAL, FREET4, T3FREE, THYROIDAB in the last 72 hours. Anemia Panel: No results for input(s): VITAMINB12, FOLATE, FERRITIN, TIBC, IRON, RETICCTPCT in the last 72 hours. Urine analysis:    Component Value Date/Time   COLORURINE YELLOW 11/04/2020 2200   APPEARANCEUR HAZY (A) 11/04/2020 2200   LABSPEC 1.026 11/04/2020 2200   PHURINE 5.0 11/04/2020 2200   GLUCOSEU NEGATIVE 11/04/2020 2200   HGBUR NEGATIVE 11/04/2020 2200   BILIRUBINUR NEGATIVE 11/04/2020 2200   KETONESUR 5 (A) 11/04/2020 2200   PROTEINUR NEGATIVE 11/04/2020 2200   NITRITE NEGATIVE 11/04/2020 2200   LEUKOCYTESUR NEGATIVE 11/04/2020 2200   Sepsis Labs: @LABRCNTIP (procalcitonin:4,lacticidven:4)  ) Recent Results (from the past 240 hour(s))  Resp Panel by RT-PCR (Flu A&B, Covid) Nasopharyngeal Swab     Status: None   Collection Time: 11/04/20  4:44 PM   Specimen: Nasopharyngeal Swab; Nasopharyngeal(NP) swabs in vial transport medium  Result Value Ref Range Status   SARS Coronavirus 2 by RT PCR NEGATIVE NEGATIVE Final    Comment: (NOTE) SARS-CoV-2 target nucleic acids are NOT DETECTED.  The SARS-CoV-2 RNA is generally detectable in upper  respiratory specimens during the acute phase of infection. The lowest concentration of SARS-CoV-2 viral copies this assay can detect is 138 copies/mL. A negative result does not preclude SARS-Cov-2 infection and should not be used as the sole basis for treatment or other patient management decisions. A negative result may occur with  improper specimen collection/handling, submission of specimen other than nasopharyngeal swab, presence of viral mutation(s) within the areas targeted by this assay, and inadequate number of viral copies(<138 copies/mL). A negative result must be combined with clinical observations, patient history, and epidemiological information. The expected result is Negative.  Fact Sheet for Patients:   EntrepreneurPulse.com.au  Fact Sheet for Healthcare Providers:  IncredibleEmployment.be  This test is no t yet approved or cleared by the [place] FDA and  has been authorized for detection and/or diagnosis of SARS-CoV-2 by FDA under an Emergency Use Authorization (EUA). This EUA will remain  in effect (meaning this test can be used) for the duration of the COVID-19 declaration under Section 564(b)(1) of the Act, 21 U.S.C.section 360bbb-3(b)(1), unless the authorization is terminated  or revoked sooner.       Influenza A by PCR NEGATIVE NEGATIVE Final   Influenza B by PCR NEGATIVE NEGATIVE Final    Comment: (NOTE) The Xpert Xpress SARS-CoV-2/FLU/RSV plus assay is intended as an aid in the diagnosis of influenza from Nasopharyngeal swab specimens and should not be used as a sole basis for treatment. Nasal washings and aspirates are unacceptable for Xpert Xpress SARS-CoV-2/FLU/RSV testing.  Fact Sheet for Patients: EntrepreneurPulse.com.au  Fact Sheet for Healthcare Providers: IncredibleEmployment.be  This test is not yet approved or cleared by the [place] FDA and has been authorized for detection and/or diagnosis of SARS-CoV-2 by FDA under an Emergency Use Authorization (EUA). This EUA will remain in effect (meaning this test can be used) for the duration of the COVID-19 declaration under Section 564(b)(1) of the Act, 21 U.S.C. section 360bbb-3(b)(1), unless the authorization is terminated or revoked.  Performed at [facility], [place] [address] [place], [place] [address]     Radiology Studies: No results found.  Scheduled Meds: . Chlorhexidine Gluconate Cloth  6 each Topical Daily  . cholecalciferol  2,000 Units Oral Daily  . doxazosin  8 mg Oral QPM  . enoxaparin (LOVENOX) injection  40 mg Subcutaneous Q24H  . feeding supplement  237 mL Oral BID BM  . latanoprost  1  drop Left Eye QHS  . metoprolol tartrate  25 mg Oral BID  . multivitamin with minerals  1 tablet Oral Daily  . oxyCODONE  20 mg Oral Q12H  . pantoprazole  40 mg Oral BID AC  . polyethylene glycol  17 g Oral Daily  . senna-docusate  2 tablet Oral BID  . temazepam  15 mg Oral QHS  . timolol  1 drop Both Eyes Daily  . cyanocobalamin  1,000 mcg Oral Daily   Continuous Infusions: . dexamethasone (DECADRON) IVPB ([facility]) 20 mg (11/09/20 0745)     LOS: 4 days    Time spent: 48min  Domenic Polite, MD [facility] 11/09/2020, 1:33 PM

## 2020-11-09 NOTE — Care Management Important Message (Signed)
Medicare IM given to the patient by Marticia Reifschneider. 

## 2020-11-09 NOTE — Progress Notes (Signed)
Physical Therapy Treatment Patient Details Name: Gary Young MRN: [id number] DOB: [birth year] Today's Date: 11/09/2020    History of Present Illness Pt is 70 yo male w/ h/o melanoma 8 years ago that was excised.  Pt was found to have extensive metastatic CA on 10/24/20.   Pt admitted on 11/04/20 with widely metastatic CA, intractable bone and joint pain and hyponatremia.  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 Per oncology note likely pathological fx of clavicle on R.   Pt has been started on decadron and PCA pump.    PT Comments    Pt continues to participate well. O2 95% on 3L. Pain rated 3/10 with activity during session (shoulder and groin areas). Will continue to follow and progress activity as tolerated.   Follow Up Recommendations  Home health PT;Supervision/Assistance - 24 hour     Equipment Recommendations  None recommended by PT    Recommendations for Other Services       Precautions / Restrictions Precautions Precautions: Fall Restrictions Weight Bearing Restrictions: No    Mobility  Bed Mobility               General bed mobility comments: sitting EOB    Transfers Overall transfer level: Needs assistance Equipment used: Rolling walker (2 wheeled) Transfers: Sit to/from Stand Sit to Stand: Min guard;From elevated surface         General transfer comment: min guard for safety, pulled up on walker  Ambulation/Gait Ambulation/Gait assistance: Min guard Gait Distance (Feet): 75 Feet Assistive device: Rolling walker (2 wheeled) Gait Pattern/deviations: Step-through pattern;Decreased stride length     General Gait Details: Mild instability in knees but no buckling occurred. Cues for RW proximity particularly with turns. Tolerated distance well with 1 brief standing rest break. Remained on [place] O2-sats 95% on 3L   Stairs             Wheelchair Mobility    Modified Rankin (Stroke  Patients Only)       Balance Overall balance assessment: Needs assistance         Standing balance support: Bilateral upper extremity supported Standing balance-Leahy Scale: Fair                              Cognition Arousal/Alertness: Awake/alert Behavior During Therapy: WFL for tasks assessed/performed Overall Cognitive Status: Within Functional Limits for tasks assessed                                        Exercises      General Comments        Pertinent Vitals/Pain Pain Assessment: 0-10 Pain Score: 3  Pain Location: R shoulder, bil groin areas Pain Descriptors / Indicators: Discomfort;Sore Pain Intervention(s): Limited activity within patient's tolerance;Monitored during session;Repositioned    Home Living                      Prior Function            PT Goals (current goals can now be found in the care plan section) Progress towards PT goals: Progressing toward goals    Frequency    Min 3X/week      PT Plan Current plan remains appropriate    Co-evaluation  AM-PAC PT "6 Clicks" Mobility   Outcome Measure  Help needed turning from your back to your side while in a flat bed without using bedrails?: A Little Help needed moving from lying on your back to sitting on the side of a flat bed without using bedrails?: A Little Help needed moving to and from a bed to a chair (including a wheelchair)?: A Little Help needed standing up from a chair using your arms (e.g., wheelchair or bedside chair)?: A Little Help needed to walk in hospital room?: A Little Help needed climbing 3-5 steps with a railing? : A Lot 6 Click Score: 17    End of Session Equipment Utilized During Treatment: Gait belt;Oxygen Activity Tolerance: Patient tolerated treatment well Patient left: in chair;with call bell/phone within reach;with family/visitor present   PT Visit Diagnosis: Unsteadiness on feet (R26.81);Muscle  weakness (generalized) (M62.81)     Time: 1427-6701 PT Time Calculation (min) (ACUTE ONLY): 22 min  Charges:  $Gait Training: 8-22 mins                         Doreatha Massed, PT Acute Rehabilitation  Office: [phone] Pager: [phone]

## 2020-11-09 NOTE — Progress Notes (Signed)
Daily Progress Note   Patient Name: Gary Young       Date: 11/09/2020 DOB: 10-01-1950  Age: 70 y.o. MRN#: [id number] Attending Physician: Domenic Polite, MD Primary Care Physician: [name], MD Admit Date: 11/04/2020  Reason for Consultation/Follow-up: Establishing goals of care and Pain control  Subjective: Medical records reviewed. Assessed patient at the bedside and met with wife [name] at the bedside. Gary Young is resting comfortable, awakens briefly to give thumbs up and returns to sleep.  [name] shares that they are currently awaiting to hear from radiation oncology for next steps, likely tomorrow, and anticipate around 10 radiation treatments. They remain hopeful that Gary Young will be strong enough to return home for additional care. She also shares that she would prefer his discharge to home sooner rather than later, as this would be more beneficial for his mental health and sense of dignity. He did well with PT over the weekend. He is transitioning off PCA and she hopes that pain remains well-controlled. Educated on the availability of PRNs in additional to long-acting medications.   Questions and concerns addressed. PMT will continue to support holistically.   Length of Stay: 4  Current Medications: Scheduled Meds:  . Chlorhexidine Gluconate Cloth  6 each Topical Daily  . cholecalciferol  2,000 Units Oral Daily  . doxazosin  8 mg Oral QPM  . enoxaparin (LOVENOX) injection  40 mg Subcutaneous Q24H  . feeding supplement  237 mL Oral BID BM  . latanoprost  1 drop Left Eye QHS  . metoprolol tartrate  25 mg Oral BID  . multivitamin with minerals  1 tablet Oral Daily  . oxyCODONE  20 mg Oral Q12H  . pantoprazole  40 mg Oral BID AC  . polyethylene glycol  17 g Oral Daily  .  senna-docusate  2 tablet Oral BID  . temazepam  15 mg Oral QHS  . timolol  1 drop Both Eyes Daily  . cyanocobalamin  1,000 mcg Oral Daily    Continuous Infusions: . dexamethasone (DECADRON) IVPB ([facility]) 20 mg (11/09/20 0745)    PRN Meds: acetaminophen, hydrOXYzine, labetalol, metoprolol tartrate, oxyCODONE  Physical Exam Vitals and nursing note reviewed.  Constitutional:      General: He is sleeping.     Interventions: Nasal cannula in place.     Comments: 3L  Cardiovascular:     Rate and Rhythm: Normal rate.  Pulmonary:     Effort: Pulmonary effort is normal.  Neurological:     Mental Status: He is oriented to person, place, and time and easily aroused.             Vital Signs: BP (!) 164/95 (BP Location: Left Arm)   Pulse 72   Temp (!) 97.4 F (36.3 C) (Oral)   Resp 16   Ht 6' (1.829 m)   Wt 105.4 kg   SpO2 97%   BMI 31.51 kg/m  SpO2: SpO2: 97 % O2 Device: O2 Device: Nasal Cannula O2 Flow Rate: O2 Flow Rate (L/min): 3 L/min  Intake/output summary:   Intake/Output Summary (Last 24 hours) at 11/09/2020 1409 Last data filed at 11/09/2020 0930 Gross per 24 hour  Intake 360 ml  Output 300 ml  Net 60 ml   LBM: Last BM Date: 11/08/20 Baseline Weight: Weight: 105.7 kg Most recent weight: Weight: 105.4 kg       Palliative Assessment/Data: 60%     Patient Active Problem List   Diagnosis Date Noted  . Nodule of neck   . 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 Malignant melanoma ([place])   . Goals of care, counseling/discussion   . Palliative care by specialist   . Intractable pain 11/04/2020    Palliative Care Assessment & Plan   Patient Profile: 70 y.o. male  with past medical history of melanoma s/p excision, iron deficiency anemia, and moderate protein calorie malnutrition admitted on 11/04/2020 with complaints of weakness and uncontrolled pain.   He was recently found to have metastatic diseaseon CT scan at outside facilityinvolving his lungs, liver, and  bones with unknown primary. Referred to Dr. Marin Olp and prescribed fentanyl patched and tramadol, without relief.   Assessment: Metastatic cancer, likely melanoma, mets to lungs, liver, brain Cancer-associated pain  Recommendations/Plan: Transitioned from morphine PCA to PO OxyContin BID and PO oxycodone PRN per Dr. Marin Olp Continue current interventions, awaiting radiology oncology recommendations tomorrow Educated on availability of PRN oxycodone and adjustability of long-acting pain medication as needed PMT will provide ongoing support and coordination of care  Goals of Care and Additional Recommendations: Limitations on Scope of Treatment: Full Scope Treatment  Code Status: DNR/DNI   Code Status Orders  (From admission, onward)         Start     Ordered   11/04/20 1830  Do not attempt resuscitation (DNR)  Continuous       Question Answer Comment  In the event of cardiac or respiratory ARREST Do not call a "code blue"   In the event of cardiac or respiratory ARREST Do not perform Intubation, CPR, defibrillation or ACLS   In the event of cardiac or respiratory ARREST Use medication by any route, position, wound care, and other measures to relive pain and suffering. May use oxygen, suction and manual treatment of airway obstruction as needed for comfort.      11/04/20 1829        Code Status History    Date Active Date Inactive Code Status Order ID Comments User Context   11/04/2020 1728 11/04/2020 1829 Full Code [id number]  Kayleen Memos DO ED   Advance Care Planning Activity    Advance Directive Documentation   Flowsheet Row Most Recent Value  Type of Advance Directive Living will, Healthcare Power of Attorney  Pre-existing out of facility DNR order (yellow form or pink MOST form) --  "MOST"  Form in Place? --      Prognosis:  Poor long-term prognosis given widely metastatic cancer  Discharge Planning: To Be Determined  Care plan was discussed with patient,  patient's wife [name]  Total time: 15 minutes Greater than 50% of this time was spent in counseling and coordinating care related to the above assessment and plan.  Dorthy Cooler, PA-C Palliative Medicine Team Team phone # [phone]  Thank you for allowing the Palliative Medicine Team to assist in the care of this patient. Please utilize secure chat with additional questions, if there is no response within 30 minutes please call the above phone number.  Palliative Medicine Team providers are available by phone from 7am to 7pm daily and can be reached through the team cell phone.  Should this patient require assistance outside of these hours, please call the patient's attending physician.

## 2020-11-10 ENCOUNTER — Ambulatory Visit
Admit: 2020-11-10 | Discharge: 2020-11-10 | Disposition: A | Discharge status: Home / Self Care | Payer: Medicare HMO | Attending: Radiation Oncology | Admitting: Radiation Oncology

## 2020-11-10 DIAGNOSIS — C438 Malignant melanoma of overlapping sites of skin: Secondary | ICD-10-CM | POA: Diagnosis not present

## 2020-11-10 DIAGNOSIS — G893 Neoplasm related pain (acute) (chronic): Secondary | ICD-10-CM | POA: Diagnosis not present

## 2020-11-10 DIAGNOSIS — Z7189 Other specified counseling: Secondary | ICD-10-CM | POA: Diagnosis not present

## 2020-11-10 DIAGNOSIS — Z85820 Personal history of malignant melanoma of skin: Secondary | ICD-10-CM | POA: Diagnosis not present

## 2020-11-10 DIAGNOSIS — C801 Malignant (primary) neoplasm, unspecified: Secondary | ICD-10-CM | POA: Diagnosis not present

## 2020-11-10 DIAGNOSIS — C7951 Secondary malignant neoplasm of bone: Secondary | ICD-10-CM

## 2020-11-10 DIAGNOSIS — C7931 Secondary malignant neoplasm of brain: Secondary | ICD-10-CM | POA: Insufficient documentation

## 2020-11-10 DIAGNOSIS — Z515 Encounter for palliative care: Secondary | ICD-10-CM

## 2020-11-10 DIAGNOSIS — C4359 Malignant melanoma of other part of trunk: Secondary | ICD-10-CM

## 2020-11-10 LAB — BASIC METABOLIC PANEL
Anion gap: 6 (ref 5–15)
BUN: 34 mg/dL — ABNORMAL HIGH (ref 8–23)
CO2: 31 mmol/L (ref 22–32)
Calcium: 8.1 mg/dL — ABNORMAL LOW (ref 8.9–10.3)
Chloride: 99 mmol/L (ref 98–111)
Creatinine, Ser: 0.74 mg/dL (ref 0.61–1.24)
GFR, Estimated: >60 mL/min (ref >60)
Glucose, Bld: 135 mg/dL — ABNORMAL HIGH (ref 70–99)
Potassium: 4.7 mmol/L (ref 3.5–5.1)
Sodium: 136 mmol/L (ref 135–145)

## 2020-11-10 LAB — CBC WITH DIFFERENTIAL/PLATELET
Abs Immature Granulocytes: 0.33 10*3/uL — ABNORMAL HIGH (ref 0.00–0.07)
Basophils Absolute: 0 10*3/uL (ref 0.0–0.1)
Basophils Relative: 0 %
Eosinophils Absolute: 0 10*3/uL (ref 0.0–0.5)
Eosinophils Relative: 0 %
HCT: 31.9 % — ABNORMAL LOW (ref 39.0–52.0)
Hemoglobin: 9.7 g/dL — ABNORMAL LOW (ref 13.0–17.0)
Immature Granulocytes: 2 %
Lymphocytes Relative: 6 %
Lymphs Abs: 1.1 10*3/uL (ref 0.7–4.0)
MCH: 25.9 pg — ABNORMAL LOW (ref 26.0–34.0)
MCHC: 30.4 g/dL (ref 30.0–36.0)
MCV: 85.1 fL (ref 80.0–100.0)
Monocytes Absolute: 2 10*3/uL — ABNORMAL HIGH (ref 0.1–1.0)
Monocytes Relative: 11 %
Neutro Abs: 15.5 10*3/uL — ABNORMAL HIGH (ref 1.7–7.7)
Neutrophils Relative %: 81 %
Platelets: 352 10*3/uL (ref 150–400)
RBC: 3.75 MIL/uL — ABNORMAL LOW (ref 4.22–5.81)
RDW: 17.9 % — ABNORMAL HIGH (ref 11.5–15.5)
WBC: 19.1 10*3/uL — ABNORMAL HIGH (ref 4.0–10.5)
nRBC: 0.2 % (ref 0.0–0.2)

## 2020-11-10 NOTE — Progress Notes (Signed)
PROGRESS NOTE    Gary Young  TKW:[id number] DOB: 12-13-1950 DOA: 11/04/2020 PCP: [name], MD  Brief Narrative: Gary Young is a 1/M with h/o melanoma diagnosed 8 years ago and excised, seasonal allergies, recently diagnosed iron deficiency anemia, who was recently found to have metastatic disease on CT scan at outside facility involving his lungs, liver, and bones with unknown primary.  Onset of symptomatology on 10/11/20 when the patient noted that he was becoming significantly weak with minimal exertion, then developed pelvic pain, referred to orthopedic surgery where a CT abdomen and pelvis with contrast was done on 10/24/2020 revealing extensive metastatic disease affecting his left lower lobe lung, a lesion measuring 3.6 x 4.2 cm, bilateral pulmonary nodules, hepatic metastasis, retroperitoneal masses, soft tissue mass along the right psoas muscle, lytic lesions involving the thoracolumbar spine and bony pelvis.  -Then referred to oncology Dr. Marin Olp.  He was prescribed fentanyl patches and tramadol.  His pain has not been controlled.  Despite his pain management at home he is still having pain so severe he can barely move.  He called hematology oncology's office on the day of admission due to his uncontrolled pain.  Dr. Marin Olp recommended admission for pain control  Assessment & Plan:   Widely metastatic cancer Brain metastasis -Previous history of melanoma resected 8 years ago, recent CT from 5/14 revealed extensive metastatic disease affecting his lower lungs, hepatic metastasis, retroperitoneal masses, soft tissue mass along the right psoas muscle, lytic lesions involving the thoracolumbar spine and bony pelvis.   -Clinically much improved symptom wise on morphine PCA, transitioned to oral OxyContin and oxycodone yesterday, symptoms stable, continue laxatives -Dr. Marin Olp following, completed staging CT, Port-A-Cath placement and biopsy of right neck nodule -Pathology still  pending -MRI brain with multiple nonhemorrhagic metastasis, thoracic spinal disease noted as well -Per Dr. Marin Olp , continue IV steroids, radiation oncology consulted -Palliative care team following,  -Continue physical therapy  Intractable diffuse bones and joints pain -Off morphine PCA, stable on OxyContin/oxycodone -Continue MiraLAX and Senokot  Iron deficiency and anemia of chronic disease -Dr. Marin Olp wants GI evaluation to rule out melanoma of the GI tract -Discussed with [facility] gastroenterology, plan for endoscopy tomorrow  Hypovolemic hyponatremia -Improving, monitor  Moderate protein calorie malnutrition -Supplements as tolerated  Iron deficiency anemia, suspect related to metastatic disease, likely anemia of chronic disease. -Given IV iron  Leukocytosis -Suspected to be secondary to advanced cancer and IV steroids, afebrile nontoxic, continue to monitor   DVT prophylaxis: Subcu Lovenox daily Code Status: DNR  Family Communication:  Discussed with wife at bedside  Disposition Plan:  Status is: Inpatient  Remains inpatient appropriate because:Inpatient level of care appropriate due to severity of illness   Dispo:  Patient From: Home  Planned Disposition: Home with Health Care Svc  Medically stable for discharge: No     Consultants:   Oncology, palliative care, IR   Procedures:   Antimicrobials:    Subjective: -Ambulated in the halls using a walker yesterday  Objective: Vitals:   11/09/20 2331 11/10/20 0050 11/10/20 0522 11/10/20 1353  BP: (!) 164/101 (!) 147/97 (!) 143/106 (!) 104/55  Pulse: 72 73 80 81  Resp:   18 16  Temp:   98 F (36.7 C) 97.6 F (36.4 C)  TempSrc:   Oral Oral  SpO2:   96% 97%  Weight:      Height:        Intake/Output Summary (Last 24 hours) at 11/10/2020 1403 Last data filed at 11/10/2020  1100 Gross per 24 hour  Intake 646 ml  Output 800 ml  Net -154 ml   Filed Weights   11/04/20 1525 11/05/20 0136   Weight: 105.7 kg 105.4 kg    Examination:  General exam: Obese chronically ill male laying in bed, AAOx3, no distress HEENT: Right neck nodular mass, positive pallor CVS: S1-S2, regular rate rhythm Lungs: Decreased breath sounds to bases Abdomen: Soft, nontender, bowel sounds present Extremities: No edema Neuro: Mild left lower extremity weakness Skin: No rashes on exposed skin  Data Reviewed:   CBC: Recent Labs  Lab 11/04/20 1547 11/05/20 0512 11/06/20 0557 11/07/20 0431 11/09/20 0754 11/10/20 0619  WBC 12.8* 12.2* 13.7* 16.8* 20.3* 19.1*  NEUTROABS 10.0*  --  10.1* 14.9*  --  15.5*  HGB 9.2* 8.7* 8.8* 8.9* 9.9* 9.7*  HCT 29.9* 28.6* 28.9* 29.1* 33.3* 31.9*  MCV 82.4 83.9 84.5 83.4 85.4 85.1  PLT 409* 365 359 352 342 321   Basic Metabolic Panel: Recent Labs  Lab 11/04/20 1547 11/05/20 0512 11/06/20 0557 11/07/20 0431 11/10/20 0619  NA 131* 135 133* 137 136  K 4.4 4.7 4.4 4.3 4.7  CL 93* 94* 93* 97* 99  CO2 28 34* 29 29 31   GLUCOSE 133* 133* 106* 203* 135*  BUN 21 22 20 23  34*  CREATININE 0.80 0.78 0.57* 0.52* 0.74  CALCIUM 9.2 9.2 9.3 9.3 8.1*  MG  --  2.0  --   --   --   PHOS  --  4.5  --   --   --    GFR: Estimated Creatinine Clearance: 106.3 mL/min (by C-G formula based on SCr of 0.74 mg/dL). Liver Function Tests: Recent Labs  Lab 11/04/20 1547 11/05/20 0512 11/06/20 0557 11/07/20 0431  AST 30 29 26 28   ALT 26 23 23 26   ALKPHOS 109 105 102 105  BILITOT 0.6 0.3 0.7 0.4  PROT 5.9* 5.5* 5.3* 5.8*  ALBUMIN 2.8* 2.7* 2.5* 2.7*   No results for input(s): LIPASE, AMYLASE in the last 168 hours. No results for input(s): AMMONIA in the last 168 hours. Coagulation Profile: No results for input(s): INR, PROTIME in the last 168 hours. Cardiac Enzymes: No results for input(s): CKTOTAL, CKMB, CKMBINDEX, TROPONINI in the last 168 hours. BNP (last 3 results) No results for input(s): PROBNP in the last 8760 hours. HbA1C: No results for input(s): HGBA1C  in the last 72 hours. CBG: No results for input(s): GLUCAP in the last 168 hours. Lipid Profile: No results for input(s): CHOL, HDL, LDLCALC, TRIG, CHOLHDL, LDLDIRECT in the last 72 hours. Thyroid Function Tests: No results for input(s): TSH, T4TOTAL, FREET4, T3FREE, THYROIDAB in the last 72 hours. Anemia Panel: No results for input(s): VITAMINB12, FOLATE, FERRITIN, TIBC, IRON, RETICCTPCT in the last 72 hours. Urine analysis:    Component Value Date/Time   COLORURINE YELLOW 11/04/2020 2200   APPEARANCEUR HAZY (A) 11/04/2020 2200   LABSPEC 1.026 11/04/2020 2200   PHURINE 5.0 11/04/2020 2200   GLUCOSEU NEGATIVE 11/04/2020 2200   HGBUR NEGATIVE 11/04/2020 2200   BILIRUBINUR NEGATIVE 11/04/2020 2200   KETONESUR 5 (A) 11/04/2020 2200   PROTEINUR NEGATIVE 11/04/2020 2200   NITRITE NEGATIVE 11/04/2020 2200   LEUKOCYTESUR NEGATIVE 11/04/2020 2200   Sepsis Labs: @LABRCNTIP (procalcitonin:4,lacticidven:4)  ) Recent Results (from the past 240 hour(s))  Resp Panel by RT-PCR (Flu A&B, Covid) Nasopharyngeal Swab     Status: None   Collection Time: 11/04/20  4:44 PM   Specimen: Nasopharyngeal Swab; Nasopharyngeal(NP) swabs in vial transport  medium  Result Value Ref Range Status   SARS Coronavirus 2 by RT PCR NEGATIVE NEGATIVE Final    Comment: (NOTE) SARS-CoV-2 target nucleic acids are NOT DETECTED.  The SARS-CoV-2 RNA is generally detectable in upper respiratory specimens during the acute phase of infection. The lowest concentration of SARS-CoV-2 viral copies this assay can detect is 138 copies/mL. A negative result does not preclude SARS-Cov-2 infection and should not be used as the sole basis for treatment or other patient management decisions. A negative result may occur with  improper specimen collection/handling, submission of specimen other than nasopharyngeal swab, presence of viral mutation(s) within the areas targeted by this assay, and inadequate number of viral copies(<138  copies/mL). A negative result must be combined with clinical observations, patient history, and epidemiological information. The expected result is Negative.  Fact Sheet for Patients:  EntrepreneurPulse.com.au  Fact Sheet for Healthcare Providers:  IncredibleEmployment.be  This test is no t yet approved or cleared by the [place] FDA and  has been authorized for detection and/or diagnosis of SARS-CoV-2 by FDA under an Emergency Use Authorization (EUA). This EUA will remain  in effect (meaning this test can be used) for the duration of the COVID-19 declaration under Section 564(b)(1) of the Act, 21 U.S.C.section 360bbb-3(b)(1), unless the authorization is terminated  or revoked sooner.       Influenza A by PCR NEGATIVE NEGATIVE Final   Influenza B by PCR NEGATIVE NEGATIVE Final    Comment: (NOTE) The Xpert Xpress SARS-CoV-2/FLU/RSV plus assay is intended as an aid in the diagnosis of influenza from Nasopharyngeal swab specimens and should not be used as a sole basis for treatment. Nasal washings and aspirates are unacceptable for Xpert Xpress SARS-CoV-2/FLU/RSV testing.  Fact Sheet for Patients: EntrepreneurPulse.com.au  Fact Sheet for Healthcare Providers: IncredibleEmployment.be  This test is not yet approved or cleared by the [place] FDA and has been authorized for detection and/or diagnosis of SARS-CoV-2 by FDA under an Emergency Use Authorization (EUA). This EUA will remain in effect (meaning this test can be used) for the duration of the COVID-19 declaration under Section 564(b)(1) of the Act, 21 U.S.C. section 360bbb-3(b)(1), unless the authorization is terminated or revoked.  Performed at [facility], [place] [address], [place], [place] [address]     Radiology Studies: No results found.  Scheduled Meds: . Chlorhexidine Gluconate Cloth  6 each Topical Daily  .  cholecalciferol  2,000 Units Oral Daily  . doxazosin  8 mg Oral QPM  . enoxaparin (LOVENOX) injection  40 mg Subcutaneous Q24H  . feeding supplement  237 mL Oral BID BM  . latanoprost  1 drop Left Eye QHS  . metoprolol tartrate  25 mg Oral BID  . multivitamin with minerals  1 tablet Oral Daily  . oxyCODONE  20 mg Oral Q12H  . pantoprazole  40 mg Oral BID AC  . polyethylene glycol  17 g Oral Daily  . senna-docusate  2 tablet Oral BID  . temazepam  15 mg Oral QHS  . timolol  1 drop Both Eyes Daily  . cyanocobalamin  1,000 mcg Oral Daily   Continuous Infusions: . dexamethasone (DECADRON) IVPB ([facility]) 20 mg (11/10/20 1001)     LOS: 5 days   Time spent: 48min  Domenic Polite, MD [facility] 11/10/2020, 2:03 PM

## 2020-11-10 NOTE — H&P (View-Only) (Signed)
Referring Provider: Dr. Domenic Polite [facility]) Primary Care Physician:  [name], MD Primary Gastroenterologist:  Althia Forts [facility])  Reason for Consultation:  Anemia, heme-positive stool  HPI: Gary Young is a 70 y.o. male with past medical history of metastatic melanoma presenting for consultation of anemia and heme positive stool.  Patient reports some upper abdominal pain today which he states feels like "gas pain." Reports poor appetite but has not recently lost any weight. He lost 10 lbs prior to melanoma diagnosis.  Denies nausea, vomiting, dysphagia.    Denies constipation or diarrhea.  Just hard a darker stool but is on iron. Denies hematochezia.  No ASA or NSAID use.  Lovenox during admission, but no outpatient blood thinners.  No known family history of colon cancer or gastrointestinal malignancy.  Prior EGD/colonoscopy, though he cannot recall where/when.  Past Medical History:  Diagnosis Date  . Asthma   . Cancer [facility])     Past Surgical History:  Procedure Laterality Date  . BACK SURGERY    . EYE SURGERY    . IR IMAGING GUIDED PORT INSERTION  11/05/2020  . IR US GUIDE BX ASP/DRAIN  11/05/2020  . SKIN BIOPSY    . TONSILLECTOMY      Prior to Admission medications   Medication Sig Start Date End Date Taking? Authorizing Provider  albuterol (VENTOLIN HFA) 108 (90 Base) MCG/ACT inhaler Inhale into the lungs. 10/01/20  Yes [provider]  Cholecalciferol (VITAMIN D) 50 MCG (2000 UT) tablet Take 2,000 Units by mouth daily.   Yes [provider]  cyanocobalamin 1000 MCG tablet Take 1,000 mcg by mouth daily.   Yes [provider]  cyclobenzaprine (FLEXERIL) 10 MG tablet Take 1 tablet by mouth 3 (three) times daily as needed. 10/21/20  Yes [provider]  doxazosin (CARDURA) 8 MG tablet Take 8 mg by mouth daily. 02/06/20  Yes [provider]  fentaNYL (DURAGESIC) 25 MCG/HR Place 1 patch onto the skin every 3 (three)  days. 10/27/20  Yes Volanda Napoleon, MD  ferrous sulfate 325 (65 FE) MG tablet Take 325 mg by mouth daily.   Yes [provider]  hydrOXYzine (ATARAX/VISTARIL) 25 MG tablet Take 1 tablet (25 mg total) by mouth 3 (three) times daily as needed. Patient taking differently: Take 25 mg by mouth 3 (three) times daily as needed for itching. 10/27/20  Yes Ennever, Rudell Cobb, MD  latanoprost (XALATAN) 0.005 % ophthalmic solution Place 1 drop into the left eye at bedtime. 10/01/20  Yes [provider]  timolol (TIMOPTIC) 0.5 % ophthalmic solution Place 1 drop into both eyes daily. 01/29/20  Yes [provider]  traMADol (ULTRAM) 50 MG tablet Take 50-100 mg by mouth 3 (three) times daily as needed for moderate pain. 10/26/20  Yes [provider]    Scheduled Meds: . Chlorhexidine Gluconate Cloth  6 each Topical Daily  . cholecalciferol  2,000 Units Oral Daily  . doxazosin  8 mg Oral QPM  . enoxaparin (LOVENOX) injection  40 mg Subcutaneous Q24H  . feeding supplement  237 mL Oral BID BM  . latanoprost  1 drop Left Eye QHS  . metoprolol tartrate  25 mg Oral BID  . multivitamin with minerals  1 tablet Oral Daily  . oxyCODONE  20 mg Oral Q12H  . pantoprazole  40 mg Oral BID AC  . polyethylene glycol  17 g Oral Daily  . senna-docusate  2 tablet Oral BID  . temazepam  15 mg Oral  QHS  . timolol  1 drop Both Eyes Daily  . cyanocobalamin  1,000 mcg Oral Daily   Continuous Infusions: . dexamethasone (DECADRON) IVPB ([facility]) 20 mg (11/10/20 1001)   PRN Meds:.acetaminophen, hydrOXYzine, labetalol, metoprolol tartrate, oxyCODONE, simethicone  Allergies as of 11/04/2020  . (No Known Allergies)    History reviewed. No pertinent family history.  Social History   Socioeconomic History  . Marital status: Married    Spouse name: Not on file  . Number of children: Not on file  . Years of education: Not on file  . Highest education level: Not on file  Occupational History  .  Not on file  Tobacco Use  . Smoking status: Never Smoker  . Smokeless tobacco: Never Used  Vaping Use  . Vaping Use: Never used  Substance and Sexual Activity  . Alcohol use: Yes    Alcohol/week: 2.0 standard drinks    Types: 1 Glasses of wine, 1 Cans of beer per week    Comment: Socially  . Drug use: Never  . Sexual activity: Not on file  Other Topics Concern  . Not on file  Social History Narrative  . Not on file   Social Determinants of Health   Financial Resource Strain: Not on file  Food Insecurity: Not on file  Transportation Needs: Not on file  Physical Activity: Not on file  Stress: Not on file  Social Connections: Not on file  Intimate Partner Violence: Not on file    Review of Systems: Review of Systems  Constitutional: Positive for malaise/fatigue. Negative for chills and fever.  HENT: Negative for sore throat.   Eyes: Negative for pain and redness.  Respiratory: Negative for cough, shortness of breath and stridor.   Cardiovascular: Negative for chest pain and palpitations.  Gastrointestinal: Positive for abdominal pain. Negative for blood in stool, constipation, diarrhea, heartburn, melena, nausea and vomiting.  Genitourinary: Negative for flank pain and hematuria.  Musculoskeletal: Positive for joint pain. Negative for falls.  Skin: Negative for itching and rash.  Neurological: Negative for seizures and loss of consciousness.  Endo/Heme/Allergies: Negative for polydipsia. Does not bruise/bleed easily.  Psychiatric/Behavioral: Negative for substance abuse. The patient is not nervous/anxious.     Physical Exam: Physical Exam Constitutional:      General: He is not in acute distress. HENT:     Head: Normocephalic and atraumatic.     Nose: Nose normal. No congestion.     Mouth/Throat:     Mouth: Mucous membranes are moist.     Pharynx: Oropharynx is clear.  Eyes:     General: No scleral icterus.    Extraocular Movements: Extraocular movements intact.      Conjunctiva/sclera: Conjunctivae normal.  Cardiovascular:     Rate and Rhythm: Normal rate and regular rhythm.  Pulmonary:     Effort: Pulmonary effort is normal. No respiratory distress.  Abdominal:     General: Bowel sounds are normal. There is no distension.     Palpations: Abdomen is soft. There is no mass.     Tenderness: There is abdominal tenderness (moderate epigastric). There is guarding (voluntary). There is no rebound.     Hernia: No hernia is present.  Musculoskeletal:        General: No swelling or tenderness.     Cervical back: Normal range of motion and neck supple.  Skin:    General: Skin is warm and dry.  Neurological:     General: No focal deficit present.     Mental  Status: He is alert and oriented to person, place, and time.  Psychiatric:        Mood and Affect: Mood normal.        Behavior: Behavior normal. Behavior is cooperative.     Vital signs: Vitals:   11/10/20 0050 11/10/20 0522  BP: (!) 147/97 (!) 143/106  Pulse: 73 80  Resp:  18  Temp:  98 F (36.7 C)  SpO2:  96%   Last BM Date: 11/10/20   GI:  Lab Results: Recent Labs    11/09/20 0754 11/10/20 0619  WBC 20.3* 19.1*  HGB 9.9* 9.7*  HCT 33.3* 31.9*  PLT 342 352   BMET Recent Labs    11/10/20 0619  NA 136  K 4.7  CL 99  CO2 31  GLUCOSE 135*  BUN 34*  CREATININE 0.74  CALCIUM 8.1*   LFT No results for input(s): PROT, ALBUMIN, AST, ALT, ALKPHOS, BILITOT, BILIDIR, IBILI in the last 72 hours. PT/INR No results for input(s): LABPROT, INR in the last 72 hours.   Studies/Results: No results found.  Impression: Anemia, heme-positive stool. 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  No abnormal findings of stomach/bowel on recent CT. 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 Marin Olp recommends endoscopy to evaluate for melanoma in GI tract, which could impact systemic therapy.  Thus, we will proceed with EGD tomorrow.  Pending findings, consider colonoscopy thereafter.  I thoroughly discussed the procedure with the patient to include nature, alternatives, benefits, and risks (including but not limited to bleeding, infection, perforation, anesthesia/cardiac and pulmonary complications). Patient verbalized understanding and gave verbal consent to proceed with EGD.  Continue Protonix BID.  Soft diet, NPO at midnight.  [facility] will follow.   LOS: 5 days   Salley Slaughter  PA-C 11/10/2020, 11:35 AM  Contact #  [phone]

## 2020-11-10 NOTE — Progress Notes (Signed)
Gary Young is now off the PCA.  He is on an oral regimen.  He seems to be pretty comfortable.  He was out of bed sitting in a chair yesterday.  I think he did a little physical therapy.  We have to get radiation therapy started.  I will call them this morning.  They will simulate him today I am sure.  We still need to await the pathology results from his biopsy.  Again, I just have no doubt that we are looking at melanoma that has recurred.  Everything about his disease says melanoma.  He has had no nausea or vomiting.  There has been no issues with diarrhea.  He has had no bleeding.  I think his stool is heme positive.  Given the fact, he is going to need to have a gastroenterology evaluation and endoscopy.  We need to see if there is any melanoma lesions in his GI tract.  I think this is important for [place] to know.  His labs are still pending for today.  He is on steroids.  This is for his CNS metastasis and to some degree his bone metastasis.  His vital signs show temperature of 98.  Pulse 80.  Blood pressure 143/106.  His lungs are clear.  Oral exam shows no thrush.  Cardiac exam regular rate and rhythm.  Abdomen is soft.  Bowel sounds are present.  There is no guarding or rebound tenderness.  Extremities shows no clubbing, cyanosis or edema.  He moves his extremities okay.  Neurological exam shows no focal neurological deficits.  He needs a start radiation therapy for the brain mets.  He needs to have gastroenterology see him for the heme positive stool and the need for endoscopy.  Again endoscopy to evaluate for any melanoma lesions will help dictate his systemic therapy.  I do appreciate the wonderful care that he is getting from all the staff up on 6 [address].  Lattie Haw, MD  [place] 14:14

## 2020-11-10 NOTE — Anesthesia Preprocedure Evaluation (Addendum)
Anesthesia Evaluation  Patient identified by MRN, date of birth, ID band Patient awake    Reviewed: Allergy & Precautions, NPO status , Patient's Chart, lab work & pertinent test results  History of Anesthesia Complications Negative for: history of anesthetic complications  Airway Mallampati: I  TM Distance: >3 FB Neck ROM: Full    Dental  (+) Edentulous Upper, Edentulous Lower, Dental Advisory Given   Pulmonary asthma ,    Pulmonary exam normal        Cardiovascular negative cardio ROS Normal cardiovascular exam     Neuro/Psych negative neurological ROS     GI/Hepatic Neg liver ROS,   Endo/Other  negative endocrine ROS  Renal/GU negative Renal ROS     Musculoskeletal negative musculoskeletal ROS (+)   Abdominal   Peds  Hematology negative hematology ROS (+) anemia ,   Anesthesia Other Findings Malignant melanoma ([place]) Palliative care  Liver masses Brain metastases (HCC) Bone metastasis (HCC)    Reproductive/Obstetrics                            Anesthesia Physical Anesthesia Plan  ASA: III  Anesthesia Plan: MAC   Post-op Pain Management:    Induction:   PONV Risk Score and Plan: Ondansetron and Propofol infusion  Airway Management Planned: Natural Airway and Simple Face Mask  Additional Equipment:   Intra-op Plan:   Post-operative Plan:   Informed Consent: I have reviewed the patients History and Physical, chart, labs and discussed the procedure including the risks, benefits and alternatives for the proposed anesthesia with the patient or authorized representative who has indicated his/her understanding and acceptance.     Dental advisory given  Plan Discussed with: Anesthesiologist and CRNA  Anesthesia Plan Comments:       Anesthesia Quick Evaluation

## 2020-11-10 NOTE — Plan of Care (Signed)
Pt A&Ox4, pt c/o back pain PRN pain meds given, K-Pad used for sitting up in chair. Pt compliant with treatment and medications. Pt performed ADL with assistance from Wife and RN. No other needs voiced at this time. Bed in lowest position, call bell nearby, walker nearby.  BP (!) 143/106   Pulse 80   Temp 98 F (36.7 C) (Oral)   Resp 18   Ht 6' (1.829 m)   Wt 105.4 kg   SpO2 96%   BMI 31.51 kg/m

## 2020-11-10 NOTE — Progress Notes (Signed)
Radiation Oncology         [phone] ________________________________  Initial inpatient Consultation  Name: Gary Young MRN: [id number]  Date of Service: 11/04/2020 DOB: Nov 07, 1950  CC:Barbie Banner, MD  No ref. provider found   REFERRING PHYSICIAN: Arlan Organ, MD  DIAGNOSIS: 70 yo man with painful right hip metastasis and brain metastases with history of melanoma - Stage IV    ICD-10-CM   1. Pain from bone metastases (HCC)  G89.3 ferumoxytol (FERAHEME) 510 mg in sodium chloride 0.9 % 100 mL IVPB   C79.51 zolendronic acid (ZOMETA) 4 mg in sodium chloride 0.9 % 100 mL IVPB    DISCONTINUED: dexamethasone (DECADRON) injection 40 mg  2. Nodule of neck  R22.1 US SOFT TISSUE HEAD & NECK (NON-THYROID)    US SOFT TISSUE HEAD & NECK (NON-THYROID)  3. Intractable pain  R52   4. Mass in neck  R22.1 ferumoxytol (FERAHEME) 510 mg in sodium chloride 0.9 % 100 mL IVPB    IR US Guide Bx Asp/Drain    IR US Guide Bx Asp/Drain    CANCELED: [place] CORE BIOPSY (SOFT TISSUE)    CANCELED: [place] CORE BIOPSY (SOFT TISSUE)  5. Melanoma (HCC)  C43.9 CT CHEST ABDOMEN PELVIS W CONTRAST    CT CHEST ABDOMEN PELVIS W CONTRAST  6. Liver masses  R16.0 MR Brain W Wo Contrast    MR Brain W Wo Contrast    DISCONTINUED: dexamethasone (DECADRON) injection 20 mg  7. Malignant melanoma of overlapping sites (HCC)  C43.8 pantoprazole (PROTONIX) EC tablet 40 mg    temazepam (RESTORIL) capsule 15 mg    oxyCODONE (OXYCONTIN) 12 hr tablet 20 mg    oxyCODONE (Oxy IR/ROXICODONE) immediate release tablet 10 mg    DISCONTINUED: dexamethasone (DECADRON) injection 20 mg    HISTORY OF PRESENT ILLNESS: Gary Young is a 70 y.o. male seen at the request of Dr. Myna Hidalgo.  He had excision of an ulcerated 7.6 mm melanoma from the left flank at [facility] with Deveron Furlong on 07/23/12.  Subsequent sentinel lymph node sampling yielded 3 negative nodes from the left axilla and left groin    Recently, he began to have some pain mostly  in his right pelvic area.  He had a CT abdomen pelvis on 10/24/2020.  This scan showed extensive metastatic disease.  He had a left lower lobe lung lesion measuring 3.6 x 4.2 cm.  He had bilateral pulmonary nodules.  Had innumerable by lobar hepatic metastasis.  The largest was in the right hepatic lobe measuring 4.9 x 5.8 cm.  A second lesion measured 3.9 x 4.4 cm.  He had retroperitoneal masses.  He had soft tissue mass along the right psoas muscle.  There was some permeative changes in the right ilium.  There are lytic lesions involving the thoracolumbar spine and bony pelvis.  He met Dr. Myna Hidalgo on 10/27/20.  He was set up for brain MRI, PET and biopsy, but, due to increasing pain, the patient required hospital admission.    CT Chest abdomen pelvis on 5/26 showed numerous metastases, and with regard to his right hip pain the following findings were pertinent: large lesion involving the right iliopsoas muscle and right iliac bone measures 7.3 x 6.5 cm, image 98/2. Associated permeative changes are noted involving the iliac bone.  -Lytic lesion involving the posterior right iliac bone measures 3.8 x 4.7 cm, image 97/2.      Brain MRI on 5/27 showed at least 8 large hemorrhagic brain metastases.  The patient has been referred to discuss palliative radiotherapy to the right hip and brain.  PREVIOUS RADIATION THERAPY: No  PAST MEDICAL HISTORY:  Past Medical History:  Diagnosis Date  . Asthma   . Cancer [facility])       PAST SURGICAL HISTORY: Past Surgical History:  Procedure Laterality Date  . BACK SURGERY    . EYE SURGERY    . IR IMAGING GUIDED PORT INSERTION  11/05/2020  . IR US GUIDE BX ASP/DRAIN  11/05/2020  . SKIN BIOPSY    . TONSILLECTOMY      FAMILY HISTORY: History reviewed. No pertinent family history.  SOCIAL HISTORY:  Social History   Socioeconomic History  . Marital status: Married    Spouse name: Not on file  . Number of children: Not on file  . Years of  education: Not on file  . Highest education level: Not on file  Occupational History  . Not on file  Tobacco Use  . Smoking status: Never Smoker  . Smokeless tobacco: Never Used  Vaping Use  . Vaping Use: Never used  Substance and Sexual Activity  . Alcohol use: Yes    Alcohol/week: 2.0 standard drinks    Types: 1 Glasses of wine, 1 Cans of beer per week    Comment: Socially  . Drug use: Never  . Sexual activity: Not on file  Other Topics Concern  . Not on file  Social History Narrative  . Not on file   Social Determinants of Health   Financial Resource Strain: Not on file  Food Insecurity: Not on file  Transportation Needs: Not on file  Physical Activity: Not on file  Stress: Not on file  Social Connections: Not on file  Intimate Partner Violence: Not on file    ALLERGIES: Patient has no known allergies.  MEDICATIONS:  Current Facility-Administered Medications  Medication Dose Route Frequency Provider Last Rate Last Admin  . acetaminophen (TYLENOL) tablet 650 mg  650 mg Oral Q6H PRN Irene Pap N, DO      . Chlorhexidine Gluconate Cloth 2 % PADS 6 each  6 each Topical Daily Volanda Napoleon, MD   6 each at 11/09/20 1000  . cholecalciferol (VITAMIN D) tablet 2,000 Units  2,000 Units Oral Daily Kayleen Memos, DO   2,000 Units at 11/09/20 6759  . dexamethasone (DECADRON) 20 mg in sodium chloride 0.9 % 50 mL IVPB  20 mg Intravenous Q24H Minda Ditto, RPH 208 mL/hr at 11/09/20 0745 20 mg at 11/09/20 0745  . doxazosin (CARDURA) tablet 8 mg  8 mg Oral QPM Hall, Carole N, DO   8 mg at 11/09/20 1740  . enoxaparin (LOVENOX) injection 40 mg  40 mg Subcutaneous Q24H Gilford Silvius, MD   40 mg at 11/09/20 0943  . feeding supplement (ENSURE ENLIVE / ENSURE PLUS) liquid 237 mL  237 mL Oral BID BM Domenic Polite, MD   237 mL at 11/07/20 1036  . hydrOXYzine (ATARAX/VISTARIL) tablet 25 mg  25 mg Oral TID PRN Irene Pap N, DO   25 mg at 11/07/20 2113  . labetalol (NORMODYNE)  injection 20 mg  20 mg Intravenous Q3H PRN Mansy, Jan A, MD   20 mg at 11/09/20 2340  . latanoprost (XALATAN) 0.005 % ophthalmic solution 1 drop  1 drop Left Eye QHS Hall, Excel N, DO   1 drop at 11/09/20 2047  . metoprolol tartrate (LOPRESSOR) injection 2.5 mg  2.5 mg Intravenous Q6H PRN Kayleen Memos, DO  2.5 mg at 11/09/20 2242  . metoprolol tartrate (LOPRESSOR) tablet 25 mg  25 mg Oral BID Domenic Polite, MD   25 mg at 11/09/20 2043  . multivitamin with minerals tablet 1 tablet  1 tablet Oral Daily Domenic Polite, MD   1 tablet at 11/09/20 [phone]  . oxyCODONE (Oxy IR/ROXICODONE) immediate release tablet 10 mg  10 mg Oral Q3H PRN Volanda Napoleon, MD   10 mg at 11/10/20 0617  . oxyCODONE (OXYCONTIN) 12 hr tablet 20 mg  20 mg Oral Q12H Volanda Napoleon, MD   20 mg at 11/09/20 2044  . pantoprazole (PROTONIX) EC tablet 40 mg  40 mg Oral BID AC Volanda Napoleon, MD   40 mg at 11/09/20 1739  . polyethylene glycol (MIRALAX / GLYCOLAX) packet 17 g  17 g Oral Daily Domenic Polite, MD   17 g at 11/07/20 1034  . senna-docusate (Senokot-S) tablet 2 tablet  2 tablet Oral BID Kayleen Memos, DO   2 tablet at 11/09/20 2046  . simethicone (MYLICON) chewable tablet 80 mg  80 mg Oral Q6H PRN Domenic Polite, MD   80 mg at 11/10/20 0347  . temazepam (RESTORIL) capsule 15 mg  15 mg Oral QHS Volanda Napoleon, MD   15 mg at 11/09/20 2044  . timolol (TIMOPTIC) 0.5 % ophthalmic solution 1 drop  1 drop Both Eyes Daily [place], Archie Patten N, DO   1 drop at 11/09/20 0951  . vitamin B-12 (CYANOCOBALAMIN) tablet 1,000 mcg  1,000 mcg Oral Daily Irene Pap N, DO   1,000 mcg at 11/09/20 4259    REVIEW OF SYSTEMS:   A complete review of systems is obtained and is otherwise negative.    PHYSICAL EXAM:  Wt Readings from Last 3 Encounters:  11/05/20 232 lb 5.8 oz (105.4 kg)  10/27/20 232 lb (105.2 kg)  10/13/20 244 lb (110.7 kg)   Temp Readings from Last 3 Encounters:  11/10/20 98 F (36.7 C) (Oral)  10/27/20 98 F (36.7  C) (Oral)  10/13/20 98.4 F (36.9 C) (Oral)   BP Readings from Last 3 Encounters:  11/10/20 (!) 143/106  10/27/20 114/64  10/13/20 107/80   Pulse Readings from Last 3 Encounters:  11/10/20 80  10/27/20 99  10/13/20 (!) 110   Pain Assessment Pain Score: Asleep/10  In general this is a well appearing gentleman.  Per the hospitalist admission physical:  General: 70 y.o. year-old male well developed well nourished in no acute distress.  Alert and oriented x3.  Cardiovascular: Regular rate and rhythm with no rubs or gallops.  No thyromegaly or JVD noted.  No lower extremity edema. 2/4 pulses in all 4 extremities.  Respiratory: Clear to auscultation with no wheezes or rales. Good inspiratory effort.  Abdomen: Soft nontender nondistended with normal bowel sounds x4 quadrants.  Muskuloskeletal: No cyanosis, clubbing or edema noted bilaterally  Neuro: CN II-XII intact, strength, sensation, reflexes  Skin: No ulcerative lesions noted or rashes  Psychiatry: Judgement and insight appear normal. Mood is appropriate for condition and setting   KPS = 30  100 - Normal; no complaints; no evidence of disease. 90   - Able to carry on normal activity; minor signs or symptoms of disease. 80   - Normal activity with effort; some signs or symptoms of disease. 89   - Cares for self; unable to carry on normal activity or to do active work. 60   - Requires occasional assistance, but is able to care for most  of his personal needs. 50   - Requires considerable assistance and frequent medical care. 71   - Disabled; requires special care and assistance. 67   - Severely disabled; hospital admission is indicated although death not imminent. 58   - Very sick; hospital admission necessary; active supportive treatment necessary. 10   - Moribund; fatal processes progressing rapidly. 0     - Dead  Karnofsky DA, Abelmann [place], Craver LS and Burchenal JH [phone]) The use of the nitrogen mustards in the  palliative treatment of carcinoma: with particular reference to bronchogenic carcinoma Cancer 1 [id number]  LABORATORY DATA:  Lab Results  Component Value Date   WBC 19.1 (H) 11/10/2020   HGB 9.7 (L) 11/10/2020   HCT 31.9 (L) 11/10/2020   MCV 85.1 11/10/2020   PLT 352 11/10/2020   Lab Results  Component Value Date   NA 136 11/10/2020   K 4.7 11/10/2020   CL 99 11/10/2020   CO2 31 11/10/2020   Lab Results  Component Value Date   ALT 26 11/07/2020   AST 28 11/07/2020   ALKPHOS 105 11/07/2020   BILITOT 0.4 11/07/2020     RADIOGRAPHY: MR Brain W Wo Contrast  Result Date: 11/06/2020 CLINICAL DATA:  Recurrent melanoma. EXAM: MRI HEAD WITHOUT AND WITH CONTRAST TECHNIQUE: Multiplanar, multiecho pulse sequences of the brain and surrounding structures were obtained without and with intravenous contrast. CONTRAST:  32mL GADAVIST GADOBUTROL 1 MMOL/ML IV SOLN COMPARISON:  None. FINDINGS: Brain: Multiple hemorrhagic metastases are present. Anterior right frontal lobe lesion measures 3.2 x 2.5 x 2.9 cm. An adjacent nonhemorrhagic lesion measures 11 mm. An additional hemorrhagic lesion more superior and posterior to the first lesion described measures 3.4 x 2.9 x 2.4 cm. There is extensive edema surrounding the constellation of lesions. Two nonhemorrhagic lesions within the right corona radiata measure 4 and 6 mm respectively. Hemorrhagic lesion involves the posterior left lateral ventricle, likely centered on the ependyma. Adjacent left occipital lesion measures 12 mm. A more posterior hemorrhagic occipital lesion measures 6 mm. Hemorrhagic lesion in the left caudate head measures 6 mm. A hemorrhagic lesion in the lateral left temporal lobe measures 16 x 13 x 17 mm. No lesions are present in the posterior fossa. Vasogenic edema is most prominent in the right frontal lobe with effacement the sulci. There is edema in the posterior left lateral ventricle and left occipital lesions. There is also surrounding  vasogenic edema of the left temporal lobe lesion. Mild generalized atrophy is present. There is some T2 signal associated with the lesions of the right corona radiata. Internal auditory canals are within normal limits bilaterally. Vascular: Flow is present in the major intracranial arteries. Skull and upper cervical spine: The craniocervical junction is normal. Upper cervical spine is within normal limits. Marrow signal is unremarkable. Sinuses/Orbits: Small mastoid effusions are present bilaterally without enhancement. No obstructing nasopharyngeal lesion is present. The paranasal sinuses and mastoid air cells are otherwise clear. A right lens replacement is present. Globes and orbits are otherwise within normal limits. IMPRESSION: 1. Multiple bilateral brain metastases. Hemorrhagic lesions are present in the anterior right frontal lobe, left parietoccipital lobe, and left temporal lobe with surrounding vasogenic edema is described. None hemorrhagic lesions are present in the right frontal lobe and right corona radiata. 2. Mild generalized atrophy. 3. Small mastoid effusions bilaterally without enhancement. No obstructing nasopharyngeal lesion is present. Electronically Signed   By: San Morelle M.D.   On: 11/06/2020 16:19   MR THORACIC SPINE W WO  CONTRAST  Result Date: 11/06/2020 CLINICAL DATA:  Metastatic disease. EXAM: MRI THORACIC WITHOUT AND WITH CONTRAST TECHNIQUE: Multiplanar and multiecho pulse sequences of the thoracic spine were obtained without and with intravenous contrast. CONTRAST:  25mL GADAVIST GADOBUTROL 1 MMOL/ML IV SOLN COMPARISON:  None. FINDINGS: Alignment:  Normal. Vertebrae: There is a large enhancing lesion in the T11 vertebral body which involves the majority of the vertebral body. This lesion has intrinsic T1 hyperintensity. There is left greater than right ventral epidural extension of tumor which results in moderate left eccentric canal stenosis with flattening of the left  ventral cord. Additionally, there is extension of tumor to involve the left posterior elements, including the left pedicle. There are additional metastatic lesions at multiple thoracic levels, including multiple lesions at the T7 (largest lesion measuring 1.3 cm) mild, T9 (largest lesion measuring 1.1 cm), T12 (largest lesion measuring 1.1 cm) and L1 (partially imaged) levels. Additional small lesion at T10 and suspected multiple small lesions at T5. Cord:  Normal cord signal. Paraspinal and other soft tissues: Suspected slight extension of tumor at the T11 level into the adjacent left paraspinal soft tissues. Please see CT chest abdomen pelvis from May Nov 05, 2020 for characterization of additional extra-spinal findings. Disc levels: Stenosis at the T11 and T11-T12 level due to osseous metastatic disease is detailed above. Mild bilateral foraminal stenosis at T10-T11 due to degenerative change. Otherwise, no evidence of significant canal stenosis or foraminal stenosis. IMPRESSION: Multifocal osseous metastases throughout the thoracic spine, as detailed above. At T11 the metastasis replaces the majority of the vertebral body and extends into the posterior elements and the ventral epidural space with resulting moderate canal stenosis, flattening of the left ventral cord and mild left foraminal stenosis. Electronically Signed   By: Margaretha Sheffield MD   On: 11/06/2020 17:53   NM Bone Scan Whole Body  Result Date: 11/05/2020 CLINICAL DATA:  Metastatic disease of unknown primary, past history melanoma; groin pain EXAM: NUCLEAR MEDICINE WHOLE BODY BONE SCAN TECHNIQUE: Whole body anterior and posterior images were obtained approximately 3 hours after intravenous injection of radiopharmaceutical. RADIOPHARMACEUTICALS:  21.8 mCi Technetium-12m MDP IV COMPARISON:  None Correlation: CT chest abdomen pelvis 11/05/2020 FINDINGS: Uptake in lower thoracic spine at approximately T11, corresponding to lytic lesion on CT.  Uptake seen at the RIGHT sacrum, anterior RIGHT iliac bone, less in LEFT iliac, corresponding to metastatic lesions on CT. Focus of abnormal uptake at the distal RIGHT femoral diaphysis concerning for subtle metastasis recommend radiographic correlation. Nonspecific tracer uptake at the intertrochanteric region of LEFT femur without definite osseous lesion by CT. Uptake at medial and mid RIGHT clavicle corresponding to metastases. No additional worrisome sites of tracer accumulation. Expected urinary tract and soft tissue distribution of tracer. IMPRESSION: Multiple sites of abnormal tracer uptake consistent with osseous metastases, predominantly corresponding with CT abnormalities. Additional sites of uptake are seen at the distal RIGHT femoral diaphysis and at the intertrochanteric region of the LEFT femur concerning for metastases; recommend radiographic correlation of the RIGHT femur to exclude lesion at risk of pathologic fracture. Electronically Signed   By: Lavonia Dana M.D.   On: 11/05/2020 13:52   US SOFT TISSUE HEAD & NECK (NON-THYROID)  Result Date: 11/04/2020 CLINICAL DATA:  History of metastatic disease, now with palpable abnormality involving the right neck. EXAM: ULTRASOUND OF HEAD/NECK SOFT TISSUES TECHNIQUE: Ultrasound examination of the head and neck soft tissues was performed in the area of clinical concern. COMPARISON:  CT abdomen and pelvis-11/03/2020 FINDINGS: Sonographic  evaluation of the patient's palpable area of concern involving the superior aspect of the right-side of the neck correlates with an approximately 1.8 x 1.7 x 1.3 cm mixed echogenic hypoechoic subcutaneous nodule. This nodule appears to abut the dermal surface. Otherwise, there is no sonographic correlate for patient's palpable area of concern. Specifically, no regional cervical lymphadenopathy. IMPRESSION: Patient's palpable area of concern involving the superior aspect of the right-side of the neck correlates with an  approximately 1.8 cm mixed echogenic subdermal subcutaneous nodule which given history of metastatic malignancy may represent either a subcutaneous metastasis (favored given presence of subcutaneous metastasis on abdominal CT performed 11/03/2020) versus a malignant appearing lymph node. Further evaluation with PET-CT imaging could be performed as indicated. Electronically Signed   By: Sandi Mariscal M.D.   On: 11/04/2020 16:24   CT CHEST ABDOMEN PELVIS W CONTRAST  Result Date: 11/05/2020 CLINICAL DATA:  Metastatic malignancy of unknown primary. History of melanoma six years ago. EXAM: CT CHEST, ABDOMEN, AND PELVIS WITH CONTRAST TECHNIQUE: Multidetector CT imaging of the chest, abdomen and pelvis was performed following the standard protocol during bolus administration of intravenous contrast. CONTRAST:  167mL OMNIPAQUE IOHEXOL 300 MG/ML  SOLN COMPARISON:  Outside CT from 10/24/2020 FINDINGS: CT CHEST FINDINGS Cardiovascular: Normal heart size. No pericardial effusion. Aortic atherosclerosis and coronary artery calcifications. Mediastinum/Nodes: No discrete thyroid nodule. No enlarged axillary or supraclavicular lymph nodes. No mediastinal or hilar adenopathy. Lungs/Pleura: Trace left pleural effusion may be partially loculated. Multifocal pulmonary nodules are identified. The majority of these nodules are tiny, less than 4 mm, and too numerous to count. The larger nodules include: -large paravertebral mass within the superior segment of left lower lobe measuring 4.5 by 4.2 by 7.0 cm, image 88/6. -posterior right lower lobe nodule measures 0.9 cm, image 94/6. -posterolateral left upper lobe nodule measures 0.8 cm, image 75/6. -Posterior right upper lobe lung nodule measures 1.1 cm, image 59/6. Musculoskeletal: Multifocal lytic bone metastases are identified. -Expansile lytic lesion involving the head of the right clavicle has associated pathologic fracture. This measures approximately 3.6 x 3.7 cm, image 7/2.  -Lytic lesion with large soft tissue component involves the right-side of the sternal manubrium measuring 3.8 x 3.8 cm, image 15/2. -Large permeative lesion involving greater than 50% of the T11 vertebral body is noted measuring at least 3.8 x 3.7 by 2.3 cm. I suspect there is a pathologic fracture involving this vertebra which is vertically oriented extending from the superior to inferior endplate. There is also cortical destruction along the left side of the vertebral body as well as the posterior cortex, image 136/4 and image 139/5. -Smaller lucent lesion is noted within the smaller lucent lesion within T7 measures 1.3 cm, image 35/2. CT ABDOMEN PELVIS FINDINGS Hepatobiliary: Multiple liver metastases are identified. Index lesion within the dome measures 4.1 x 4.6 cm, image 47/2. Index lesion within inferior right hepatic lobe, segment 6, measures 5.8 x 5.7 cm, image 69/2. Index lesion within lateral segment of left hepatic lobe measures 2.2 x 2.0 cm, image 53/2. Multiple small stones noted within the gallbladder. No gallbladder wall thickening or inflammation. Pancreas: Previously normal appearing body and tail of pancreas now appears edematous with peripancreatic soft tissue stranding, image 63/2. Within the head of pancreas there is a focal area of relative hypoenhancement which measures 2.2 by 3.0 x 2.6 cm, image 75/2 and image 82/4. Similar to the body and tail this area has mild surrounding haziness. No main duct dilatation identified. Spleen: Normal in size without  focal abnormality. Adrenals/Urinary Tract: Left adrenal gland appears normal. Nodule in the right adrenal gland measures 3.2 by 1.8 cm, image 61/2. Indeterminate. Cyst arising off the posterior cortex of the upper pole of right kidney measures 5.5 cm, image 66/2. There is an adjacent, mildly hyperdense lesion 3.7 by 2.7 cm, image 71/2. This measures 25 Hounsfield units and therefore does not meet criteria for simple cysts. Similarly, there is a  small exophytic lesion arising off the posterolateral cortex of the inferior pole of right kidney measuring 1.3 cm and 30 Hounsfield units, image 84/2. Tiny exophytic lesion arising off the posterior cortex of the interpolar left kidney is too small to characterize measuring 6 mm, image 79/2. Urinary bladder is unremarkable. Stomach/Bowel: Stomach is nondistended. No dilated loops of large or small bowel. No bowel wall thickening or inflammation. No obstructing colon mass identified. Vascular/Lymphatic: Aortic atherosclerosis without aneurysm. Enlarged mesenteric lymph nodes are identified including: -central mesenteric node measures 1.5 cm, image 77/2. -also within the central mesentery is a 1.7 cm lymph node, image 81/2. Reproductive: Mild prostate gland enlargement. Other: No ascites. 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 -Index nodule in the left abdomen along the undersurface of the abdominal wall measures 2.1 cm, image 76/2. -Index nodule in the right abdomen anterior to the ascending colon measures 1.6 cm, image 76/2. Multiple retroperitoneal lesions are also identified. Paravertebral mass posterior to the right psoas muscle measures 7.3 x 4.5 cm, image 90/2. -left lower quadrant pericolic gutter nodule measures 1.2 cm, image 97/2 Within the left abdomen lateral to the interpolar left kidney is a nodule measuring 3.5 cm, image 84/2. Musculoskeletal: Multifocal lytic bone metastases are identified. -large lesion involving the right iliopsoas muscle and right iliac bone measures 7.3 x 6.5 cm, image 98/2. Associated permeative changes are noted involving the iliac bone. -Lytic lesion involving the posterior right iliac bone measures 3.8 x 4.7 cm, image 97/2. -lytic lesion involving the left iliac wing measures 3.8 x 2.1 cm. -A few scattered subcutaneous lesions are also identified, including: -soft tissue nodule within the right flank measuring 1.1 cm, image 89/2. -Right  gluteal nodule measuring 0.9 cm, image 95/2. -ventral abdominal wall nodule measures 7 mm, image 103/3. IMPRESSION: 1. Multiple pulmonary metastases. Most of these are tiny and too numerous to count. There is a dominant lesion involving the left lower lobe with long axis diameter of 7 cm. 2. Multifocal lytic bone metastases. This includes a large permeative lesion involving the T11 vertebral body which arose through the left lateral wall and posterior wall of the vertebral body. If there are clinical concerns for canal involvement consider further evaluation with contrast enhanced MRI of the thoracic spine. 3. Multifocal liver metastasis. 4. Peritoneal and retroperitoneal metastasis. 5. Abnormal appearance of the pancreas. New from 10/24/2020 is edema and peripancreatic soft tissue stranding involving the body and tail of pancreas. Although the appearance is consistent with acute pancreatitis metastatic disease is not excluded. 6. Focal masslike enlargement involving the uncinate process of the pancreas which appears slightly hypodense to the adjacent normal parenchyma is indeterminate. This may represent an area of pancreatitis, metastatic disease or a primary pancreatic neoplasm. 7. Indeterminate right adrenal gland nodule. Cannot rule out metastatic disease. 8. There is a indeterminate lesion arising off the posterior cortex of the right kidney which does not meet criteria for a simple cyst. Similarly, there is a small complex lesion arising off the inferior pole of the right kidney. 9. Gallstones. 10. Trace left pleural effusion  may be partially loculated. 11. Aortic atherosclerosis and coronary artery calcifications. Aortic Atherosclerosis (ICD10-I70.0). Electronically Signed   By: Kerby Moors M.D.   On: 11/05/2020 13:47   IR US Guide Bx Asp/Drain  Result Date: 11/06/2020 INDICATION: Remote history of melanoma, now with metastatic disease of unknown primary. Please from ultrasound-guided biopsy of  amenable site of malignancy for tissue diagnostic purposes. Additionally, please place image guided Port a catheter for the initiation of chemotherapy. EXAM: 1. ULTRASOUND-GUIDED BIOPSY OF INDETERMINATE SUBCUTANEOUS NODULE ALONG THE RIGHT LATERAL ABDOMINAL WALL 2. IMPLANTED PORT A CATH PLACEMENT WITH ULTRASOUND AND FLUOROSCOPIC GUIDANCE COMPARISON:  CT the chest, abdomen and pelvis-11/05/2020 MEDICATIONS: None. ANESTHESIA/SEDATION: Moderate (conscious) sedation was employed during this procedure. A total of Versed 3 mg and Fentanyl 100 mcg was administered intravenously. Moderate Sedation Time: 56 minutes. The patient's level of consciousness and vital signs were monitored continuously by radiology nursing throughout the procedure under my direct supervision. CONTRAST:  None FLUOROSCOPY TIME:  36 seconds (60 mGy) COMPLICATIONS: None immediate. PROCEDURE: The procedure, risks, benefits, and alternatives were explained to the patient. Questions regarding the procedure were encouraged and answered. The patient understands and consents to the procedure. Sonographic evaluation was performed of the lateral aspect of the right abdominal wall demonstrating an approximately 1.3 x 0.8 cm hypoechoic nodule correlating with the dominant subcutaneous nodule seen on preceding abdominal CT image 89, series 2. This subcutaneous nodule was targeted for biopsy given location and sonographic window. The skin overlying the nodule was prepped and draped in usual sterile fashion. After the overlying soft tissues were anesthetized 1% lidocaine with epinephrine, 6 core needle biopsy samples were obtained with an 18 gauge core needle biopsy device. Samples were placed in formalin and submitted to pathology for analysis. Attention was now paid towards placement of the port a catheter. The right neck and chest were prepped with chlorhexidine in a sterile fashion, and a sterile drape was applied covering the operative field. Maximum barrier  sterile technique with sterile gowns and gloves were used for the procedure. A timeout was performed prior to the initiation of the procedure. Local anesthesia was provided with 1% lidocaine with epinephrine. After creating a small venotomy incision, a micropuncture kit was utilized to access the internal jugular vein. Real-time ultrasound guidance was utilized for vascular access including the acquisition of a permanent ultrasound image documenting patency of the accessed vessel. The microwire was utilized to measure appropriate catheter length. A subcutaneous port pocket was then created along the upper chest wall utilizing a combination of sharp and blunt dissection. The pocket was irrigated with sterile saline. A single lumen "Slim" sized power injectable port was chosen for placement. The 8 Fr catheter was tunneled from the port pocket site to the venotomy incision. The port was placed in the pocket. The external catheter was trimmed to appropriate length. At the venotomy, an 8 Fr peel-away sheath was placed over a guidewire under fluoroscopic guidance. The catheter was then placed through the sheath and the sheath was removed. Final catheter positioning was confirmed and documented with a fluoroscopic spot radiograph. The port was accessed with a Huber needle, aspirated and flushed with heparinized saline. The venotomy site was closed with an interrupted 4-0 Vicryl suture. The port pocket incision was closed with interrupted 2-0 Vicryl suture. The skin was opposed with a running subcuticular 4-0 Vicryl suture. Dermabond and Steri-strips were applied to both incisions. Dressings were applied. The patient tolerated the procedure well without immediate post procedural complication. FINDINGS: Sonographic evaluation  was performed of the lateral aspect of the right abdominal wall demonstrating an approximately 1.3 x 0.8 cm hypoechoic nodule correlating with the dominant subcutaneous nodule seen on preceding  abdominal CT image 89, series 2. This nodule was successfully biopsied with ultrasound guidance yielding the acquisition of adequate tissue. After catheter placement, the tip lies within the superior cavoatrial junction. The catheter aspirates and flushes normally and is ready for immediate use. IMPRESSION: 1. Technically successful ultrasound-guided core needle biopsy of indeterminate subcutaneous nodule within the right lateral abdominal wall. 2. Successful placement of a right internal jugular approach power injectable Port-A-Cath. The catheter is ready for immediate use. Electronically Signed   By: Sandi Mariscal M.D.   On: 11/06/2020 08:45   DG Chest Port 1 View  Result Date: 11/04/2020 CLINICAL DATA:  Shoulder pain, recent cancer diagnosis with RIGHT shoulder pain. EXAM: PORTABLE CHEST 1 VIEW COMPARISON:  CT of the abdomen and pelvis from outside facility on Oct 24, 2020. FINDINGS: Trachea midline. Cardiomediastinal contours and hilar structures with LEFT infrahilar lobulation in the setting of known LEFT lower lobe mass. Elevation of the RIGHT hemidiaphragm as before. No lobar consolidation.  No gross pleural effusion. Suspected small nodule at the LEFT lung base as well. On limited assessment, no acute skeletal process. IMPRESSION: 1. No acute cardiopulmonary disease in the setting of LEFT infrahilar lobulation with known LEFT lower lobe mass. 2. Other small basilar nodules not well assessed. Electronically Signed   By: Zetta Bills M.D.   On: 11/04/2020 16:54   DG Hip Unilat W or Wo Pelvis 2-3 Views Right  Result Date: 10/13/2020 CLINICAL DATA:  Right hip pain EXAM: DG HIP (WITH OR WITHOUT PELVIS) 2-3V RIGHT COMPARISON:  None. FINDINGS: There is no evidence of hip fracture or dislocation. There is no evidence of arthropathy or other focal bone abnormality. IMPRESSION: Negative. Electronically Signed   By: Fidela Salisbury MD   On: 10/13/2020 21:47   IR IMAGING GUIDED PORT INSERTION  Result Date:  11/06/2020 INDICATION: Remote history of melanoma, now with metastatic disease of unknown primary. Please from ultrasound-guided biopsy of amenable site of malignancy for tissue diagnostic purposes. Additionally, please place image guided Port a catheter for the initiation of chemotherapy. EXAM: 1. ULTRASOUND-GUIDED BIOPSY OF INDETERMINATE SUBCUTANEOUS NODULE ALONG THE RIGHT LATERAL ABDOMINAL WALL 2. IMPLANTED PORT A CATH PLACEMENT WITH ULTRASOUND AND FLUOROSCOPIC GUIDANCE COMPARISON:  CT the chest, abdomen and pelvis-11/05/2020 MEDICATIONS: None. ANESTHESIA/SEDATION: Moderate (conscious) sedation was employed during this procedure. A total of Versed 3 mg and Fentanyl 100 mcg was administered intravenously. Moderate Sedation Time: 56 minutes. The patient's level of consciousness and vital signs were monitored continuously by radiology nursing throughout the procedure under my direct supervision. CONTRAST:  None FLUOROSCOPY TIME:  36 seconds (60 mGy) COMPLICATIONS: None immediate. PROCEDURE: The procedure, risks, benefits, and alternatives were explained to the patient. Questions regarding the procedure were encouraged and answered. The patient understands and consents to the procedure. Sonographic evaluation was performed of the lateral aspect of the right abdominal wall demonstrating an approximately 1.3 x 0.8 cm hypoechoic nodule correlating with the dominant subcutaneous nodule seen on preceding abdominal CT image 89, series 2. This subcutaneous nodule was targeted for biopsy given location and sonographic window. The skin overlying the nodule was prepped and draped in usual sterile fashion. After the overlying soft tissues were anesthetized 1% lidocaine with epinephrine, 6 core needle biopsy samples were obtained with an 18 gauge core needle biopsy device. Samples were placed in formalin and  submitted to pathology for analysis. Attention was now paid towards placement of the port a catheter. The right neck and  chest were prepped with chlorhexidine in a sterile fashion, and a sterile drape was applied covering the operative field. Maximum barrier sterile technique with sterile gowns and gloves were used for the procedure. A timeout was performed prior to the initiation of the procedure. Local anesthesia was provided with 1% lidocaine with epinephrine. After creating a small venotomy incision, a micropuncture kit was utilized to access the internal jugular vein. Real-time ultrasound guidance was utilized for vascular access including the acquisition of a permanent ultrasound image documenting patency of the accessed vessel. The microwire was utilized to measure appropriate catheter length. A subcutaneous port pocket was then created along the upper chest wall utilizing a combination of sharp and blunt dissection. The pocket was irrigated with sterile saline. A single lumen "Slim" sized power injectable port was chosen for placement. The 8 Fr catheter was tunneled from the port pocket site to the venotomy incision. The port was placed in the pocket. The external catheter was trimmed to appropriate length. At the venotomy, an 8 Fr peel-away sheath was placed over a guidewire under fluoroscopic guidance. The catheter was then placed through the sheath and the sheath was removed. Final catheter positioning was confirmed and documented with a fluoroscopic spot radiograph. The port was accessed with a Huber needle, aspirated and flushed with heparinized saline. The venotomy site was closed with an interrupted 4-0 Vicryl suture. The port pocket incision was closed with interrupted 2-0 Vicryl suture. The skin was opposed with a running subcuticular 4-0 Vicryl suture. Dermabond and Steri-strips were applied to both incisions. Dressings were applied. The patient tolerated the procedure well without immediate post procedural complication. FINDINGS: Sonographic evaluation was performed of the lateral aspect of the right abdominal wall  demonstrating an approximately 1.3 x 0.8 cm hypoechoic nodule correlating with the dominant subcutaneous nodule seen on preceding abdominal CT image 89, series 2. This nodule was successfully biopsied with ultrasound guidance yielding the acquisition of adequate tissue. After catheter placement, the tip lies within the superior cavoatrial junction. The catheter aspirates and flushes normally and is ready for immediate use. IMPRESSION: 1. Technically successful ultrasound-guided core needle biopsy of indeterminate subcutaneous nodule within the right lateral abdominal wall. 2. Successful placement of a right internal jugular approach power injectable Port-A-Cath. The catheter is ready for immediate use. Electronically Signed   By: Simonne Come M.D.   On: 11/06/2020 08:45      IMPRESSION/PLAN: 1. 70 y.o. 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  Today, I talked to the patient and family about the findings and work-up thus far.  We discussed the natural history of bone and brain metastases from melanoma and general treatment, highlighting the role of radiotherapy in the management.  We discussed the available radiation techniques, and focused on the details of logistics and delivery.  We reviewed the anticipated acute and late sequelae associated with radiation in this setting.   The patient would like to proceed with radiation and will be scheduled for CT simulation later today to begin total course of 10 treatments tomorrow.  We personally spent 65 minutes in this encounter including chart review, reviewing radiological studies, meeting face-to-face with the patient, entering orders and completing documentation.    Marguarite Arbour, PA-C    Margaretmary Dys, MD  [facility]  Radiation Oncology Direct Dial: [phone]  Fax: [phone] [email or web address]  Skype  LinkedIn

## 2020-11-10 NOTE — Progress Notes (Signed)
  Radiation Oncology         ([phone]) [phone] ________________________________  Name: Kenon Delashmit MRN: [id number]  Date: 11/10/2020  DOB: Dec 11, 1950  INPATIENT  SIMULATION AND TREATMENT PLANNING NOTE    ICD-10-CM   1. Brain metastases ([place])  C79.31   2. 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  Identity was confirmed.  All relevant records and images related to the planned course of therapy were reviewed.  The patient freely provided informed written consent to proceed with treatment after reviewing the details related to the planned course of therapy. The consent form was witnessed and verified by the simulation staff.  Then, the patient was set-up in a stable reproducible  supine position for radiation therapy.  CT images were obtained.  Surface markings were placed.  The CT images were loaded into the planning software.  Then the target and avoidance structures were contoured.  Treatment planning then occurred.  The radiation prescription was entered and confirmed.  Then, I designed and supervised the construction of a total of 3 medically necessary complex treatment device including a custom made thermoplastic mask used for immobilization, and two MLC collimator apertures for radiotherapy from the right and left side, with independent collimation for each to account for beam divergence.  I have requested : Isodose Plan.    Hip:  Then, the patient was set-up in a stable reproducible supine position for pelvic radiation therapy.  CT images were obtained.  Surface markings were placed.  The CT images were loaded into the planning software.  Then the target and avoidance structures were contoured.  Treatment planning then occurred.  The radiation prescription was entered and confirmed.  Then, I designed and supervised the  construction of a total of 3 medically necessary complex treatment devices consisting of leg positioner and MLC apertures to cover the treated hip area.  I have requested : 3D Simulation  I have requested a DVH of the following structures: Rectum, Bladder, femoral heads and target.  PLAN:  The whole brain will be treated to 30 Gy in 10 fractions to the brain and right hip.  ________________________________  Sheral Apley. Tammi Klippel, M.D.

## 2020-11-10 NOTE — Progress Notes (Signed)
Daily Progress Note   Patient Name: Gary Young       Date: 11/10/2020 DOB: May 22, 1951  Age: 70 y.o. MRN#: [id number] Attending Physician: Gary Polite, MD Primary Care Physician: Gary Sacramento, MD Admit Date: 11/04/2020  Reason for Consultation/Follow-up: Establishing goals of care and Pain control  Subjective: Medical records reviewed. Assessed patient at the bedside and met with wife Gary Young at the bedside. Gary Young is resting comfortable, continues to be drowsy but gradually becomes more alert and conversational.  Gary Young shares that patient has been sleeping quite a bit since yesterday and she wonders whether this is due to lack of sleep at night. She also shares Gary Young has been complaining of shortness of breath. Patient confirms this and states that discomfort is under his ribs when he takes a deep breath. He reports this has improved somewhat since he started taking medications for heartburn. He also attributes his drowsiness to increased confusion, reporting difficulty in separating his days and nights.   Educated on the risk of increased side effects, such as drowsiness, after conversion of opioids from morphine to OxyContin. Educated on risk factors for delirium and important preventive measures. Discussed plans and rationale for radiology oncology and GI consults today. Also updated on PT's recommendations for [facility]. Gary Young and Gary Young are appreciative and hopeful to return home soon. They feel this goal is feasible, as Gary Young was able to walk to the bathroom with minimal assistance this morning.  Questions and concerns addressed. Encouraged to call with additional needs. PMT will continue to support holistically.   Length of Stay: 5  Current Medications: Scheduled Meds:  . Chlorhexidine Gluconate  Cloth  6 each Topical Daily  . cholecalciferol  2,000 Units Oral Daily  . doxazosin  8 mg Oral QPM  . enoxaparin (LOVENOX) injection  40 mg Subcutaneous Q24H  . feeding supplement  237 mL Oral BID BM  . latanoprost  1 drop Left Eye QHS  . metoprolol tartrate  25 mg Oral BID  . multivitamin with minerals  1 tablet Oral Daily  . oxyCODONE  20 mg Oral Q12H  . pantoprazole  40 mg Oral BID AC  . polyethylene glycol  17 g Oral Daily  . senna-docusate  2 tablet Oral BID  . temazepam  15 mg Oral QHS  . timolol  1 drop Both  Eyes Daily  . cyanocobalamin  1,000 mcg Oral Daily    Continuous Infusions: . dexamethasone (DECADRON) IVPB ([facility]) 20 mg (11/10/20 1001)    PRN Meds: acetaminophen, hydrOXYzine, labetalol, metoprolol tartrate, oxyCODONE, simethicone  Physical Exam Vitals and nursing note reviewed.  Constitutional:      Interventions: Nasal cannula in place.     Comments: 3.5L  Cardiovascular:     Rate and Rhythm: Normal rate.  Pulmonary:     Effort: Pulmonary effort is normal.  Skin:    General: Skin is warm and dry.  Neurological:     Mental Status: He is alert and oriented to person, place, and time.  Psychiatric:        Behavior: Behavior is cooperative.             Vital Signs: BP (!) 143/106   Pulse 80   Temp 98 F (36.7 C) (Oral)   Resp 18   Ht 6' (1.829 m)   Wt 105.4 kg   SpO2 96%   BMI 31.51 kg/m  SpO2: SpO2: 96 % O2 Device: O2 Device: Nasal Cannula O2 Flow Rate: O2 Flow Rate (L/min): 3 L/min  Intake/output summary:   Intake/Output Summary (Last 24 hours) at 11/10/2020 1009 Last data filed at 11/10/2020 0606 Gross per 24 hour  Intake 172 ml  Output 800 ml  Net -628 ml   LBM: Last BM Date: 11/09/20 Baseline Weight: Weight: 105.7 kg Most recent weight: Weight: 105.4 kg       Palliative Assessment/Data: 60%     Patient Active Problem List   Diagnosis Date Noted  . Liver masses   . Mass in neck   . 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  Malignant melanoma ([place])   . Goals of care, counseling/discussion   . Palliative care by specialist   . Intractable pain 11/04/2020    Palliative Care Assessment & Plan   Patient Profile: 70 y.o. male  with past medical history of melanoma s/p excision, iron deficiency anemia, and moderate protein calorie malnutrition admitted on 11/04/2020 with complaints of weakness and uncontrolled pain.   He was recently found to have metastatic diseaseon CT scan at outside facilityinvolving his lungs, liver, and bones with unknown primary. Referred to Dr. Marin Young and prescribed fentanyl patched and tramadol, without relief.   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, liver, brain, awaiting biopsy results Cancer-associated pain, well-controlled  Recommendations/Plan: Pain is well-controlled with Oxycontin BID and once daily need for PRN oxycodone Nursing order for delirium precautions Continue current interventions, awaiting radiology oncology and GI recommendations today Goal is to return home with [facility] and outpatient palliative care, continuing with radiation as outpatient PMT will provide ongoing support and coordination of care - patient and family aware I will be back on service 6/2  Goals of Care and Additional Recommendations: Limitations on Scope of Treatment: Full Scope Treatment  Code Status: DNR/DNI   Code Status Orders  (From admission, onward)         Start     Ordered   11/04/20 1830  Do not attempt resuscitation (DNR)  Continuous       Question Answer Comment  In the event of cardiac or respiratory ARREST Do not call a "code blue"   In the event of cardiac or respiratory ARREST Do not perform Intubation, CPR, defibrillation or ACLS   In the event of cardiac or respiratory ARREST Use medication by any route, position, wound care, and other measures to relive pain  and suffering. May use oxygen, suction and manual treatment of airway obstruction as needed for comfort.       11/04/20 1829        Code Status History    Date Active Date Inactive Code Status Order ID Comments User Context   11/04/2020 1728 11/04/2020 1829 Full Code [id number]  Kayleen Memos DO ED   Advance Care Planning Activity    Advance Directive Documentation   Flowsheet Row Most Recent Value  Type of Advance Directive Living will, Healthcare Power of Attorney  Pre-existing out of facility DNR order (yellow form or pink MOST form) --  "MOST" Form in Place? --      Prognosis:  Poor long-term prognosis given widely metastatic cancer  Discharge Planning: Home with Home Health and outpatient palliative care referral  Care plan was discussed with patient, patient's wife Gary Young  Total time: 25 minutes Greater than 50% of this time was spent in counseling and coordinating care related to the above assessment and plan.  Dorthy Cooler, PA-C Palliative Medicine Team Team phone # [phone]  Thank you for allowing the Palliative Medicine Team to assist in the care of this patient. Please utilize secure chat with additional questions, if there is no response within 30 minutes please call the above phone number.  Palliative Medicine Team providers are available by phone from 7am to 7pm daily and can be reached through the team cell phone.  Should this patient require assistance outside of these hours, please call the patient's attending physician.

## 2020-11-10 NOTE — Progress Notes (Signed)
Occupational Therapy Treatment Patient Details Name: Gary Young MRN: [id number] DOB: July 06, 1950 Today's Date: 11/10/2020    History of present illness Pt is 70 yo male w/ h/o melanoma 8 years ago that was excised.  Pt was found to have extensive metastatic CA on 10/24/20.   Pt admitted on 11/04/20 with widely metastatic CA, intractable bone and joint pain and hyponatremia.  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 Per oncology note likely pathological fx of clavicle on R.   Pt has been started on decadron.   OT comments  Patient progressing and showed improved ability to complete LE dressing with use of adaptive equipment compared to previous session in order to reduce caregiver burden and conserve energy.  Pt also provided with handouts on energy conservation and fall prevention strategies which were each briefly discussed.  Patient remains limited by pain today in RT hip, but the pain moves per chart review, as well as impaired activity tolerance, along with deficits noted below. Pt continues to demonstrate good rehab potential and would benefit from continued skilled OT to increase safety and independence with ADLs and functional transfers to allow pt to return home safely and reduce caregiver burden and fall risk.   Follow Up Recommendations  Home health OT    Equipment Recommendations  None recommended by OT    Recommendations for Other Services      Precautions / Restrictions Precautions Precautions: Fall Restrictions Weight Bearing Restrictions: No       Mobility Bed Mobility Overal bed mobility: Needs Assistance Bed Mobility: Sit to Supine       Sit to supine: Min guard   General bed mobility comments: Pt reported "dizziness" when transitioned from sit to supine. Pt required ~55min of supine rest then able to tolerate repositioning for comfort. Patient Response: Restless  Transfers Overall transfer level:  Needs assistance Equipment used: Rolling walker (2 wheeled) Transfers: Sit to/from Stand Sit to Stand: Min guard         General transfer comment: min guard for safety, multimodal cues for hand placement.    Balance Overall balance assessment: Needs assistance Sitting-balance support: No upper extremity supported Sitting balance-Leahy Scale: Good     Standing balance support: Bilateral upper extremity supported Standing balance-Leahy Scale: Fair Standing balance comment: RW ambulation but could stand statically without support                           ADL either performed or assessed with clinical judgement   ADL Overall ADL's : Needs assistance/impaired Eating/Feeding: Independent   Grooming: Wash/dry hands;Sitting Grooming Details (indicate cue type and reason): Pt declined standing at sink after toileting for hand hygiene due to fatigue.               Lower Body Dressing Details (indicate cue type and reason): Pt unable to completed figure 4 position with LLE due to weakness. Pt educated on adaptive equipment options. Pt used reacher to doff socks and ed on use of reacher for underwear/pants. Recommended dressing LLE first for ease and to don underwear and pants over feet before standing to conserve energy. Pt donned sock to LLE with sock aid and Min As (OT demo'd placing pt's sock on aid for education).. Pt/spouse also ed on use of long handled shoe horn Toilet Transfer: Min guard;Ambulation;RW Toilet Transfer Details (indicate cue type and reason): Cues to use wall mounted grab bar  for safety. Toileting- Water quality scientist and Hygiene: Min guard;Sit to/from stand Toileting - Clothing Manipulation Details (indicate cue type and reason): Min guard with increased time/effort to manage clothing. Pt able to complete peri hygiene with supervision.     Functional mobility during ADLs: Min guard;Rolling walker       Vision Patient Visual Report: No change  from baseline     Perception     Praxis      Cognition Arousal/Alertness: Awake/alert Behavior During Therapy: WFL for tasks assessed/performed Overall Cognitive Status: Within Functional Limits for tasks assessed                                          Exercises Other Exercises Other Exercises: Handouts provided on Fall prevention straegies and on energy conservation techniques. Briefly discussed main concepts and pt/spouse encouraged to read before next session.   Shoulder Instructions       General Comments      Pertinent Vitals/ Pain       Pain Assessment: 0-10 Pain Score: 4  Pain Location: RT HIP and "bloat" pain in ABD Pain Descriptors / Indicators: Discomfort;Sore;Grimacing Pain Intervention(s): Heat applied;Limited activity within patient's tolerance;Monitored during session;Premedicated before session;Repositioned  Home Living                                          Prior Functioning/Environment              Frequency  Min 2X/week        Progress Toward Goals  OT Goals(current goals can now be found in the care plan section)  Progress towards OT goals: Progressing toward goals  Acute Rehab OT Goals Patient Stated Goal: return home OT Goal Formulation: With patient Time For Goal Achievement: 11/21/20 Potential to Achieve Goals: Good  Plan Discharge plan remains appropriate    Co-evaluation                 AM-PAC OT "6 Clicks" Daily Activity     Outcome Measure   Help from another person eating meals?: None Help from another person taking care of personal grooming?: A Little Help from another person toileting, which includes using toliet, bedpan, or urinal?: A Little Help from another person bathing (including washing, rinsing, drying)?: A Little Help from another person to put on and taking off regular upper body clothing?: A Little Help from another person to put on and taking off regular lower  body clothing?: A Little 6 Click Score: 19    End of Session Equipment Utilized During Treatment: Rolling walker;Gait belt  OT Visit Diagnosis: Unsteadiness on feet (R26.81);Other abnormalities of gait and mobility (R26.89);Pain Pain - Right/Left: Right Pain - part of body: Hip (and ABD)   Activity Tolerance Patient tolerated treatment well   Patient Left in chair;with call bell/phone within reach;with chair alarm set;with family/visitor present   Nurse Communication  (Large bowel movement)        Time: 1127-1200 OT Time Calculation (min): 33 min  Charges: OT General Charges $OT Visit: 1 Visit OT Treatments $Self Care/Home Management : 8-22 mins $Therapeutic Activity: 8-22 mins  Anderson Malta, OT Acute Rehab Services Office: [phone] 11/10/2020   Julien Girt 11/10/2020, 12:22 PM

## 2020-11-10 NOTE — Consult Note (Signed)
Referring Provider: Dr. Domenic Polite [facility]) Primary Care Physician:  [name], MD Primary Gastroenterologist:  Althia Forts [facility])  Reason for Consultation:  Anemia, heme-positive stool  HPI: Gary Young is a 70 y.o. male with past medical history of metastatic melanoma presenting for consultation of anemia and heme positive stool.  Patient reports some upper abdominal pain today which he states feels like "gas pain." Reports poor appetite but has not recently lost any weight. He lost 10 lbs prior to melanoma diagnosis.  Denies nausea, vomiting, dysphagia.    Denies constipation or diarrhea.  Just hard a darker stool but is on iron. Denies hematochezia.  No ASA or NSAID use.  Lovenox during admission, but no outpatient blood thinners.  No known family history of colon cancer or gastrointestinal malignancy.  Prior EGD/colonoscopy, though he cannot recall where/when.  Past Medical History:  Diagnosis Date  . Asthma   . Cancer [facility])     Past Surgical History:  Procedure Laterality Date  . BACK SURGERY    . EYE SURGERY    . IR IMAGING GUIDED PORT INSERTION  11/05/2020  . IR US GUIDE BX ASP/DRAIN  11/05/2020  . SKIN BIOPSY    . TONSILLECTOMY      Prior to Admission medications   Medication Sig Start Date End Date Taking? Authorizing Provider  albuterol (VENTOLIN HFA) 108 (90 Base) MCG/ACT inhaler Inhale into the lungs. 10/01/20  Yes [provider]  Cholecalciferol (VITAMIN D) 50 MCG (2000 UT) tablet Take 2,000 Units by mouth daily.   Yes [provider]  cyanocobalamin 1000 MCG tablet Take 1,000 mcg by mouth daily.   Yes [provider]  cyclobenzaprine (FLEXERIL) 10 MG tablet Take 1 tablet by mouth 3 (three) times daily as needed. 10/21/20  Yes [provider]  doxazosin (CARDURA) 8 MG tablet Take 8 mg by mouth daily. 02/06/20  Yes [provider]  fentaNYL (DURAGESIC) 25 MCG/HR Place 1 patch onto the skin every 3 (three)  days. 10/27/20  Yes Volanda Napoleon, MD  ferrous sulfate 325 (65 FE) MG tablet Take 325 mg by mouth daily.   Yes [provider]  hydrOXYzine (ATARAX/VISTARIL) 25 MG tablet Take 1 tablet (25 mg total) by mouth 3 (three) times daily as needed. Patient taking differently: Take 25 mg by mouth 3 (three) times daily as needed for itching. 10/27/20  Yes Ennever, Rudell Cobb, MD  latanoprost (XALATAN) 0.005 % ophthalmic solution Place 1 drop into the left eye at bedtime. 10/01/20  Yes [provider]  timolol (TIMOPTIC) 0.5 % ophthalmic solution Place 1 drop into both eyes daily. 01/29/20  Yes [provider]  traMADol (ULTRAM) 50 MG tablet Take 50-100 mg by mouth 3 (three) times daily as needed for moderate pain. 10/26/20  Yes [provider]    Scheduled Meds: . Chlorhexidine Gluconate Cloth  6 each Topical Daily  . cholecalciferol  2,000 Units Oral Daily  . doxazosin  8 mg Oral QPM  . enoxaparin (LOVENOX) injection  40 mg Subcutaneous Q24H  . feeding supplement  237 mL Oral BID BM  . latanoprost  1 drop Left Eye QHS  . metoprolol tartrate  25 mg Oral BID  . multivitamin with minerals  1 tablet Oral Daily  . oxyCODONE  20 mg Oral Q12H  . pantoprazole  40 mg Oral BID AC  . polyethylene glycol  17 g Oral Daily  . senna-docusate  2 tablet Oral BID  . temazepam  15 mg Oral  QHS  . timolol  1 drop Both Eyes Daily  . cyanocobalamin  1,000 mcg Oral Daily   Continuous Infusions: . dexamethasone (DECADRON) IVPB ([facility]) 20 mg (11/10/20 1001)   PRN Meds:.acetaminophen, hydrOXYzine, labetalol, metoprolol tartrate, oxyCODONE, simethicone  Allergies as of 11/04/2020  . (No Known Allergies)    History reviewed. No pertinent family history.  Social History   Socioeconomic History  . Marital status: Married    Spouse name: Not on file  . Number of children: Not on file  . Years of education: Not on file  . Highest education level: Not on file  Occupational History  .  Not on file  Tobacco Use  . Smoking status: Never Smoker  . Smokeless tobacco: Never Used  Vaping Use  . Vaping Use: Never used  Substance and Sexual Activity  . Alcohol use: Yes    Alcohol/week: 2.0 standard drinks    Types: 1 Glasses of wine, 1 Cans of beer per week    Comment: Socially  . Drug use: Never  . Sexual activity: Not on file  Other Topics Concern  . Not on file  Social History Narrative  . Not on file   Social Determinants of Health   Financial Resource Strain: Not on file  Food Insecurity: Not on file  Transportation Needs: Not on file  Physical Activity: Not on file  Stress: Not on file  Social Connections: Not on file  Intimate Partner Violence: Not on file    Review of Systems: Review of Systems  Constitutional: Positive for malaise/fatigue. Negative for chills and fever.  HENT: Negative for sore throat.   Eyes: Negative for pain and redness.  Respiratory: Negative for cough, shortness of breath and stridor.   Cardiovascular: Negative for chest pain and palpitations.  Gastrointestinal: Positive for abdominal pain. Negative for blood in stool, constipation, diarrhea, heartburn, melena, nausea and vomiting.  Genitourinary: Negative for flank pain and hematuria.  Musculoskeletal: Positive for joint pain. Negative for falls.  Skin: Negative for itching and rash.  Neurological: Negative for seizures and loss of consciousness.  Endo/Heme/Allergies: Negative for polydipsia. Does not bruise/bleed easily.  Psychiatric/Behavioral: Negative for substance abuse. The patient is not nervous/anxious.     Physical Exam: Physical Exam Constitutional:      General: He is not in acute distress. HENT:     Head: Normocephalic and atraumatic.     Nose: Nose normal. No congestion.     Mouth/Throat:     Mouth: Mucous membranes are moist.     Pharynx: Oropharynx is clear.  Eyes:     General: No scleral icterus.    Extraocular Movements: Extraocular movements intact.      Conjunctiva/sclera: Conjunctivae normal.  Cardiovascular:     Rate and Rhythm: Normal rate and regular rhythm.  Pulmonary:     Effort: Pulmonary effort is normal. No respiratory distress.  Abdominal:     General: Bowel sounds are normal. There is no distension.     Palpations: Abdomen is soft. There is no mass.     Tenderness: There is abdominal tenderness (moderate epigastric). There is guarding (voluntary). There is no rebound.     Hernia: No hernia is present.  Musculoskeletal:        General: No swelling or tenderness.     Cervical back: Normal range of motion and neck supple.  Skin:    General: Skin is warm and dry.  Neurological:     General: No focal deficit present.     Mental  Status: He is alert and oriented to person, place, and time.  Psychiatric:        Mood and Affect: Mood normal.        Behavior: Behavior normal. Behavior is cooperative.     Vital signs: Vitals:   11/10/20 0050 11/10/20 0522  BP: (!) 147/97 (!) 143/106  Pulse: 73 80  Resp:  18  Temp:  98 F (36.7 C)  SpO2:  96%   Last BM Date: 11/10/20   GI:  Lab Results: Recent Labs    11/09/20 0754 11/10/20 0619  WBC 20.3* 19.1*  HGB 9.9* 9.7*  HCT 33.3* 31.9*  PLT 342 352   BMET Recent Labs    11/10/20 0619  NA 136  K 4.7  CL 99  CO2 31  GLUCOSE 135*  BUN 34*  CREATININE 0.74  CALCIUM 8.1*   LFT No results for input(s): PROT, ALBUMIN, AST, ALT, ALKPHOS, BILITOT, BILIDIR, IBILI in the last 72 hours. PT/INR No results for input(s): LABPROT, INR in the last 72 hours.   Studies/Results: No results found.  Impression: Anemia, heme-positive stool. 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  No abnormal findings of stomach/bowel on recent CT. 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 Marin Olp recommends endoscopy to evaluate for melanoma in GI tract, which could impact systemic therapy.  Thus, we will proceed with EGD tomorrow.  Pending findings, consider colonoscopy thereafter.  I thoroughly discussed the procedure with the patient to include nature, alternatives, benefits, and risks (including but not limited to bleeding, infection, perforation, anesthesia/cardiac and pulmonary complications). Patient verbalized understanding and gave verbal consent to proceed with EGD.  Continue Protonix BID.  Soft diet, NPO at midnight.  [facility] will follow.   LOS: 5 days   Salley Slaughter  PA-C 11/10/2020, 11:35 AM  Contact #  [phone]

## 2020-11-11 ENCOUNTER — Inpatient Hospital Stay (HOSPITAL_COMMUNITY): Payer: Medicare HMO | Admitting: Certified Registered Nurse Anesthetist

## 2020-11-11 ENCOUNTER — Encounter: Payer: Self-pay | Admitting: *Deleted

## 2020-11-11 ENCOUNTER — Encounter (HOSPITAL_COMMUNITY): Payer: Self-pay | Admitting: Internal Medicine

## 2020-11-11 ENCOUNTER — Ambulatory Visit
Admission: RE | Admit: 2020-11-11 | Discharge: 2020-11-11 | Disposition: A | Discharge status: Home / Self Care | Payer: Medicare HMO | Source: Ambulatory Visit | Attending: Radiation Oncology | Admitting: Radiation Oncology

## 2020-11-11 ENCOUNTER — Encounter (HOSPITAL_COMMUNITY)
Admission: EM | Disposition: A | Discharge status: Home Health | Payer: Self-pay | Source: Home / Self Care | Attending: Internal Medicine

## 2020-11-11 ENCOUNTER — Inpatient Hospital Stay (HOSPITAL_COMMUNITY): Payer: Medicare HMO

## 2020-11-11 DIAGNOSIS — C438 Malignant melanoma of overlapping sites of skin: Secondary | ICD-10-CM | POA: Diagnosis not present

## 2020-11-11 DIAGNOSIS — C439 Malignant melanoma of skin, unspecified: Secondary | ICD-10-CM

## 2020-11-11 DIAGNOSIS — G893 Neoplasm related pain (acute) (chronic): Secondary | ICD-10-CM | POA: Diagnosis not present

## 2020-11-11 DIAGNOSIS — R609 Edema, unspecified: Secondary | ICD-10-CM

## 2020-11-11 DIAGNOSIS — Z7189 Other specified counseling: Secondary | ICD-10-CM | POA: Diagnosis not present

## 2020-11-11 DIAGNOSIS — Z515 Encounter for palliative care: Secondary | ICD-10-CM | POA: Diagnosis not present

## 2020-11-11 HISTORY — PX: BIOPSY: SHX5522

## 2020-11-11 HISTORY — PX: ESOPHAGOGASTRODUODENOSCOPY: SHX5428

## 2020-11-11 LAB — CREATININE, SERUM
Creatinine, Ser: 0.63 mg/dL (ref 0.61–1.24)
GFR, Estimated: >60 mL/min (ref >60)

## 2020-11-11 LAB — CBC WITH DIFFERENTIAL/PLATELET
Abs Immature Granulocytes: 0.33 10*3/uL — ABNORMAL HIGH (ref 0.00–0.07)
Basophils Absolute: 0 10*3/uL (ref 0.0–0.1)
Basophils Relative: 0 %
Eosinophils Absolute: 0 10*3/uL (ref 0.0–0.5)
Eosinophils Relative: 0 %
HCT: 32.1 % — ABNORMAL LOW (ref 39.0–52.0)
Hemoglobin: 9.9 g/dL — ABNORMAL LOW (ref 13.0–17.0)
Immature Granulocytes: 2 %
Lymphocytes Relative: 4 %
Lymphs Abs: 0.9 10*3/uL (ref 0.7–4.0)
MCH: 26.1 pg (ref 26.0–34.0)
MCHC: 30.8 g/dL (ref 30.0–36.0)
MCV: 84.7 fL (ref 80.0–100.0)
Monocytes Absolute: 1.9 10*3/uL — ABNORMAL HIGH (ref 0.1–1.0)
Monocytes Relative: 10 %
Neutro Abs: 17 10*3/uL — ABNORMAL HIGH (ref 1.7–7.7)
Neutrophils Relative %: 84 %
Platelets: 264 10*3/uL (ref 150–400)
RBC: 3.79 MIL/uL — ABNORMAL LOW (ref 4.22–5.81)
RDW: 18.4 % — ABNORMAL HIGH (ref 11.5–15.5)
WBC: 20.2 10*3/uL — ABNORMAL HIGH (ref 4.0–10.5)
nRBC: 0 % (ref 0.0–0.2)

## 2020-11-11 LAB — SURGICAL PATHOLOGY

## 2020-11-11 SURGERY — EGD (ESOPHAGOGASTRODUODENOSCOPY)
Anesthesia: Monitor Anesthesia Care

## 2020-11-11 MED ORDER — SODIUM CHLORIDE 0.9 % IV SOLN: INTRAVENOUS | Status: DC

## 2020-11-11 MED ORDER — PROPOFOL 500 MG/50ML IV EMUL
INTRAVENOUS | Status: AC
  Filled 2020-11-11: qty 50

## 2020-11-11 MED ORDER — LIDOCAINE 2% (20 MG/ML) 5 ML SYRINGE
INTRAMUSCULAR | Status: DC | PRN
  Administered 2020-11-11: 40 mg via INTRAVENOUS

## 2020-11-11 MED ORDER — PROPOFOL 10 MG/ML IV BOLUS
INTRAVENOUS | Status: DC | PRN
  Administered 2020-11-11: 10 mg via INTRAVENOUS

## 2020-11-11 MED ORDER — PROPOFOL 500 MG/50ML IV EMUL
INTRAVENOUS | Status: DC | PRN
  Administered 2020-11-11: 100 ug/kg/min via INTRAVENOUS

## 2020-11-11 MED ORDER — ONDANSETRON HCL 4 MG/2ML IJ SOLN
INTRAMUSCULAR | Status: DC | PRN
  Administered 2020-11-11: 4 mg via INTRAVENOUS

## 2020-11-11 MED ORDER — HYDROMORPHONE HCL 1 MG/ML IJ SOLN
1.0000 mg | Freq: Once | INTRAMUSCULAR | Status: DC
Start: 2020-11-11 — End: 2020-11-13
  Filled 2020-11-11: qty 1

## 2020-11-11 MED ORDER — NYSTATIN 100000 UNIT/ML MT SUSP
5.0000 mL | Freq: Four times a day (QID) | OROMUCOSAL | Status: DC
  Administered 2020-11-11 – 2020-11-13 (×7): 500000 [IU] via ORAL
  Filled 2020-11-11 (×7): qty 5

## 2020-11-11 MED ORDER — PROPOFOL 1000 MG/100ML IV EMUL
INTRAVENOUS | Status: AC
  Filled 2020-11-11: qty 100

## 2020-11-11 MED ORDER — LACTATED RINGERS IV SOLN: INTRAVENOUS | Status: DC

## 2020-11-11 MED ORDER — HYDROMORPHONE HCL 1 MG/ML IJ SOLN
1.0000 mg | Freq: Once | INTRAMUSCULAR | Status: AC
  Administered 2020-11-11: 1 mg via INTRAVENOUS
  Filled 2020-11-11: qty 1

## 2020-11-11 NOTE — Progress Notes (Signed)
PROGRESS NOTE    Gary Young  RDE:[id number] DOB: [birth year] DOA: 11/04/2020 PCP: [name], MD  Brief Narrative: Gary Young is a 89/M with h/o melanoma diagnosed 8 years ago and excised, seasonal allergies, recently diagnosed iron deficiency anemia, who was recently found to have metastatic disease on CT scan at outside facility involving his lungs, liver, and bones with unknown primary.  then referred to oncology Dr. Marin Olp.  He was prescribed fentanyl patches and tramadol for severe pain, continued to be extremely debilitated by severe pain in hands was admitted as a direct admission from oncology for further diagnosis and symptom control -Started on a morphine PCA on admission, eventually transition to OxyContin and oxycodone -Underwent Port-A-Cath placement, CT chest abdomen pelvis which showed diffuse extensive metastasis, MRI brain showed multiple brain mets as well, started on IV Decadron, radiation oncology consulted -Underwent biopsy of subcutaneous nodule, path still pending -Starting XRT today -Also noted to have iron deficiency anemia and heme positive stools, oncology recommended GI evaluation to assess for melanoma in his GI tract, plan for endoscopy today -Today 6/1 started complaining of right calf pain  Assessment & Plan:   Widely metastatic cancer Brain metastasis -Previous history of melanoma resected 8 years ago, recent CT from 5/14 revealed extensive metastatic disease affecting his lower lungs, hepatic metastasis, retroperitoneal masses, soft tissue mass along the right psoas muscle, lytic lesions involving the thoracolumbar spine and bony pelvis.   -Clinically much improved symptom wise on morphine PCA, transitioned to oral OxyContin and oxycodone yesterday, symptoms stable, continue laxatives -Dr. Marin Olp following, completed staging CT, Port-A-Cath placement and biopsy of right neck nodule -Pathology still pending -MRI brain with multiple nonhemorrhagic  metastasis, thoracic spinal disease noted as well -Per Dr. Marin Olp , continue IV steroids, radiation oncology consulted, starting XRT today -Palliative care team following,  -Continue physical therapy  Right calf pain -Check Dopplers, if he has DVT will need IVC filter, in the setting of brain metastasis would not be a good candidate for systemic anticoagulation  Intractable diffuse bones and joints pain -Off morphine PCA, stable on OxyContin/oxycodone -Continue MiraLAX and Senokot  Iron deficiency and anemia of chronic disease -Dr. Marin Olp wants GI evaluation to rule out melanoma of the GI tract [facility] gastroenterology following, plan for endoscopy today  Hypovolemic hyponatremia -Improving, monitor  Moderate protein calorie malnutrition -Supplements as tolerated  Iron deficiency anemia, suspect related to metastatic disease, likely anemia of chronic disease. -Given IV iron -Hemoglobin stable  Leukocytosis -Suspected to be secondary to advanced cancer and IV steroids, afebrile nontoxic, continue to monitor  DVT prophylaxis: Subcu Lovenox daily Code Status: DNR  Family Communication:  Discussed with wife at bedside  Disposition Plan:  Status is: Inpatient  Remains inpatient appropriate because:Inpatient level of care appropriate due to severity of illness   Dispo:  Patient From: Home  Planned Disposition: Home with Health Care Svc  Medically stable for discharge: No     Consultants:   Oncology, palliative care, IR  Gastroenterology   Procedures: EGD today Port-A-Cath 5/26 Subcutaneous nodule biopsy 5/26 IR  Antimicrobials:    Subjective: -Ambulated in the halls yesterday, wife reports that he started complaining of right leg calf pain last night and this morning, denies any chest pain or shortness of breath -N.p.o. for endoscopy  Objective: Vitals:   11/11/20 1127 11/11/20 1130 11/11/20 1147 11/11/20 1151  BP: 116/66 117/68 100/62 (!) 92/59   Pulse: 87 89 89 88  Resp: 18 20 19 17   Temp: 98.3 F (  36.8 C)     TempSrc: Oral     SpO2: 100% 98% 97% 98%  Weight:      Height:        Intake/Output Summary (Last 24 hours) at 11/11/2020 1201 Last data filed at 11/11/2020 1129 Gross per 24 hour  Intake 591.18 ml  Output --  Net 591.18 ml   Filed Weights   11/04/20 1525 11/05/20 0136  Weight: 105.7 kg 105.4 kg    Examination:  General exam: Obese chronically ill male, laying in bed, AAOx3, no distress HEENT: Right neck nodular mass, positive pallor CVS: S1-S2, regular rate rhythm regular lungs: Decreased breath sounds at the bases Abdomen: Soft, nontender, nondistended, bowel sounds present Extremities: Trace edema  Neuro: Mild left lower extremity weakness Skin: No rashes on exposed skin  Data Reviewed:   CBC: Recent Labs  Lab 11/04/20 1547 11/05/20 0512 11/06/20 0557 11/07/20 0431 11/09/20 0754 11/10/20 0619 11/11/20 0423  WBC 12.8*   < > 13.7* 16.8* 20.3* 19.1* 20.2*  NEUTROABS 10.0*  --  10.1* 14.9*  --  15.5* 17.0*  HGB 9.2*   < > 8.8* 8.9* 9.9* 9.7* 9.9*  HCT 29.9*   < > 28.9* 29.1* 33.3* 31.9* 32.1*  MCV 82.4   < > 84.5 83.4 85.4 85.1 84.7  PLT 409*   < > 359 352 342 352 264   < > = values in this interval not displayed.   Basic Metabolic Panel: Recent Labs  Lab 11/04/20 1547 11/05/20 0512 11/06/20 0557 11/07/20 0431 11/10/20 0619 11/11/20 0423  NA 131* 135 133* 137 136  --   K 4.4 4.7 4.4 4.3 4.7  --   CL 93* 94* 93* 97* 99  --   CO2 28 34* 29 29 31   --   GLUCOSE 133* 133* 106* 203* 135*  --   BUN 21 22 20 23  34*  --   CREATININE 0.80 0.78 0.57* 0.52* 0.74 0.63  CALCIUM 9.2 9.2 9.3 9.3 8.1*  --   MG  --  2.0  --   --   --   --   PHOS  --  4.5  --   --   --   --    GFR: Estimated Creatinine Clearance: 106.3 mL/min (by C-G formula based on SCr of 0.63 mg/dL). Liver Function Tests: Recent Labs  Lab 11/04/20 1547 11/05/20 0512 11/06/20 0557 11/07/20 0431  AST 30 29 26 28   ALT 26 23  23 26   ALKPHOS 109 105 102 105  BILITOT 0.6 0.3 0.7 0.4  PROT 5.9* 5.5* 5.3* 5.8*  ALBUMIN 2.8* 2.7* 2.5* 2.7*   No results for input(s): LIPASE, AMYLASE in the last 168 hours. No results for input(s): AMMONIA in the last 168 hours. Coagulation Profile: No results for input(s): INR, PROTIME in the last 168 hours. Cardiac Enzymes: No results for input(s): CKTOTAL, CKMB, CKMBINDEX, TROPONINI in the last 168 hours. BNP (last 3 results) No results for input(s): PROBNP in the last 8760 hours. HbA1C: No results for input(s): HGBA1C in the last 72 hours. CBG: No results for input(s): GLUCAP in the last 168 hours. Lipid Profile: No results for input(s): CHOL, HDL, LDLCALC, TRIG, CHOLHDL, LDLDIRECT in the last 72 hours. Thyroid Function Tests: No results for input(s): TSH, T4TOTAL, FREET4, T3FREE, THYROIDAB in the last 72 hours. Anemia Panel: No results for input(s): VITAMINB12, FOLATE, FERRITIN, TIBC, IRON, RETICCTPCT in the last 72 hours. Urine analysis:    Component Value Date/Time   COLORURINE YELLOW 11/04/2020  [place] (A) 11/04/2020 2200   LABSPEC 1.026 11/04/2020 2200   PHURINE 5.0 11/04/2020 2200   GLUCOSEU NEGATIVE 11/04/2020 2200   HGBUR NEGATIVE 11/04/2020 2200   BILIRUBINUR NEGATIVE 11/04/2020 2200   KETONESUR 5 (A) 11/04/2020 2200   PROTEINUR NEGATIVE 11/04/2020 2200   NITRITE NEGATIVE 11/04/2020 2200   LEUKOCYTESUR NEGATIVE 11/04/2020 2200   Sepsis Labs: @LABRCNTIP (procalcitonin:4,lacticidven:4)  ) Recent Results (from the past 240 hour(s))  Resp Panel by RT-PCR (Flu A&B, Covid) Nasopharyngeal Swab     Status: None   Collection Time: 11/04/20  4:44 PM   Specimen: Nasopharyngeal Swab; Nasopharyngeal(NP) swabs in vial transport medium  Result Value Ref Range Status   SARS Coronavirus 2 by RT PCR NEGATIVE NEGATIVE Final    Comment: (NOTE) SARS-CoV-2 target nucleic acids are NOT DETECTED.  The SARS-CoV-2 RNA is generally detectable in upper  respiratory specimens during the acute phase of infection. The lowest concentration of SARS-CoV-2 viral copies this assay can detect is 138 copies/mL. A negative result does not preclude SARS-Cov-2 infection and should not be used as the sole basis for treatment or other patient management decisions. A negative result may occur with  improper specimen collection/handling, submission of specimen other than nasopharyngeal swab, presence of viral mutation(s) within the areas targeted by this assay, and inadequate number of viral copies(<138 copies/mL). A negative result must be combined with clinical observations, patient history, and epidemiological information. The expected result is Negative.  Fact Sheet for Patients:  EntrepreneurPulse.com.au  Fact Sheet for Healthcare Providers:  IncredibleEmployment.be  This test is no t yet approved or cleared by the [place] FDA and  has been authorized for detection and/or diagnosis of SARS-CoV-2 by FDA under an Emergency Use Authorization (EUA). This EUA will remain  in effect (meaning this test can be used) for the duration of the COVID-19 declaration under Section 564(b)(1) of the Act, 21 U.S.C.section 360bbb-3(b)(1), unless the authorization is terminated  or revoked sooner.       Influenza A by PCR NEGATIVE NEGATIVE Final   Influenza B by PCR NEGATIVE NEGATIVE Final    Comment: (NOTE) The Xpert Xpress SARS-CoV-2/FLU/RSV plus assay is intended as an aid in the diagnosis of influenza from Nasopharyngeal swab specimens and should not be used as a sole basis for treatment. Nasal washings and aspirates are unacceptable for Xpert Xpress SARS-CoV-2/FLU/RSV testing.  Fact Sheet for Patients: EntrepreneurPulse.com.au  Fact Sheet for Healthcare Providers: IncredibleEmployment.be  This test is not yet approved or cleared by the [place] FDA and has been  authorized for detection and/or diagnosis of SARS-CoV-2 by FDA under an Emergency Use Authorization (EUA). This EUA will remain in effect (meaning this test can be used) for the duration of the COVID-19 declaration under Section 564(b)(1) of the Act, 21 U.S.C. section 360bbb-3(b)(1), unless the authorization is terminated or revoked.  Performed at [facility], [place] [address] [place], [place] [address]     Radiology Studies: VAS [place] LOWER EXTREMITY VENOUS (DVT)  Result Date: 11/11/2020  Lower Venous DVT Study Patient Name:  ANASTASIOS MELANDER  Date of Exam:   11/11/2020 Medical Rec #: [id number]      Accession #:    [id number] Date of Birth: 06-15-1950      Patient Gender: M Patient Age:   69Y Exam Location:  [facility] Procedure:      VAS [place] LOWER EXTREMITY VENOUS (DVT) Referring Phys: [place] --------------------------------------------------------------------------------  Indications: Edema.  Risk Factors: None identified.  Limitations: Poor ultrasound/tissue interface. Comparison Study: No prior studies. Performing Technologist: Oliver Hum RVT  Examination Guidelines: A complete evaluation includes B-mode imaging, spectral Doppler, color Doppler, and power Doppler as needed of all accessible portions of each vessel. Bilateral testing is considered an integral part of a complete examination. Limited examinations for reoccurring indications may be performed as noted. The reflux portion of the exam is performed with the patient in reverse Trendelenburg.  +---------+---------------+---------+-----------+----------+--------------+ RIGHT    CompressibilityPhasicitySpontaneityPropertiesThrombus Aging +---------+---------------+---------+-----------+----------+--------------+ CFV      Full           Yes      Yes                                 +---------+---------------+---------+-----------+----------+--------------+ SFJ      Full                                                         +---------+---------------+---------+-----------+----------+--------------+ FV Prox  Full                                                        +---------+---------------+---------+-----------+----------+--------------+ FV Mid   Full                                                        +---------+---------------+---------+-----------+----------+--------------+ FV DistalFull                                                        +---------+---------------+---------+-----------+----------+--------------+ PFV      Full                                                        +---------+---------------+---------+-----------+----------+--------------+ POP      Full           Yes      Yes                                 +---------+---------------+---------+-----------+----------+--------------+ PTV      Full                                                        +---------+---------------+---------+-----------+----------+--------------+ PERO     Full                                                        +---------+---------------+---------+-----------+----------+--------------+   +---------+---------------+---------+-----------+----------+-------------------+  LEFT     CompressibilityPhasicitySpontaneityPropertiesThrombus Aging      +---------+---------------+---------+-----------+----------+-------------------+ CFV      Full           Yes      Yes                                      +---------+---------------+---------+-----------+----------+-------------------+ SFJ      Full                                                             +---------+---------------+---------+-----------+----------+-------------------+ FV Prox  Full                                                             +---------+---------------+---------+-----------+----------+-------------------+ FV Mid   Full                                                              +---------+---------------+---------+-----------+----------+-------------------+ FV DistalFull                                                             +---------+---------------+---------+-----------+----------+-------------------+ PFV      Full                                                             +---------+---------------+---------+-----------+----------+-------------------+ POP      Full           Yes      Yes                                      +---------+---------------+---------+-----------+----------+-------------------+ PTV      Full                                                             +---------+---------------+---------+-----------+----------+-------------------+ PERO                                                  Not well visualized +---------+---------------+---------+-----------+----------+-------------------+    Summary: RIGHT: - There is no evidence  of deep vein thrombosis in the lower extremity.  - No cystic structure found in the popliteal fossa.  LEFT: - There is no evidence of deep vein thrombosis in the lower extremity. However, portions of this examination were limited- see technologist comments above.  - No cystic structure found in the popliteal fossa.  *See table(s) above for measurements and observations.    Preliminary     Scheduled Meds: . [MAR Hold] Chlorhexidine Gluconate Cloth  6 each Topical Daily  . [MAR Hold] cholecalciferol  2,000 Units Oral Daily  . [MAR Hold] doxazosin  8 mg Oral QPM  . [MAR Hold] enoxaparin (LOVENOX) injection  40 mg Subcutaneous Q24H  . [MAR Hold] feeding supplement  237 mL Oral BID BM  . [MAR Hold]  HYDROmorphone (DILAUDID) injection  1 mg Intravenous Once  . [MAR Hold] latanoprost  1 drop Left Eye QHS  . [MAR Hold] metoprolol tartrate  25 mg Oral BID  . [MAR Hold] multivitamin with minerals  1 tablet Oral Daily  . [MAR Hold] oxyCODONE  20 mg Oral Q12H  .  [MAR Hold] pantoprazole  40 mg Oral BID AC  . [MAR Hold] polyethylene glycol  17 g Oral Daily  . [MAR Hold] senna-docusate  2 tablet Oral BID  . [MAR Hold] temazepam  15 mg Oral QHS  . [MAR Hold] timolol  1 drop Both Eyes Daily  . [MAR Hold] cyanocobalamin  1,000 mcg Oral Daily   Continuous Infusions: . sodium chloride    . [MAR Hold] dexamethasone (DECADRON) IVPB ([facility]) Stopped (11/10/20 1016)  . lactated ringers 10 mL/hr at 11/11/20 1107     LOS: 6 days   Time spent: 47min  Domenic Polite, MD [facility] 11/11/2020, 12:01 PM

## 2020-11-11 NOTE — Progress Notes (Signed)
PT Cancellation Note  Patient Details Name: Gary Young MRN: [id number] DOB: [birth year]   Cancelled Treatment:    Reason Eval/Treat Not Completed: Patient at procedure or test/unavailable   Duane Trias,KATHrine E 11/11/2020, 10:22 AM Arlyce Dice, DPT Acute Rehabilitation Services Pager: [phone] Office: [phone]

## 2020-11-11 NOTE — Anesthesia Procedure Notes (Signed)
Procedure Name: MAC Date/Time: 11/11/2020 11:07 AM Performed by: Maxwell Caul, CRNA Pre-anesthesia Checklist: Patient identified, Emergency Drugs available, Suction available and Patient being monitored Oxygen Delivery Method: Simple face mask

## 2020-11-11 NOTE — Transfer of Care (Signed)
Immediate Anesthesia Transfer of Care Note  Patient: Gary Young  Procedure(s) Performed: ESOPHAGOGASTRODUODENOSCOPY (EGD) (N/A ) BIOPSY  Patient Location: PACU and Endoscopy Unit  Anesthesia Type:MAC  Level of Consciousness: awake, alert  and oriented  Airway & Oxygen Therapy: Patient Spontanous Breathing and Patient connected to face mask oxygen  Post-op Assessment: Report given to RN and Post -op Vital signs reviewed and stable  Post vital signs: Reviewed and stable  Last Vitals:  Vitals Value Taken Time  BP    Temp    Pulse    Resp    SpO2      Last Pain:  Vitals:   11/11/20 1025  TempSrc: Oral  PainSc: 4       Patients Stated Pain Goal: 0 (09/24/62 3837)  Complications: No complications documented.

## 2020-11-11 NOTE — Progress Notes (Signed)
Bilateral lower extremity venous duplex has been completed. Preliminary results can be found in CV Proc through chart review.   11/11/20 11:09 AM Gary Young RVT

## 2020-11-11 NOTE — Progress Notes (Signed)
I appreciate the consultations from Radiation Oncology and Gastroenterology.  As always, they are incredibly helpful and insightful.  He will have an upper endoscopy today.  We will have to see if he has melanoma in his lesions in the upper GI tract.  The pathology is still not back yet from his biopsy.  His labs white count 20.2.  Hemoglobin 9.9.  Platelet count 264,000.  He said he had a tough night because of pain in the left hip area.  Hopefully, radiation oncology will be able to help with the pain.  He sat up in a chair yesterday.  He says he is eating better.  His wife was with his this morning.  She was able to help with.  He does have some episodes of disorientation.  I suspect this probably is a combination of his medications.  He has had no obvious melena.  There is been no diarrhea.  There has been no cough or shortness of breath.  I think the real question is whether or not he is going to be able to do radiation as an outpatient.  This really might be a tough job for his wife.  I still know if he is all that mobile.  On his physical exam, his vital signs are temperature 98.2.  Pulse 81.  Blood pressure 153/86.  Overall, there was no change in his physical exam.  He will be incredibly interesting to see with the upper endoscopy shows.  Hopefully, the pathology will be back today.  If this is truly melanoma and I have to believe that this is melanoma, we will have to get the BRAF analysis.  I do appreciate the wonderful care that Mr. Boldman is getting from the staff of [facility] [address].  Lattie Haw, MD  [place] 7:7-8

## 2020-11-11 NOTE — Op Note (Signed)
[facility] Patient Name: Gary Young Procedure Date: 11/11/2020 MRN: [id number] Attending MD: Otis Brace , MD Date of Birth: 1950/10/06 CSN: [id number] Age: 70 Admit Type: Inpatient Procedure:                Upper GI endoscopy Indications:              Iron deficiency anemia, Heme positive stool Providers:                Otis Brace, MD, Josie Dixon, RN, Janee Morn, Technician Referring MD:              Medicines:                Sedation Administered by an Anesthesia Professional Complications:            No immediate complications. Estimated Blood Loss:     Estimated blood loss was minimal. Procedure:                Pre-Anesthesia Assessment:                           - Prior to the procedure, a History and Physical                            was performed, and patient medications and                            allergies were reviewed. The patient's tolerance of                            previous anesthesia was also reviewed. The risks                            and benefits of the procedure and the sedation                            options and risks were discussed with the patient.                            All questions were answered, and informed consent                            was obtained. Prior Anticoagulants: The patient has                            taken no previous anticoagulant or antiplatelet                            agents. ASA Grade Assessment: III - A patient with                            severe systemic disease. After reviewing the risks  and benefits, the patient was deemed in                            satisfactory condition to undergo the procedure.                           After obtaining informed consent, the endoscope was                            passed under direct vision. Throughout the                            procedure, the patient's blood pressure,  pulse, and                            oxygen saturations were monitored continuously. The                            GIF-H190 ([id number]) Olympus gastroscope was                            introduced through the mouth, and advanced to the                            second part of duodenum. The upper GI endoscopy was                            accomplished without difficulty. The patient                            tolerated the procedure well. Scope In: Scope Out: Findings:      Patchy, white plaques were found in the mid esophagus and in the distal       esophagus. Biopsies were taken with a cold forceps for histology.      The Z-line was regular and was found 43 cm from the incisors.      A medium amount of food (residue) was found in the gastric fundus and in       the gastric body.      Scattered mild inflammation characterized by congestion (edema),       erosions, erythema and friability was found in the entire examined       stomach.      One non-bleeding superficial gastric ulcer with no stigmata of bleeding       was found in the gastric antrum. The lesion was 4 mm in largest       dimension. Biopsies were taken with a cold forceps for histology.      Multiple small sessile polyps were found in the gastric body. Biopsies       were taken with a cold forceps for histology.      Food (residue) was found in the duodenal bulb.      The first portion of the duodenum and second portion of the duodenum       were normal. Impression:               - Esophageal plaques were found, suspicious for  candidiasis. Biopsied.                           - Z-line regular, 43 cm from the incisors.                           - A medium amount of food (residue) in the stomach.                           - Gastritis.                           - Non-bleeding gastric ulcer with no stigmata of                            bleeding. Biopsied.                           - Multiple  gastric polyps. Biopsied.                           - Retained food in the duodenum.                           - Normal first portion of the duodenum and second                            portion of the duodenum. Moderate Sedation:      Moderate (conscious) sedation was personally administered by an       anesthesia professional. The following parameters were monitored: oxygen       saturation, heart rate, blood pressure, and response to care. Recommendation:           - Return patient to hospital ward for ongoing care.                           - Clear liquid diet.                           - Continue present medications.                           - Await pathology results.                           - Perform a colonoscopy tomorrow. Procedure Code(s):        --- Professional ---                           [phone], Esophagogastroduodenoscopy, flexible,                            transoral; with biopsy, single or multiple Diagnosis Code(s):        --- Professional ---                           K22.9, Disease of esophagus, unspecified  K29.70, Gastritis, unspecified, without bleeding                           K25.9, Gastric ulcer, unspecified as acute or                            chronic, without hemorrhage or perforation                           K31.7, Polyp of stomach and duodenum                           D50.9, Iron deficiency anemia, unspecified                           R19.5, Other fecal abnormalities CPT copyright 2019 American Medical Association. All rights reserved. The codes documented in this report are preliminary and upon coder review may  be revised to meet current compliance requirements. Otis Brace, MD Otis Brace, MD 11/11/2020 11:29:23 AM Number of Addenda: 0

## 2020-11-11 NOTE — TOC Initial Note (Signed)
Transition of Care [facility]) - Initial/Assessment Note    Patient Details  Name: Gary Young MRN: [id number] Date of Birth: 01-30-1951  [facility] CM/SW Contact:    Lynnell Catalan, RN Phone Number: 11/11/2020, 1:11 PM  Clinical Narrative:                 Spoke with wife at bedside while pt in procedure. Physical therapy recommendations gone over with wife. Home Health choice given and [facility] chosen. [facility] liaison contacted for referral. Will need HHPT orders at dc. Wife states that pt has a RW and does not want a 3 in 1.  Expected Discharge Plan: [place] Barriers to Discharge: Continued Medical Work up   Patient Goals and CMS Choice Patient states their goals for this hospitalization and ongoing recovery are:: Wife states to get home CMS Medicare.gov Compare Post Acute Care list provided to:: Patient Choice offered to / list presented to : Patient  Expected Discharge Plan and Services Expected Discharge Plan: [place]   Discharge Planning Services: CM Consult Post Acute Care Choice: [place] arrangements for the past 2 months: Single Family Home                           HH Arranged: PT [place]: [place] Date [place]: 11/11/20 Time HH Agency Contacted: 69 Representative spoke with at [place]: Tommi Rumps  Prior Living Arrangements/Services Living arrangements for the past 2 months: [place] with:: Spouse Patient language and need for interpreter reviewed:: Yes Do you feel safe going back to the place where you live?: Yes      Need for Family Participation in Patient Care: Yes (Comment) Care giver support system in place?: Yes (comment)   Criminal Activity/Legal Involvement Pertinent to Current Situation/Hospitalization: No - Comment as needed  Activities of Daily Living Home Assistive Devices/Equipment: Eyeglasses ADL Screening (condition at time of  admission) Patient's cognitive ability adequate to safely complete daily activities?: Yes Is the patient deaf or have difficulty hearing?: No Does the patient have difficulty seeing, even when wearing glasses/contacts?: No Does the patient have difficulty concentrating, remembering, or making decisions?: No Patient able to express need for assistance with ADLs?: Yes Does the patient have difficulty dressing or bathing?: No Independently performs ADLs?: No Communication: Independent Dressing (OT): Independent Grooming: Independent Feeding: Independent Bathing: Independent Toileting: Needs assistance Is this a change from baseline?: Pre-admission baseline In/Out Bed: Needs assistance Is this a change from baseline?: Pre-admission baseline Walks in Home: Needs assistance Is this a change from baseline?: Pre-admission baseline Does the patient have difficulty walking or climbing stairs?: Yes Weakness of Legs: Both Weakness of Arms/Hands: Both  Permission Sought/Granted Permission sought to share information with : Chartered certified accountant granted to share information with : Yes, Verbal Permission Granted     Permission granted to share info w AGENCY: [facility]        Emotional Assessment Appearance:: Appears stated age     Orientation: : Oriented to Self,Oriented to Place,Oriented to  Time,Oriented to Situation Alcohol / Substance Use: Not Applicable    Admission diagnosis:  Nodule of neck [R22.1] Intractable pain [R52] 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 Brain metastases ([place]) 11/10/2020  . Bone metastasis ([place]) 11/10/2020  . Liver masses   . Mass in neck   .  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 Malignant melanoma ([place])   . Goals of care, counseling/discussion   . Palliative care by specialist   . Intractable pain 11/04/2020   PCP:  [name], MD Pharmacy:   [facility]  [place], [place] - [address] [place] [address] [place] [address] [place] [address] Phone: [phone] Fax: [phone]     Social Determinants of Health (SDOH) Interventions    Readmission Risk Interventions Readmission Risk Prevention Plan 11/11/2020  Transportation Screening Complete  PCP or Specialist Appt within 3-5 Days Complete  HRI or [place] Complete  Social Work Consult for [place] Planning/Counseling Complete  Palliative Care Screening Not Applicable  Medication Review Press photographer) Complete  Some recent data might be hidden

## 2020-11-11 NOTE — Progress Notes (Signed)
Patient is still admitted to the hospital, but has been able to continue workup. He had a biopsy on 11/05/20. This returned today positive for metastatic melanoma. Per Dr Antonieta Pert request BRAF testing requested on [id number]. Spoke to [place] in pathology.  Patient is positive for brain mets. RadOnc consulted and plan for radiation made.   Will follow for after discharge needs and initiation of systemic therapy.   Oncology Nurse Navigator Documentation  Oncology Nurse Navigator Flowsheets 11/11/2020  Abnormal Finding Date -  Confirmed Diagnosis Date 11/05/2020  Diagnosis Status Confirmed Diagnosis Complete  Phase of Treatment Radiation  Radiation Actual Start Date: 11/11/2020  Navigator Follow Up Date: 11/13/2020  Navigator Follow Up Reason: Appointment Review  Navigator Location [facility]  Referral Date to RadOnc/MedOnc -  Navigator Encounter Type Appt/Treatment Plan Review;Pathology Review  Telephone -  Treatment Initiated Date 11/11/2020  Patient Visit Type MedOnc  Treatment Phase First Radiation Tx  Barriers/Navigation Needs Coordination of Care;Education  Education -  Interventions Coordination of Care  Acuity Level 2-Minimal Needs (1-2 Barriers Identified)  Coordination of Care Pathology  Education Method -  Support Groups/Services Friends and Family  Time Spent with Patient 30

## 2020-11-11 NOTE — Interval H&P Note (Signed)
History and Physical Interval Note:  11/11/2020 11:06 AM  Gary Young  has presented today for surgery, with the diagnosis of anemia, heme positive stool.  The various methods of treatment have been discussed with the patient and family. After consideration of risks, benefits and other options for treatment, the patient has consented to  Procedure(s): ESOPHAGOGASTRODUODENOSCOPY (EGD) (N/A) as a surgical intervention.  The patient's history has been reviewed, patient examined, no change in status, stable for surgery.  I have reviewed the patient's chart and labs.  Questions were answered to the patient's satisfaction.     Dinesh Ulysse

## 2020-11-11 NOTE — Anesthesia Postprocedure Evaluation (Signed)
Anesthesia Post Note  Patient: Gary Young  Procedure(s) Performed: ESOPHAGOGASTRODUODENOSCOPY (EGD) (N/A ) BIOPSY     Patient location during evaluation: PACU Anesthesia Type: MAC Level of consciousness: awake and alert Pain management: pain level controlled Vital Signs Assessment: post-procedure vital signs reviewed and stable Respiratory status: spontaneous breathing and respiratory function stable Cardiovascular status: stable Postop Assessment: no apparent nausea or vomiting Anesthetic complications: no   No complications documented.  Last Vitals:  Vitals:   11/11/20 1147 11/11/20 1151  BP: 100/62 (!) 92/59  Pulse: 89 88  Resp: 19 17  Temp:    SpO2: 97% 98%    Last Pain:  Vitals:   11/11/20 1151  TempSrc:   PainSc: 5                  Gary Young DANIEL

## 2020-11-11 NOTE — Brief Op Note (Signed)
11/04/2020 - 11/11/2020  2:16 PM  PATIENT:  [name]  70 y.o. male  PRE-OPERATIVE DIAGNOSIS:  anemia, heme positive stool  POST-OPERATIVE DIAGNOSIS:  esophagitis, candida, gastric ulcer (non-bleeding), gastric polyp, biopsies  PROCEDURE:  Procedure(s): ESOPHAGOGASTRODUODENOSCOPY (EGD) (N/A) BIOPSY  SURGEON:  Surgeon(s) and Role:    * Milina Pagett, MD - Primary  Findings ---------- -EGD showed possible Candida esophagitis, gastritis and small antral ulcer.  Retained food in the stomach and duodenum limiting visualization.   Recommendations ------------------------ -Follow biopsy results. -Discussed with patient and patient's wife.  They would like to wait for biopsy results before deciding on colonoscopy. -Okay to start full liquid diet today -Continue twice daily PPI -Add nystatin swish and swallow -GI will follow  Otis Brace MD, FACP 11/11/2020, 2:17 PM  Contact #  [phone]

## 2020-11-12 ENCOUNTER — Ambulatory Visit
Admit: 2020-11-12 | Discharge: 2020-11-12 | Disposition: A | Discharge status: Home / Self Care | Payer: Medicare HMO | Attending: Radiation Oncology | Admitting: Radiation Oncology

## 2020-11-12 ENCOUNTER — Encounter (HOSPITAL_COMMUNITY): Payer: Self-pay | Admitting: Gastroenterology

## 2020-11-12 DIAGNOSIS — C438 Malignant melanoma of overlapping sites of skin: Secondary | ICD-10-CM | POA: Diagnosis not present

## 2020-11-12 DIAGNOSIS — C439 Malignant melanoma of skin, unspecified: Secondary | ICD-10-CM | POA: Diagnosis not present

## 2020-11-12 LAB — CBC WITH DIFFERENTIAL/PLATELET
Abs Immature Granulocytes: 0.38 10*3/uL — ABNORMAL HIGH (ref 0.00–0.07)
Basophils Absolute: 0 10*3/uL (ref 0.0–0.1)
Basophils Relative: 0 %
Eosinophils Absolute: 0 10*3/uL (ref 0.0–0.5)
Eosinophils Relative: 0 %
HCT: 33.2 % — ABNORMAL LOW (ref 39.0–52.0)
Hemoglobin: 10 g/dL — ABNORMAL LOW (ref 13.0–17.0)
Immature Granulocytes: 2 %
Lymphocytes Relative: 4 %
Lymphs Abs: 0.7 10*3/uL (ref 0.7–4.0)
MCH: 25.5 pg — ABNORMAL LOW (ref 26.0–34.0)
MCHC: 30.1 g/dL (ref 30.0–36.0)
MCV: 84.7 fL (ref 80.0–100.0)
Monocytes Absolute: 2 10*3/uL — ABNORMAL HIGH (ref 0.1–1.0)
Monocytes Relative: 10 %
Neutro Abs: 17.4 10*3/uL — ABNORMAL HIGH (ref 1.7–7.7)
Neutrophils Relative %: 84 %
Platelets: 279 10*3/uL (ref 150–400)
RBC: 3.92 MIL/uL — ABNORMAL LOW (ref 4.22–5.81)
RDW: 17.9 % — ABNORMAL HIGH (ref 11.5–15.5)
WBC: 20.5 10*3/uL — ABNORMAL HIGH (ref 4.0–10.5)
nRBC: 0 % (ref 0.0–0.2)

## 2020-11-12 LAB — COMPREHENSIVE METABOLIC PANEL
ALT: 43 U/L (ref 0–44)
AST: 37 U/L (ref 15–41)
Albumin: 2.6 g/dL — ABNORMAL LOW (ref 3.5–5.0)
Alkaline Phosphatase: 114 U/L (ref 38–126)
Anion gap: 8 (ref 5–15)
BUN: 28 mg/dL — ABNORMAL HIGH (ref 8–23)
CO2: 28 mmol/L (ref 22–32)
Calcium: 7.8 mg/dL — ABNORMAL LOW (ref 8.9–10.3)
Chloride: 96 mmol/L — ABNORMAL LOW (ref 98–111)
Creatinine, Ser: 0.61 mg/dL (ref 0.61–1.24)
GFR, Estimated: >60 mL/min (ref >60)
Glucose, Bld: 142 mg/dL — ABNORMAL HIGH (ref 70–99)
Potassium: 4.9 mmol/L (ref 3.5–5.1)
Sodium: 132 mmol/L — ABNORMAL LOW (ref 135–145)
Total Bilirubin: 0.4 mg/dL (ref 0.3–1.2)
Total Protein: 5.5 g/dL — ABNORMAL LOW (ref 6.5–8.1)

## 2020-11-12 NOTE — Progress Notes (Signed)
Daily Progress Note   Patient Name: Gary Young       Date: 11/12/2020 DOB: May 10, 1951  Age: 70 y.o. MRN#: [id number] Attending Physician: Oren Binet* Primary Care Physician: [name], MD Admit Date: 11/04/2020  Reason for Consultation/Follow-up: Establishing goals of care and Pain control  Subjective: Medical records reviewed. Assessed patient at the bedside and met with wife [name] at the bedside. [name] is sitting up in the chair, expressing fatigue and desire to return to bed. Reports 3/10 pain and feeling gassy. Denies anxiety.  [name] and [name] feel that he tolerated radiation well yesterday with no concerning side effects thus far. They are still hopeful that he will be able to continue radiation treatments from home. They are also glad that the biopsy results are ready and they can begin discussing options for systemic treatment. [name] is willing to proceed with colonoscopy if recommended, although he would prefer to avoid this if possible.   Questions and concerns addressed. PMT will continue to support holistically.   Length of Stay: 7  Current Medications: Scheduled Meds:  . Chlorhexidine Gluconate Cloth  6 each Topical Daily  . cholecalciferol  2,000 Units Oral Daily  . doxazosin  8 mg Oral QPM  . enoxaparin (LOVENOX) injection  40 mg Subcutaneous Q24H  . feeding supplement  237 mL Oral BID BM  .  HYDROmorphone (DILAUDID) injection  1 mg Intravenous Once  . latanoprost  1 drop Left Eye QHS  . metoprolol tartrate  25 mg Oral BID  . multivitamin with minerals  1 tablet Oral Daily  . nystatin  5 mL Oral QID  . oxyCODONE  20 mg Oral Q12H  . pantoprazole  40 mg Oral BID AC  . polyethylene glycol  17 g Oral Daily  . senna-docusate  2 tablet Oral BID  . temazepam   15 mg Oral QHS  . timolol  1 drop Both Eyes Daily  . cyanocobalamin  1,000 mcg Oral Daily    Continuous Infusions: . dexamethasone (DECADRON) IVPB ([facility]) 20 mg (11/12/20 0816)  . lactated ringers Stopped (11/11/20 1705)    PRN Meds: acetaminophen, hydrOXYzine, labetalol, metoprolol tartrate, oxyCODONE, simethicone  Physical Exam Vitals and nursing note reviewed.  Constitutional:      Interventions: Nasal cannula in place.     Comments: 2L  Cardiovascular:  Rate and Rhythm: Normal rate.  Pulmonary:     Effort: Pulmonary effort is normal.  Skin:    General: Skin is warm and dry.  Neurological:     Mental Status: He is alert and oriented to person, place, and time.  Psychiatric:        Behavior: Behavior is cooperative.             Vital Signs: BP 136/85   Pulse 82   Temp 98 F (36.7 C) (Oral)   Resp 18   Ht 6' (1.829 m)   Wt 105.4 kg   SpO2 99%   BMI 31.51 kg/m  SpO2: SpO2: 99 % O2 Device: O2 Device: Room Air O2 Flow Rate: O2 Flow Rate (L/min): 2 L/min  Intake/output summary:   Intake/Output Summary (Last 24 hours) at 11/12/2020 1315 Last data filed at 11/12/2020 1007 Gross per 24 hour  Intake 240 ml  Output 450 ml  Net -210 ml   LBM: Last BM Date: 11/10/20 Baseline Weight: Weight: 105.7 kg Most recent weight: Weight: 105.4 kg       Palliative Assessment/Data: 50%     Patient Active Problem List   Diagnosis Date Noted  . Brain metastases ([place]) 11/10/2020  . Bone metastasis ([place]) 11/10/2020  . Liver masses   . Mass in neck   . 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 Malignant melanoma ([place])   . Goals of care, counseling/discussion   . Palliative care by specialist   . Intractable pain 11/04/2020    Palliative Care Assessment & Plan   Patient Profile: 70 y.o. male  with past medical history of melanoma s/p excision, iron deficiency anemia, and moderate protein calorie malnutrition admitted on 11/04/2020 with complaints of weakness and uncontrolled  pain.   He was recently found to have metastatic diseaseon CT scan at outside facilityinvolving his lungs, liver, and bones with unknown primary. Referred to Dr. Marin Olp and prescribed fentanyl patched and tramadol, without relief.   Assessment: Metastatic melanoma Cancer-associated pain, well-controlled  Recommendations/Plan: Continue pain management regimen per MAR Goal remains to return home with [facility] and outpatient palliative care, continuing interventions  Psychosocial and emotional support provided  Goals of Care and Additional Recommendations: Limitations on Scope of Treatment: Full Scope Treatment  Code Status: DNR/DNI   Code Status Orders  (From admission, onward)         Start     Ordered   11/04/20 1830  Do not attempt resuscitation (DNR)  Continuous       Question Answer Comment  In the event of cardiac or respiratory ARREST Do not call a "code blue"   In the event of cardiac or respiratory ARREST Do not perform Intubation, CPR, defibrillation or ACLS   In the event of cardiac or respiratory ARREST Use medication by any route, position, wound care, and other measures to relive pain and suffering. May use oxygen, suction and manual treatment of airway obstruction as needed for comfort.      11/04/20 1829        Code Status History    Date Active Date Inactive Code Status Order ID Comments User Context   11/04/2020 1728 11/04/2020 1829 Full Code [id number]  Kayleen Memos DO ED   Advance Care Planning Activity    Advance Directive Documentation   Flowsheet Row Most Recent Value  Type of Advance Directive Living will, Healthcare Power of Attorney  Pre-existing out of facility DNR order (yellow form or pink MOST form) --  "  MOST" Form in Place? --      Prognosis:  Poor long-term prognosis given widely metastatic cancer  Discharge Planning: Home with Home Health and outpatient palliative care referral  Care plan was discussed with patient, patient's wife  [name]  Total time: 15 minutes Greater than 50% of this time was spent in counseling and coordinating care related to the above assessment and plan.  Dorthy Cooler, PA-C Palliative Medicine Team Team phone # [phone]  Thank you for allowing the Palliative Medicine Team to assist in the care of this patient. Please utilize secure chat with additional questions, if there is no response within 30 minutes please call the above phone number.  Palliative Medicine Team providers are available by phone from 7am to 7pm daily and can be reached through the team cell phone.  Should this patient require assistance outside of these hours, please call the patient's attending physician.

## 2020-11-12 NOTE — Progress Notes (Signed)
Nutrition Follow-up  DOCUMENTATION CODES:   Obesity unspecified  INTERVENTION:   -Ensure Enlive po BID, each supplement provides 350 kcal and 20 grams of protein  -Multivitamin with minerals daily  NUTRITION DIAGNOSIS:   Increased nutrient needs related to cancer and cancer related treatments as evidenced by estimated needs.  Ongoing.  GOAL:   Patient will meet greater than or equal to 90% of their needs    MONITOR:   PO intake,Supplement acceptance,Labs,Weight trends,I & O's  REASON FOR ASSESSMENT:   Consult,Malnutrition Screening Tool Assessment of nutrition requirement/status  ASSESSMENT:   70 y.o. male with medical history significant for melanoma diagnosed 8 years ago and excised, seasonal allergies, recently diagnosed iron deficiency anemia, who was recently found to have metastatic disease on CT scan at outside facility involving his lungs, liver, and bones with unknown primary.  6/1: s/p EGD- gastritis  Patient starting radiation today. GI recommending colonoscopy as well.  Currently consuming 50-80% of full liquid meals. When on regular/soft diet was consuming 100% of meals.  Is drinking Ensure that is provided.  Admission weight: 232 lbs. No other weights for this admission.  Medications: Vitamin D, Multivitamin with minerals daily, Miralax, Senokot, Vitamin B-12  Labs reviewed: Low Na  Diet Order:   Diet Order            Diet full liquid Room service appropriate? Yes; Fluid consistency: Thin  Diet effective now                 EDUCATION NEEDS:   No education needs have been identified at this time  Skin:  Skin Assessment: Reviewed RN Assessment  Last BM:  5/31 -type 2&3  Height:   Ht Readings from Last 1 Encounters:  11/04/20 6' (1.829 m)    Weight:   Wt Readings from Last 1 Encounters:  11/05/20 105.4 kg   BMI:  Body mass index is 31.51 kg/m.  Estimated Nutritional Needs:   Kcal:  2100-2300  Protein:   115-130g  Fluid:  2.1L/day  Clayton Bibles, MS, RD, LDN Inpatient Clinical Dietitian Contact information available via Amion

## 2020-11-12 NOTE — Progress Notes (Signed)
Occupational Therapy Treatment Patient Details Name: Gary Young MRN: [id number] DOB: 1950/08/14 Today's Date: 11/12/2020    History of present illness Pt is 70 yo male w/ h/o melanoma 8 years ago that was excised.  Pt was found to have extensive metastatic CA on 10/24/20.   Pt admitted on 11/04/20 with widely metastatic CA, intractable bone and joint pain and hyponatremia.  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 Per oncology note likely pathological fx of clavicle on R.   Pt has been started on decadron.   OT comments  With encouragement from spouse patient agreeable to sink side ADLs. Needing multimodal cues for safe hand placement during sit to stand and repeated cues for multi-step directions to brush teeth and wash face prior to sitting in chair as patient trying to sit down in recliner first. Patient needing min cue to sequence brushing teeth and then asked to sit down to wash face. Patient needing cue for safety as he attempts to sit quickly and on O2 tubing.    Follow Up Recommendations  Home health OT    Equipment Recommendations  None recommended by OT       Precautions / Restrictions Precautions Precautions: Fall Precaution Comments: started radiation 6/1-likes to sleep after Restrictions Weight Bearing Restrictions: No       Mobility Bed Mobility               General bed mobility comments: seated EOB    Transfers Overall transfer level: Needs assistance Equipment used: Rolling walker (2 wheeled) Transfers: Sit to/from Stand Sit to Stand: Min guard         General transfer comment: needing multimodal cues for hand placement    Balance Overall balance assessment: Needs assistance Sitting-balance support: Feet supported Sitting balance-Leahy Scale: Good     Standing balance support: Single extremity supported Standing balance-Leahy Scale: Poor                              ADL either performed or assessed with clinical judgement   ADL Overall ADL's : Needs assistance/impaired     Grooming: Supervision/safety;Oral care;Wash/dry face;Sitting;Standing Grooming Details (indicate cue type and reason): limited standing tolerance to brush teeth while leaning heavily onto sink, ask to sit to wash his face asking for "biodegradable" brown stuff to put on wash cloth, ultimately was asking for CHG wipes                 Toilet Transfer: Min guard;Ambulation;RW;Cueing for safety Toilet Transfer Details (indicate cue type and reason): to recliner chair, needing cues for safety to reach back for chair and to watch O2 tubing almost sat on it         Functional mobility during ADLs: Min guard;Rolling walker;Cueing for safety                 Cognition Arousal/Alertness: Awake/alert Behavior During Therapy: WFL for tasks assessed/performed Overall Cognitive Status: Impaired/Different from baseline Area of Impairment: Memory;Following commands;Safety/judgement                     Memory: Decreased short-term memory Following Commands: Follows multi-step commands inconsistently Safety/Judgement: Decreased awareness of safety     General Comments: patient forgetting instructions OT presented patient with during g/h task, sitting unsafely into chair with O2 tubing  Pertinent Vitals/ Pain       Pain Assessment: Faces Faces Pain Scale: No hurt         Frequency  Min 2X/week        Progress Toward Goals  OT Goals(current goals can now be found in the care plan section)  Progress towards OT goals: Progressing toward goals  Acute Rehab OT Goals Patient Stated Goal: return home OT Goal Formulation: With patient Time For Goal Achievement: 11/21/20 Potential to Achieve Goals: Good ADL Goals Pt Will Perform Grooming: with supervision;standing Pt Will Perform Lower Body Bathing: with supervision;sit to/from  stand Pt Will Perform Lower Body Dressing: with supervision;sit to/from stand Pt Will Transfer to Toilet: with supervision;ambulating;regular height toilet Pt Will Perform Toileting - Clothing Manipulation and hygiene: with supervision;sit to/from stand Additional ADL Goal #1: Pt will state at least 3 fall prevention strategies as instructed.  Plan Discharge plan remains appropriate       AM-PAC OT "6 Clicks" Daily Activity     Outcome Measure   Help from another person eating meals?: None Help from another person taking care of personal grooming?: A Little Help from another person toileting, which includes using toliet, bedpan, or urinal?: A Little Help from another person bathing (including washing, rinsing, drying)?: A Little Help from another person to put on and taking off regular upper body clothing?: A Little Help from another person to put on and taking off regular lower body clothing?: A Little 6 Click Score: 19    End of Session Equipment Utilized During Treatment: Rolling walker;Oxygen  OT Visit Diagnosis: Unsteadiness on feet (R26.81);Other abnormalities of gait and mobility (R26.89)   Activity Tolerance Patient tolerated treatment well   Patient Left in chair;with call bell/phone within reach;with family/visitor present   Nurse Communication Mobility status        Time: 5638-9373 OT Time Calculation (min): 25 min  Charges: OT General Charges $OT Visit: 1 Visit OT Treatments $Self Care/Home Management : 23-37 mins  Delbert Phenix OT OT pager: [place] 11/12/2020, 9:05 AM

## 2020-11-12 NOTE — Progress Notes (Signed)
Physical Therapy Treatment Patient Details Name: Gary Young MRN: [id number] DOB: 03/20/1951 Today's Date: 11/12/2020    History of Present Illness Pt is 70 yo male w/ h/o melanoma 8 years ago that was excised.  Pt was found to have extensive metastatic CA on 10/24/20.   Pt admitted on 11/04/20 with widely metastatic CA, intractable bone and joint pain and hyponatremia.  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 Per oncology note likely pathological fx of clavicle on R.   Pt has been started on decadron.    PT Comments    Pt agreeable to ambulate and then wished to return to bed.  Pt's Spo2 above 95% on room air during session, so left off O2 [place] and RN notified.  Pt fatigues quickly.  Continue to recommend 24/7 assist at home for safety and HHPT.   Follow Up Recommendations  Home health PT;Supervision/Assistance - 24 hour     Equipment Recommendations  None recommended by PT    Recommendations for Other Services       Precautions / Restrictions Precautions Precautions: Fall Precaution Comments: started radiation 6/1-likes to sleep after    Mobility  Bed Mobility Overal bed mobility: Needs Assistance Bed Mobility: Sit to Supine       Sit to supine: Min assist   General bed mobility comments: light assist for LEs onto bed    Transfers Overall transfer level: Needs assistance Equipment used: Rolling walker (2 wheeled) Transfers: Sit to/from Stand Sit to Stand: Min guard         General transfer comment: verbal cues for hand placement and controlling descent  Ambulation/Gait Ambulation/Gait assistance: Min guard Gait Distance (Feet): 80 Feet Assistive device: Rolling walker (2 wheeled) Gait Pattern/deviations: Step-through pattern;Decreased stride length     General Gait Details: poor eccentric control of left LE however spouse reports gait appears improved since previous session, Spo2 96% on room air  during ambulation (left pt off O2 [place] and RN aware)   Stairs             Wheelchair Mobility    Modified Rankin (Stroke Patients Only)       Balance                                            Cognition Arousal/Alertness: Awake/alert Behavior During Therapy: WFL for tasks assessed/performed Overall Cognitive Status: Impaired/Different from baseline                           Safety/Judgement: Decreased awareness of safety     General Comments: multiple cues for line safety however pt forgetting      Exercises      General Comments        Pertinent Vitals/Pain Pain Assessment: Faces Faces Pain Scale: Hurts little more Pain Location: left groin Pain Descriptors / Indicators: Sore;Discomfort Pain Intervention(s): Repositioned;Monitored during session    Home Living                      Prior Function            PT Goals (current goals can now be found in the care plan section) Progress towards PT goals: Progressing toward goals    Frequency    Min 3X/week  PT Plan Current plan remains appropriate    Co-evaluation              AM-PAC PT "6 Clicks" Mobility   Outcome Measure  Help needed turning from your back to your side while in a flat bed without using bedrails?: A Little Help needed moving from lying on your back to sitting on the side of a flat bed without using bedrails?: A Little Help needed moving to and from a bed to a chair (including a wheelchair)?: A Little Help needed standing up from a chair using your arms (e.g., wheelchair or bedside chair)?: A Little Help needed to walk in hospital room?: A Little Help needed climbing 3-5 steps with a railing? : A Lot 6 Click Score: 17    End of Session Equipment Utilized During Treatment: Gait belt Activity Tolerance: Patient tolerated treatment well Patient left: with call bell/phone within reach;with family/visitor present;in bed;with bed  alarm set   PT Visit Diagnosis: Unsteadiness on feet (R26.81);Muscle weakness (generalized) (M62.81)     Time: 0912-0930 PT Time Calculation (min) (ACUTE ONLY): 18 min  Charges:  $Gait Training: 8-22 mins                    Arlyce Dice, DPT Acute Rehabilitation Services Pager: [phone] Office: [phone]   [place] Ram E 11/12/2020, 12:50 PM

## 2020-11-12 NOTE — Progress Notes (Signed)
[facility] Progress Note  Brahm Barbeau 70 y.o. 1950-07-09  CC:  Anemia, heme-positive stool  Subjective: Denies abdominal pain, nausea, or vomiting. Had a BM this morning.  ROS : Review of Systems  Cardiovascular: Negative for chest pain and palpitations.  Gastrointestinal: Negative for abdominal pain, blood in stool, constipation, diarrhea, heartburn, melena, nausea and vomiting.   Objective: Vital signs in last 24 hours: Vitals:   11/12/20 0521 11/12/20 0945  BP: 135/89 136/85  Pulse: 75 82  Resp: 18   Temp: 98 F (36.7 C)   SpO2: 99%     Physical Exam:  General:  Laying in bed in no acute distress, cooperative  Head:  Normocephalic, without obvious abnormality, atraumatic  Eyes:  Anicteric sclera, EOMs intact   Lungs:   Clear to auscultation bilaterally, respirations unlabored  Heart:  Regular rate and rhythm, S1, S2 normal  Abdomen:   Soft, non-tender, non-distended, normoactive bowel sounds  Extremities: Extremities normal, atraumatic, no  edema    Lab Results: Recent Labs    11/10/20 0619 11/11/20 0423 11/12/20 0556  NA 136  --  132*  K 4.7  --  4.9  CL 99  --  96*  CO2 31  --  28  GLUCOSE 135*  --  142*  BUN 34*  --  28*  CREATININE 0.74 0.63 0.61  CALCIUM 8.1*  --  7.8*   Recent Labs    11/12/20 0556  AST 37  ALT 43  ALKPHOS 114  BILITOT 0.4  PROT 5.5*  ALBUMIN 2.6*   Recent Labs    11/11/20 0423 11/12/20 0556  WBC 20.2* 20.5*  NEUTROABS 17.0* 17.4*  HGB 9.9* 10.0*  HCT 32.1* 33.2*  MCV 84.7 84.7  PLT 264 279   No results for input(s): LABPROT, INR in the last 72 hours.    Assessment: Anemia, heme-positive stool. 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  No abnormal findings of stomach/bowel on recent CT. -EGD 6/1 revealed esophageal plaques were found, suspicious for candidiasis, gastritis, non-bleeding gastric ulcer with no stigmata of bleeding, preliminary biopsy revealed metastatic melanoma.  Multiple gastric polyps.  - Normal first portion of the duodenum and second portion of the duodenum. 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 Marin Olp).  Dr. Alessandra Bevels discussed case with Dr. Marin Olp.  Unlikely colonoscopy will change management, as melanoma was detected in upper GI tract, thus no plan for colonoscopy at this time.  [facility] will sign off. Please contact us if we can be of any further assistance during this hospital stay.   Salley Slaughter PA-C 11/12/2020, 11:39 AM  Contact #  [phone]

## 2020-11-12 NOTE — Progress Notes (Signed)
We do have back the biopsy result.  To no surprise, this is melanoma.  Everything certainly pointed to melanoma when we first saw Mr. Ortner.  We will have to await the BRAF analysis as this will help dictate our systemic therapy.  I think he will start radiation therapy today.  He did have an upper endoscopy yesterday.  Results are noted.  There are no obvious melanoma lesions.  I would certainly entertain doing a colonoscopy on him to have a complete picture of his viewable GI tract.  He seems to be doing fairly well with respect to pain.  His labs show white cell count 20.5.  Hemoglobin 10.  Platelet 279,000.  The potassium is 4.9.  BUN is 28 creatinine 0.61.  Calcium 7.8 with an albumin of 2.6.  It is hard to say how much she is eating.  There is certainly is no nausea or vomiting.  There is no cough.  He has had no obvious bleeding.  There has been no fevers.  His vital signs show temperature 98.  Pulse 75.  Blood pressure 135/89.  Overall, there is no change in his physical exam.  We now have a diagnosis of metastatic melanoma for Mr. Elman.  We are not surprised by this.  He will start his radiation therapy.  Again, I think the real question is whether or not he can do radiation as an outpatient.  I think this would be incredibly challenging to do at the present time.  I do think that a colonoscopy would not be a bad idea.  The BRAF analysis will be critical as this will dictate how he will be treated systemically.  I know that the staff on [address] have done a fantastic job with him.  I appreciate all their efforts.   Lattie Haw, MD  Hebrews 12:7

## 2020-11-12 NOTE — Plan of Care (Signed)
Pt had one episode of confusion where he dropped the urinal and urinated on the floor; pt easily reoriented. Pt reports no acute pain just discomfort well managed with Rx. No s/s of acute distress observed or reported; pt has call light within reach and bed at lowest position for safety. Resting now comfortably.

## 2020-11-12 NOTE — Progress Notes (Signed)
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 Marin Olp was a direct admit on 11/04/2020 for management of bilateral hand pain.  Patient was initially treated with a morphine PCA and eventually transition to OxyContin and oxycodone.  He underwent a Port-A-Cath placement.  Repeat work-up showed diffuse extensive metastases on chest CT and MRI brain showed multiple brain mets as well.  Patient was started on IV Decadron and radiation therapy.  Patient underwent endoscopy today for IDA and guaiac positive stools.  As per earlier GI note, biopsy shows melanoma in GI tract as well.  Patient is presently in house primarily because he is likely too weak to tolerate XRT at home.   Subjective:  His main concern is "gas" in his stomach when he is lying flat for his radiation therapy.  Notes getting simethicone yesterday was helpful.  Otherwise his pain is reasonably controlled.  He admits to being very weak.  No new other acute concerns.  Wife was at bedside throughout and she also noted no further acute concerns.   Objective: Vitals:   11/11/20 1700 11/11/20 2215 11/12/20 0521 11/12/20 0945  BP: 130/82 127/83 135/89 136/85  Pulse:  83 75 82  Resp:  18 18   Temp:  97.8 F (36.6 C) 98 F (36.7 C)   TempSrc:  Oral Oral   SpO2:  95% 99%   Weight:      Height:        Intake/Output Summary (Last 24 hours) at 11/12/2020 1233 Last data filed at 11/12/2020 1007 Gross per 24 hour  Intake 240 ml  Output 450 ml  Net -210 ml   Filed Weights   11/04/20 1525 11/05/20 0136  Weight: 105.7 kg 105.4 kg     Exam:  General: Chronically ill looking gentleman looking much older than stated age lying in bed in no acute distress but looking unwell. Eyes: sclera anicteric, conjuctiva mild injection  bilaterally CVS: S1-S2, regular  Respiratory:  decreased air entry bilaterally secondary to decreased inspiratory effort, rales at bases  GI: NABS, soft, NT  LE: No edema.  Neuro: Patient with known brain mets, extensive neuro exam was not done. Psych: patient is logical and coherent, judgement and insight appear normal, mood and affect appropriate to situation.   Assessment & Plan:   70 year old man with widely metastatic melanoma is here for radiation therapy and pain management.  Metastatic melanoma Patient with metastases in his lungs, liver, retroperitoneum, right psoas muscle, GI tract, brain as well as lytic lesions in the thoracolumbar and bony pelvis. Pain is much improved on present course of treatment. Patient is undergoing XRT. Is followed closely by Dr. Marin Olp. Continue IV steroids, radiation therapy and further care per Dr. Marin Olp. Palliative care team is also following. Continue PT.  Gas pain Continue as needed simethicone  Right calf pain Lower extremity Dopplers ordered 11/11/2020 and are pending If positive, patient would likely need an IVC filter given brain mets.  IDA with heme positive stool Per GI note, patient has melanoma in his upper GI tract on gastric ulcer biopsy No further benefit from colonoscopy per their recommendations  Leukocytosis Increased since admission however stable over the last 4 days To be secondary to IV steroids and extensive cancer  Intractable pain Reasonably controlled on present regimen  DVT prophylaxis: Lovenox Code Status: DNR Family Communication: Wife was present throughout Disposition Plan:   Patient is from: Home  Anticipated Discharge Location: TBD  Barriers to Discharge: Still very weak, unsafe discharge  Is patient medically stable for Discharge: No   Consultants:  Oncology  Palliative care  IR  GI  Procedures:  Port-A-Cath placement 5/26  Subcutaneous nodule biopsy 5/26  EGD with gastric  ulcer biopsy 6/1    Data Reviewed:  Basic Metabolic Panel: Recent Labs  Lab 11/06/20 0557 11/07/20 0431 11/10/20 0619 11/11/20 0423 11/12/20 0556  NA 133* 137 136  --  132*  K 4.4 4.3 4.7  --  4.9  CL 93* 97* 99  --  96*  CO2 29 29 31   --  28  GLUCOSE 106* 203* 135*  --  142*  BUN 20 23 34*  --  28*  CREATININE 0.57* 0.52* 0.74 0.63 0.61  CALCIUM 9.3 9.3 8.1*  --  7.8*   Liver Function Tests: Recent Labs  Lab 11/06/20 0557 11/07/20 0431 11/12/20 0556  AST 26 28 37  ALT 23 26 43  ALKPHOS 102 105 114  BILITOT 0.7 0.4 0.4  PROT 5.3* 5.8* 5.5*  ALBUMIN 2.5* 2.7* 2.6*   No results for input(s): LIPASE, AMYLASE in the last 168 hours. No results for input(s): AMMONIA in the last 168 hours. CBC: Recent Labs  Lab 11/06/20 0557 11/07/20 0431 11/09/20 0754 11/10/20 0619 11/11/20 0423 11/12/20 0556  WBC 13.7* 16.8* 20.3* 19.1* 20.2* 20.5*  NEUTROABS 10.1* 14.9*  --  15.5* 17.0* 17.4*  HGB 8.8* 8.9* 9.9* 9.7* 9.9* 10.0*  HCT 28.9* 29.1* 33.3* 31.9* 32.1* 33.2*  MCV 84.5 83.4 85.4 85.1 84.7 84.7  PLT 359 352 342 352 264 279   Cardiac Enzymes: No results for input(s): CKTOTAL, CKMB, CKMBINDEX, TROPONINI in the last 168 hours. BNP (last 3 results) No results for input(s): PROBNP in the last 8760 hours. CBG: No results for input(s): GLUCAP in the last 168 hours.  Recent Results (from the past 240 hour(s))  Resp Panel by RT-PCR (Flu A&B, Covid) Nasopharyngeal Swab     Status: None   Collection Time: 11/04/20  4:44 PM   Specimen: Nasopharyngeal Swab; Nasopharyngeal(NP) swabs in vial transport medium  Result Value Ref Range Status   SARS Coronavirus 2 by RT PCR NEGATIVE NEGATIVE Final    Comment: (NOTE) SARS-CoV-2 target nucleic acids are NOT DETECTED.  The SARS-CoV-2 RNA is generally detectable in upper respiratory specimens during the acute phase of infection. The lowest concentration of SARS-CoV-2 viral copies this assay can detect is 138 copies/mL. A negative  result does not preclude SARS-Cov-2 infection and should not be used as the sole basis for treatment or other patient management decisions. A negative result may occur with  improper specimen collection/handling, submission of specimen other than nasopharyngeal swab, presence of viral mutation(s) within the areas targeted by this assay, and inadequate number of viral copies(<138 copies/mL). A negative result must be combined with clinical observations, patient history, and epidemiological information. The expected result is Negative.  Fact Sheet for Patients:  EntrepreneurPulse.com.au  Fact Sheet for Healthcare Providers:  IncredibleEmployment.be  This test is no t yet approved or cleared by the [place] FDA and  has been authorized for detection and/or diagnosis of SARS-CoV-2 by FDA under an Emergency Use Authorization (EUA). This EUA will remain  in effect (meaning this test can be used) for the duration of the COVID-19 declaration under Section 564(b)(1) of the Act,  21 U.S.C.section 360bbb-3(b)(1), unless the authorization is terminated  or revoked sooner.       Influenza A by PCR NEGATIVE NEGATIVE Final   Influenza B by PCR NEGATIVE NEGATIVE Final    Comment: (NOTE) The Xpert Xpress SARS-CoV-2/FLU/RSV plus assay is intended as an aid in the diagnosis of influenza from Nasopharyngeal swab specimens and should not be used as a sole basis for treatment. Nasal washings and aspirates are unacceptable for Xpert Xpress SARS-CoV-2/FLU/RSV testing.  Fact Sheet for Patients: EntrepreneurPulse.com.au  Fact Sheet for Healthcare Providers: IncredibleEmployment.be  This test is not yet approved or cleared by the [place] FDA and has been authorized for detection and/or diagnosis of SARS-CoV-2 by FDA under an Emergency Use Authorization (EUA). This EUA will remain in effect (meaning this test can be used)  for the duration of the COVID-19 declaration under Section 564(b)(1) of the Act, 21 U.S.C. section 360bbb-3(b)(1), unless the authorization is terminated or revoked.  Performed at [facility], [place] [address], [place], [place] [address]       Studies: VAS [place] LOWER EXTREMITY VENOUS (DVT)  Result Date: 11/11/2020  Lower Venous DVT Study Patient Name:  TARYLL REICHENBERGER  Date of Exam:   11/11/2020 Medical Rec #: [id number]      Accession #:    [id number] Date of Birth: 16-Feb-1951      Patient Gender: M Patient Age:   69Y Exam Location:  [facility] Procedure:      VAS [place] LOWER EXTREMITY VENOUS (DVT) Referring Phys: Owen --------------------------------------------------------------------------------  Indications: Edema.  Risk Factors: None identified. Limitations: Poor ultrasound/tissue interface. Comparison Study: No prior studies. Performing Technologist: Oliver Hum RVT  Examination Guidelines: A complete evaluation includes B-mode imaging, spectral Doppler, color Doppler, and power Doppler as needed of all accessible portions of each vessel. Bilateral testing is considered an integral part of a complete examination. Limited examinations for reoccurring indications may be performed as noted. The reflux portion of the exam is performed with the patient in reverse Trendelenburg.  +---------+---------------+---------+-----------+----------+--------------+ RIGHT    CompressibilityPhasicitySpontaneityPropertiesThrombus Aging +---------+---------------+---------+-----------+----------+--------------+ CFV      Full           Yes      Yes                                 +---------+---------------+---------+-----------+----------+--------------+ SFJ      Full                                                        +---------+---------------+---------+-----------+----------+--------------+ FV Prox  Full                                                         +---------+---------------+---------+-----------+----------+--------------+ FV Mid   Full                                                        +---------+---------------+---------+-----------+----------+--------------+  FV DistalFull                                                        +---------+---------------+---------+-----------+----------+--------------+ PFV      Full                                                        +---------+---------------+---------+-----------+----------+--------------+ POP      Full           Yes      Yes                                 +---------+---------------+---------+-----------+----------+--------------+ PTV      Full                                                        +---------+---------------+---------+-----------+----------+--------------+ PERO     Full                                                        +---------+---------------+---------+-----------+----------+--------------+   +---------+---------------+---------+-----------+----------+-------------------+ LEFT     CompressibilityPhasicitySpontaneityPropertiesThrombus Aging      +---------+---------------+---------+-----------+----------+-------------------+ CFV      Full           Yes      Yes                                      +---------+---------------+---------+-----------+----------+-------------------+ SFJ      Full                                                             +---------+---------------+---------+-----------+----------+-------------------+ FV Prox  Full                                                             +---------+---------------+---------+-----------+----------+-------------------+ FV Mid   Full                                                             +---------+---------------+---------+-----------+----------+-------------------+ FV DistalFull                                                              +---------+---------------+---------+-----------+----------+-------------------+  PFV      Full                                                             +---------+---------------+---------+-----------+----------+-------------------+ POP      Full           Yes      Yes                                      +---------+---------------+---------+-----------+----------+-------------------+ PTV      Full                                                             +---------+---------------+---------+-----------+----------+-------------------+ PERO                                                  Not well visualized +---------+---------------+---------+-----------+----------+-------------------+     Summary: RIGHT: - There is no evidence of deep vein thrombosis in the lower extremity.  - No cystic structure found in the popliteal fossa.  LEFT: - There is no evidence of deep vein thrombosis in the lower extremity. However, portions of this examination were limited- see technologist comments above.  - No cystic structure found in the popliteal fossa.  *See table(s) above for measurements and observations. Electronically signed by Harold Barban MD on 11/11/2020 at 10:18:14 PM.    Final      Scheduled Meds: . Chlorhexidine Gluconate Cloth  6 each Topical Daily  . cholecalciferol  2,000 Units Oral Daily  . doxazosin  8 mg Oral QPM  . enoxaparin (LOVENOX) injection  40 mg Subcutaneous Q24H  . feeding supplement  237 mL Oral BID BM  .  HYDROmorphone (DILAUDID) injection  1 mg Intravenous Once  . latanoprost  1 drop Left Eye QHS  . metoprolol tartrate  25 mg Oral BID  . multivitamin with minerals  1 tablet Oral Daily  . nystatin  5 mL Oral QID  . oxyCODONE  20 mg Oral Q12H  . pantoprazole  40 mg Oral BID AC  . polyethylene glycol  17 g Oral Daily  . senna-docusate  2 tablet Oral BID  . temazepam  15 mg Oral QHS  . timolol  1 drop Both Eyes Daily  .  cyanocobalamin  1,000 mcg Oral Daily   Continuous Infusions: . dexamethasone (DECADRON) IVPB ([facility]) 20 mg (11/12/20 0816)  . lactated ringers Stopped (11/11/20 1705)    Active Problems:   Intractable pain   Mass in neck   Pain from bone metastases (HCC)   Malignant melanoma ([place])   Goals of care, counseling/discussion   Palliative care by specialist   Liver masses     Vashti Hey, [facility]  If 7PM-7AM, please contact night-coverage [email or web address]   LOS: 7 days

## 2020-11-13 ENCOUNTER — Ambulatory Visit
Admit: 2020-11-13 | Discharge: 2020-11-13 | Disposition: A | Discharge status: Home / Self Care | Payer: Medicare HMO | Attending: Radiation Oncology | Admitting: Radiation Oncology

## 2020-11-13 DIAGNOSIS — C439 Malignant melanoma of skin, unspecified: Secondary | ICD-10-CM | POA: Diagnosis not present

## 2020-11-13 DIAGNOSIS — G893 Neoplasm related pain (acute) (chronic): Secondary | ICD-10-CM | POA: Diagnosis not present

## 2020-11-13 DIAGNOSIS — R160 Hepatomegaly, not elsewhere classified: Secondary | ICD-10-CM | POA: Diagnosis not present

## 2020-11-13 DIAGNOSIS — F419 Anxiety disorder, unspecified: Secondary | ICD-10-CM | POA: Diagnosis not present

## 2020-11-13 DIAGNOSIS — R52 Pain, unspecified: Secondary | ICD-10-CM | POA: Diagnosis not present

## 2020-11-13 LAB — COMPREHENSIVE METABOLIC PANEL
ALT: 42 U/L (ref 0–44)
AST: 30 U/L (ref 15–41)
Albumin: 2.6 g/dL — ABNORMAL LOW (ref 3.5–5.0)
Alkaline Phosphatase: 106 U/L (ref 38–126)
Anion gap: 7 (ref 5–15)
BUN: 27 mg/dL — ABNORMAL HIGH (ref 8–23)
CO2: 29 mmol/L (ref 22–32)
Calcium: 7.7 mg/dL — ABNORMAL LOW (ref 8.9–10.3)
Chloride: 95 mmol/L — ABNORMAL LOW (ref 98–111)
Creatinine, Ser: 0.5 mg/dL — ABNORMAL LOW (ref 0.61–1.24)
GFR, Estimated: >60 mL/min (ref >60)
Glucose, Bld: 128 mg/dL — ABNORMAL HIGH (ref 70–99)
Potassium: 4.7 mmol/L (ref 3.5–5.1)
Sodium: 131 mmol/L — ABNORMAL LOW (ref 135–145)
Total Bilirubin: 0.7 mg/dL (ref 0.3–1.2)
Total Protein: 5.3 g/dL — ABNORMAL LOW (ref 6.5–8.1)

## 2020-11-13 LAB — CBC WITH DIFFERENTIAL/PLATELET
Abs Immature Granulocytes: 0.32 10*3/uL — ABNORMAL HIGH (ref 0.00–0.07)
Basophils Absolute: 0 10*3/uL (ref 0.0–0.1)
Basophils Relative: 0 %
Eosinophils Absolute: 0 10*3/uL (ref 0.0–0.5)
Eosinophils Relative: 0 %
HCT: 33.2 % — ABNORMAL LOW (ref 39.0–52.0)
Hemoglobin: 10.1 g/dL — ABNORMAL LOW (ref 13.0–17.0)
Immature Granulocytes: 2 %
Lymphocytes Relative: 5 %
Lymphs Abs: 0.9 10*3/uL (ref 0.7–4.0)
MCH: 25.6 pg — ABNORMAL LOW (ref 26.0–34.0)
MCHC: 30.4 g/dL (ref 30.0–36.0)
MCV: 84.1 fL (ref 80.0–100.0)
Monocytes Absolute: 2.2 10*3/uL — ABNORMAL HIGH (ref 0.1–1.0)
Monocytes Relative: 11 %
Neutro Abs: 16.7 10*3/uL — ABNORMAL HIGH (ref 1.7–7.7)
Neutrophils Relative %: 82 %
Platelets: 265 10*3/uL (ref 150–400)
RBC: 3.95 MIL/uL — ABNORMAL LOW (ref 4.22–5.81)
RDW: 17.8 % — ABNORMAL HIGH (ref 11.5–15.5)
WBC: 20.2 10*3/uL — ABNORMAL HIGH (ref 4.0–10.5)
nRBC: 0 % (ref 0.0–0.2)

## 2020-11-13 LAB — SURGICAL PATHOLOGY

## 2020-11-13 MED ORDER — TEMAZEPAM 15 MG PO CAPS: 15.0000 mg | ORAL_CAPSULE | Freq: Every day | ORAL | 0 refills | Status: AC

## 2020-11-13 MED ORDER — LORAZEPAM 0.5 MG PO TABS
0.5000 mg | ORAL_TABLET | Freq: Four times a day (QID) | ORAL | Status: DC | PRN
  Administered 2020-11-13: 0.5 mg via ORAL
  Filled 2020-11-13: qty 1

## 2020-11-13 MED ORDER — LORAZEPAM 0.5 MG PO TABS: 0.5000 mg | ORAL_TABLET | Freq: Four times a day (QID) | ORAL | 0 refills | Status: AC | PRN

## 2020-11-13 MED ORDER — OXYCODONE HCL ER 20 MG PO T12A: 20.0000 mg | EXTENDED_RELEASE_TABLET | Freq: Two times a day (BID) | ORAL | 0 refills | Status: AC

## 2020-11-13 MED ORDER — NYSTATIN 100000 UNIT/ML MT SUSP: 5.0000 mL | Freq: Four times a day (QID) | OROMUCOSAL | 0 refills | Status: AC

## 2020-11-13 MED ORDER — ACETAMINOPHEN 325 MG PO TABS: 650.0000 mg | ORAL_TABLET | Freq: Four times a day (QID) | ORAL | 0 refills | Status: AC | PRN

## 2020-11-13 MED ORDER — SENNOSIDES-DOCUSATE SODIUM 8.6-50 MG PO TABS: 2.0000 | ORAL_TABLET | Freq: Two times a day (BID) | ORAL | 0 refills | Status: AC

## 2020-11-13 MED ORDER — ADULT MULTIVITAMIN W/MINERALS CH: 1.0000 | ORAL_TABLET | Freq: Every day | ORAL | 0 refills | Status: AC

## 2020-11-13 MED ORDER — POLYETHYLENE GLYCOL 3350 17 G PO PACK: 17.0000 g | PACK | Freq: Every day | ORAL | 0 refills | Status: AC

## 2020-11-13 MED ORDER — SIMETHICONE 80 MG PO CHEW: 80.0000 mg | CHEWABLE_TABLET | Freq: Four times a day (QID) | ORAL | 0 refills | Status: AC | PRN

## 2020-11-13 MED ORDER — DEXAMETHASONE 6 MG PO TABS: 12.0000 mg | ORAL_TABLET | Freq: Four times a day (QID) | ORAL | 0 refills | Status: AC

## 2020-11-13 MED ORDER — METOPROLOL TARTRATE 25 MG PO TABS: 25.0000 mg | ORAL_TABLET | Freq: Two times a day (BID) | ORAL | 0 refills | Status: AC

## 2020-11-13 MED ORDER — ENSURE ENLIVE PO LIQD: 237.0000 mL | Freq: Two times a day (BID) | ORAL | 12 refills | Status: AC

## 2020-11-13 MED ORDER — PANTOPRAZOLE SODIUM 40 MG PO TBEC: 40.0000 mg | DELAYED_RELEASE_TABLET | Freq: Two times a day (BID) | ORAL | 0 refills | Status: AC

## 2020-11-13 MED ORDER — HEPARIN SOD (PORK) LOCK FLUSH 100 UNIT/ML IV SOLN
500.0000 [IU] | Freq: Once | INTRAVENOUS | Status: AC
  Administered 2020-11-13: 500 [IU] via INTRAVENOUS
  Filled 2020-11-13: qty 5

## 2020-11-13 MED ORDER — FLUCONAZOLE 100 MG PO TABS: 100.0000 mg | ORAL_TABLET | Freq: Every day | ORAL | 0 refills | Status: AC

## 2020-11-13 MED ORDER — OXYCODONE HCL 10 MG PO TABS: 10.0000 mg | ORAL_TABLET | ORAL | 0 refills | Status: AC | PRN

## 2020-11-13 NOTE — Progress Notes (Signed)
PT Cancellation Note  Patient Details Name: Jess Toney MRN: [id number] DOB: 04/02/1951   Cancelled Treatment:    Reason Eval/Treat Not Completed: Patient at procedure or test/unavailable   Brendia Dampier,KATHrine E 11/13/2020, 2:13 PM Arlyce Dice, DPT Acute Rehabilitation Services Pager: [phone] Office: [phone]

## 2020-11-13 NOTE — Discharge Summary (Signed)
Physician Discharge Summary  Gary Young WNU:[id number] DOB: [birth year] DOA: 11/04/2020  PCP: [name], MD  Admit date: 11/04/2020 Discharge date: 11/13/2020  Admitted From: Home Disposition: Home with Home Health  Recommendations for Outpatient Follow-up:  1. Follow up with PCP in 1-2 weeks 2. Follow up with Medical Oncology within 1-2 weeks 3. Follow up with Radiation Oncology and continue regularly Scheduled XRT 4. Please obtain CMP/CBC, Mag, Phos in one week 5. 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 Marin Olp who is a direct admission on 11/04/2020 for management of bilateral hand pain.  He was initially treated the morphine PCA and eventually transition to OxyContin and oxycodone.  He underwent a Port-A-Cath placement.  Repeat work-up showed diffuse extensive metastasis on chest CT and MRI of the brain showed multiple brain metastasis as well.  He was started on IV Decadron and radiation therapy.  Subsequently he had some guaiac positive stools and iron deficiency anemia GI was consulted and he underwent an EGD which shows melanoma in the GI tract as well.  He was still admitted because he has been his weakness but PT OT work with him and he improved.  He has 24-hour supervision at home and family will be able to take care of him at home and bring him back and forth to his scheduled radiation appointments.  He thinks he is doing better and has been deemed medically stable to be discharged.  He will need follow-up with Dr. Tammi Klippel as well as Dr. 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 Marin Olp -He was placed on IV dexamethasone and this has been changed to p.o. 12 mg daily at the recommendation of Dr. Marin Olp with fluconazole and PPI -Palliative care has been consulted for goals of care -PT OT recommending home health and he is stable to discharge given that he has family support at home  Gas pain -Continue as needed simethicone  Right calf pain -Lower extremity Dopplers ordered 11/11/2020 and showed no evidence of DVT -His calf pain is improved  IDA with heme positive stool -Per GI note, patient has melanoma in his upper GI tract on gastric ulcer biopsy -No further benefit from colonoscopy per their recommendations -From his EGD was positive for metastatic melanoma -GI signed off the case  Leukocytosis -Increased since admission however stable over the last 4 days -To be secondary to IV steroids and extensive cancer -We will be sending out with PPI and continue monitor and trend in the outpatient setting  Intractable pain,  now improved -Reasonably controlled on present regimen  Hyponatremia -Mild -Continue monitor and trend and repeat in outpatient setting  Normocytic anemia -Stable -Patient's hemoglobin/hematocrit is 10.1/33.2 next-check anemia panel in the outpatient setting -Continue monitor for signs and symptoms of bleeding; currently no overt bleeding  noted -Repeat CBC within 1 week   Discharge Instructions  Discharge Instructions    Call MD for:  difficulty breathing, headache or visual disturbances   Complete by: As directed    Call MD for:  extreme fatigue   Complete by: As directed    Call MD for:  hives   Complete by: As directed    Call MD for:  persistant dizziness or light-headedness   Complete by: As directed    Call MD for:  persistant nausea and vomiting   Complete by: As directed    Call MD for:  redness, tenderness, or signs of infection (pain, swelling, redness, odor or green/yellow discharge around incision site)   Complete by: As directed    Call MD for:  severe uncontrolled pain   Complete by: As directed    Call MD for:  temperature >100.4   Complete by: As directed    Diet - low sodium heart healthy   Complete by: As directed    Discharge instructions   Complete by: As directed    You were cared for by a hospitalist during your hospital stay. If you have any questions about your discharge medications or the care you received while you were in the hospital after you are discharged, you can call the unit and ask to speak with the hospitalist on call if the hospitalist that took care of you is not available. Once you are discharged, your primary care physician will handle any further medical issues. Please note that NO REFILLS for any discharge medications will be authorized once you are discharged, as it is imperative that you return to your primary care physician (or establish a relationship with a primary care physician if you do not have one) for your aftercare needs so that they can reassess your need for medications and monitor your lab values.  Follow up with PCP, Medical Oncology, and Radiation Oncology. Take all medications as prescribed. If symptoms change or worsen please return to the ED for evaluation   Increase activity slowly   Complete by: As directed    No wound care   Complete by: As directed       Allergies as of 11/13/2020   No Known Allergies     Medication List    STOP taking these medications   fentaNYL 25 MCG/HR Commonly known as: DURAGESIC   traMADol 50 MG tablet Commonly known as: ULTRAM     TAKE these medications   acetaminophen 325 MG tablet Commonly known as: TYLENOL Take 2 tablets (650 mg total) by mouth every 6 (six) hours as needed for mild pain, fever or headache.   albuterol 108 (90 Base) MCG/ACT inhaler Commonly known as: VENTOLIN HFA Inhale into the lungs.   cyanocobalamin 1000 MCG tablet Take 1,000 mcg by mouth daily.   cyclobenzaprine 10 MG tablet Commonly known as: FLEXERIL Take 1 tablet by mouth 3 (three) times daily as needed.   dexamethasone 6 MG tablet Commonly known as: Decadron Take 2 tablets (12 mg total) by mouth every 6 (six) hours.   doxazosin 8 MG tablet Commonly known as: CARDURA Take 8 mg by mouth daily.   feeding supplement Liqd Take 237 mLs by mouth 2 (two)  times daily between meals.   ferrous sulfate 325 (65 FE) MG tablet Take 325 mg by mouth daily.   fluconazole 100 MG tablet Commonly known as: Diflucan Take 1 tablet (100 mg total) by mouth daily for 14 days.   hydrOXYzine 25 MG tablet Commonly known as: ATARAX/VISTARIL Take 1 tablet (25 mg total) by mouth 3 (three) times daily as needed. What changed: reasons to take this   latanoprost 0.005 % ophthalmic solution Commonly known as: XALATAN Place 1 drop into the left eye at bedtime.   LORazepam 0.5 MG tablet Commonly known as: ATIVAN Take 1 tablet (0.5 mg total) by mouth every 6 (six) hours as needed for anxiety.   metoprolol tartrate 25 MG tablet Commonly known as: LOPRESSOR Take 1 tablet (25 mg total) by mouth 2 (two) times daily.   multivitamin with minerals Tabs tablet Take 1 tablet by mouth daily. Start taking on: November 14, 2020   nystatin 100000 UNIT/ML suspension Commonly known as: MYCOSTATIN Take 5 mLs (500,000 Units total) by mouth 4 (four)  times daily.   oxyCODONE 20 mg 12 hr tablet Commonly known as: OXYCONTIN Take 1 tablet (20 mg total) by mouth every 12 (twelve) hours.   Oxycodone HCl 10 MG Tabs Take 1 tablet (10 mg total) by mouth every 3 (three) hours as needed for moderate pain or breakthrough pain.   pantoprazole 40 MG tablet Commonly known as: PROTONIX Take 1 tablet (40 mg total) by mouth 2 (two) times daily before a meal.   polyethylene glycol 17 g packet Commonly known as: MIRALAX / GLYCOLAX Take 17 g by mouth daily. Start taking on: November 14, 2020   senna-docusate 8.6-50 MG tablet Commonly known as: Senokot-S Take 2 tablets by mouth 2 (two) times daily.   simethicone 80 MG chewable tablet Commonly known as: MYLICON Chew 1 tablet (80 mg total) by mouth every 6 (six) hours as needed for flatulence.   temazepam 15 MG capsule Commonly known as: RESTORIL Take 1 capsule (15 mg total) by mouth at bedtime.   timolol 0.5 % ophthalmic solution Commonly known as: TIMOPTIC Place 1 drop into both eyes daily.   Vitamin D 50 MCG (2000 UT) tablet Take 2,000 Units by mouth daily.       No Known Allergies  Consultations:  GI  Radiation Onc  Med Onc  Palliative Care  IR  Procedures/Studies: MR Brain W Wo Contrast  Result Date: 11/06/2020 CLINICAL DATA:  Recurrent melanoma. EXAM: MRI HEAD WITHOUT AND WITH CONTRAST TECHNIQUE: Multiplanar, multiecho pulse sequences of the brain and surrounding structures were obtained without and with intravenous contrast. CONTRAST:  74m GADAVIST GADOBUTROL 1 MMOL/ML IV SOLN COMPARISON:  None. FINDINGS: Brain: Multiple hemorrhagic metastases are present. Anterior right frontal lobe lesion measures 3.2 x 2.5 x 2.9 cm. An adjacent nonhemorrhagic lesion measures 11 mm. An additional hemorrhagic lesion more superior and posterior to the first lesion described measures 3.4 x 2.9 x 2.4 cm. There is extensive edema surrounding the constellation of lesions. Two nonhemorrhagic  lesions within the right corona radiata measure 4 and 6 mm respectively. Hemorrhagic lesion involves the posterior left lateral ventricle, likely centered on the ependyma. Adjacent left occipital lesion measures 12 mm. A more posterior hemorrhagic occipital lesion measures 6 mm. Hemorrhagic lesion in the left caudate head measures 6 mm. A hemorrhagic lesion in the lateral left temporal lobe measures 16 x 13 x 17 mm. No lesions are present in the posterior fossa. Vasogenic edema is most prominent in the right  frontal lobe with effacement the sulci. There is edema in the posterior left lateral ventricle and left occipital lesions. There is also surrounding vasogenic edema of the left temporal lobe lesion. Mild generalized atrophy is present. There is some T2 signal associated with the lesions of the right corona radiata. Internal auditory canals are within normal limits bilaterally. Vascular: Flow is present in the major intracranial arteries. Skull and upper cervical spine: The craniocervical junction is normal. Upper cervical spine is within normal limits. Marrow signal is unremarkable. Sinuses/Orbits: Small mastoid effusions are present bilaterally without enhancement. No obstructing nasopharyngeal lesion is present. The paranasal sinuses and mastoid air cells are otherwise clear. A right lens replacement is present. Globes and orbits are otherwise within normal limits. IMPRESSION: 1. Multiple bilateral brain metastases. Hemorrhagic lesions are present in the anterior right frontal lobe, left parietoccipital lobe, and left temporal lobe with surrounding vasogenic edema is described. None hemorrhagic lesions are present in the right frontal lobe and right corona radiata. 2. Mild generalized atrophy. 3. Small mastoid effusions bilaterally without enhancement. No obstructing nasopharyngeal lesion is present. Electronically Signed   By: San Morelle M.D.   On: 11/06/2020 16:19   MR THORACIC SPINE W WO  CONTRAST  Result Date: 11/06/2020 CLINICAL DATA:  Metastatic disease. EXAM: MRI THORACIC WITHOUT AND WITH CONTRAST TECHNIQUE: Multiplanar and multiecho pulse sequences of the thoracic spine were obtained without and with intravenous contrast. CONTRAST:  67m GADAVIST GADOBUTROL 1 MMOL/ML IV SOLN COMPARISON:  None. FINDINGS: Alignment:  Normal. Vertebrae: There is a large enhancing lesion in the T11 vertebral body which involves the majority of the vertebral body. This lesion has intrinsic T1 hyperintensity. There is left greater than right ventral epidural extension of tumor which results in moderate left eccentric canal stenosis with flattening of the left ventral cord. Additionally, there is extension of tumor to involve the left posterior elements, including the left pedicle. There are additional metastatic lesions at multiple thoracic levels, including multiple lesions at the T7 (largest lesion measuring 1.3 cm) mild, T9 (largest lesion measuring 1.1 cm), T12 (largest lesion measuring 1.1 cm) and L1 (partially imaged) levels. Additional small lesion at T10 and suspected multiple small lesions at T5. Cord:  Normal cord signal. Paraspinal and other soft tissues: Suspected slight extension of tumor at the T11 level into the adjacent left paraspinal soft tissues. Please see CT chest abdomen pelvis from May Nov 05, 2020 for characterization of additional extra-spinal findings. Disc levels: Stenosis at the T11 and T11-T12 level due to osseous metastatic disease is detailed above. Mild bilateral foraminal stenosis at T10-T11 due to degenerative change. Otherwise, no evidence of significant canal stenosis or foraminal stenosis. IMPRESSION: Multifocal osseous metastases throughout the thoracic spine, as detailed above. At T11 the metastasis replaces the majority of the vertebral body and extends into the posterior elements and the ventral epidural space with resulting moderate canal stenosis, flattening of the left  ventral cord and mild left foraminal stenosis. Electronically Signed   By: FMargaretha SheffieldMD   On: 11/06/2020 17:53   NM Bone Scan Whole Body  Result Date: 11/05/2020 CLINICAL DATA:  Metastatic disease of unknown primary, past history melanoma; groin pain EXAM: NUCLEAR MEDICINE WHOLE BODY BONE SCAN TECHNIQUE: Whole body anterior and posterior images were obtained approximately 3 hours after intravenous injection of radiopharmaceutical. RADIOPHARMACEUTICALS:  21.8 mCi Technetium-91mDP IV COMPARISON:  None Correlation: CT chest abdomen pelvis 11/05/2020 FINDINGS: Uptake in lower thoracic spine at approximately T11, corresponding to lytic lesion on CT.  Uptake seen at the RIGHT sacrum, anterior RIGHT iliac bone, less in LEFT iliac, corresponding to metastatic lesions on CT. Focus of abnormal uptake at the distal RIGHT femoral diaphysis concerning for subtle metastasis recommend radiographic correlation. Nonspecific tracer uptake at the intertrochanteric region of LEFT femur without definite osseous lesion by CT. Uptake at medial and mid RIGHT clavicle corresponding to metastases. No additional worrisome sites of tracer accumulation. Expected urinary tract and soft tissue distribution of tracer. IMPRESSION: Multiple sites of abnormal tracer uptake consistent with osseous metastases, predominantly corresponding with CT abnormalities. Additional sites of uptake are seen at the distal RIGHT femoral diaphysis and at the intertrochanteric region of the LEFT femur concerning for metastases; recommend radiographic correlation of the RIGHT femur to exclude lesion at risk of pathologic fracture. Electronically Signed   By: Lavonia Dana M.D.   On: 11/05/2020 13:52   US SOFT TISSUE HEAD & NECK (NON-THYROID)  Result Date: 11/04/2020 CLINICAL DATA:  History of metastatic disease, now with palpable abnormality involving the right neck. EXAM: ULTRASOUND OF HEAD/NECK SOFT TISSUES TECHNIQUE: Ultrasound examination of the head  and neck soft tissues was performed in the area of clinical concern. COMPARISON:  CT abdomen and pelvis-11/03/2020 FINDINGS: Sonographic evaluation of the patient's palpable area of concern involving the superior aspect of the right-side of the neck correlates with an approximately 1.8 x 1.7 x 1.3 cm mixed echogenic hypoechoic subcutaneous nodule. This nodule appears to abut the dermal surface. Otherwise, there is no sonographic correlate for patient's palpable area of concern. Specifically, no regional cervical lymphadenopathy. IMPRESSION: Patient's palpable area of concern involving the superior aspect of the right-side of the neck correlates with an approximately 1.8 cm mixed echogenic subdermal subcutaneous nodule which given history of metastatic malignancy may represent either a subcutaneous metastasis (favored given presence of subcutaneous metastasis on abdominal CT performed 11/03/2020) versus a malignant appearing lymph node. Further evaluation with PET-CT imaging could be performed as indicated. Electronically Signed   By: Sandi Mariscal M.D.   On: 11/04/2020 16:24   CT CHEST ABDOMEN PELVIS W CONTRAST  Result Date: 11/05/2020 CLINICAL DATA:  Metastatic malignancy of unknown primary. History of melanoma six years ago. EXAM: CT CHEST, ABDOMEN, AND PELVIS WITH CONTRAST TECHNIQUE: Multidetector CT imaging of the chest, abdomen and pelvis was performed following the standard protocol during bolus administration of intravenous contrast. CONTRAST:  15m OMNIPAQUE IOHEXOL 300 MG/ML  SOLN COMPARISON:  Outside CT from 10/24/2020 FINDINGS: CT CHEST FINDINGS Cardiovascular: Normal heart size. No pericardial effusion. Aortic atherosclerosis and coronary artery calcifications. Mediastinum/Nodes: No discrete thyroid nodule. No enlarged axillary or supraclavicular lymph nodes. No mediastinal or hilar adenopathy. Lungs/Pleura: Trace left pleural effusion may be partially loculated. Multifocal pulmonary nodules are  identified. The majority of these nodules are tiny, less than 4 mm, and too numerous to count. The larger nodules include: -large paravertebral mass within the superior segment of left lower lobe measuring 4.5 by 4.2 by 7.0 cm, image 88/6. -posterior right lower lobe nodule measures 0.9 cm, image 94/6. -posterolateral left upper lobe nodule measures 0.8 cm, image 75/6. -Posterior right upper lobe lung nodule measures 1.1 cm, image 59/6. Musculoskeletal: Multifocal lytic bone metastases are identified. -Expansile lytic lesion involving the head of the right clavicle has associated pathologic fracture. This measures approximately 3.6 x 3.7 cm, image 7/2. -Lytic lesion with large soft tissue component involves the right-side of the sternal manubrium measuring 3.8 x 3.8 cm, image 15/2. -Large permeative lesion involving greater than 50% of the T11 vertebral  body is noted measuring at least 3.8 x 3.7 by 2.3 cm. I suspect there is a pathologic fracture involving this vertebra which is vertically oriented extending from the superior to inferior endplate. There is also cortical destruction along the left side of the vertebral body as well as the posterior cortex, image 136/4 and image 139/5. -Smaller lucent lesion is noted within the smaller lucent lesion within T7 measures 1.3 cm, image 35/2. CT ABDOMEN PELVIS FINDINGS Hepatobiliary: Multiple liver metastases are identified. Index lesion within the dome measures 4.1 x 4.6 cm, image 47/2. Index lesion within inferior right hepatic lobe, segment 6, measures 5.8 x 5.7 cm, image 69/2. Index lesion within lateral segment of left hepatic lobe measures 2.2 x 2.0 cm, image 53/2. Multiple small stones noted within the gallbladder. No gallbladder wall thickening or inflammation. Pancreas: Previously normal appearing body and tail of pancreas now appears edematous with peripancreatic soft tissue stranding, image 63/2. Within the head of pancreas there is a focal area of relative  hypoenhancement which measures 2.2 by 3.0 x 2.6 cm, image 75/2 and image 82/4. Similar to the body and tail this area has mild surrounding haziness. No main duct dilatation identified. Spleen: Normal in size without focal abnormality. Adrenals/Urinary Tract: Left adrenal gland appears normal. Nodule in the right adrenal gland measures 3.2 by 1.8 cm, image 61/2. Indeterminate. Cyst arising off the posterior cortex of the upper pole of right kidney measures 5.5 cm, image 66/2. There is an adjacent, mildly hyperdense lesion 3.7 by 2.7 cm, image 71/2. This measures 25 Hounsfield units and therefore does not meet criteria for simple cysts. Similarly, there is a small exophytic lesion arising off the posterolateral cortex of the inferior pole of right kidney measuring 1.3 cm and 30 Hounsfield units, image 84/2. Tiny exophytic lesion arising off the posterior cortex of the interpolar left kidney is too small to characterize measuring 6 mm, image 79/2. Urinary bladder is unremarkable. Stomach/Bowel: Stomach is nondistended. No dilated loops of large or small bowel. No bowel wall thickening or inflammation. No obstructing colon mass identified. Vascular/Lymphatic: Aortic atherosclerosis without aneurysm. Enlarged mesenteric lymph nodes are identified including: -central mesenteric node measures 1.5 cm, image 77/2. -also within the central mesentery is a 1.7 cm lymph node, image 81/2. Reproductive: Mild prostate gland enlargement. Other: No ascites. 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 -Index nodule in the left abdomen along the undersurface of the abdominal wall measures 2.1 cm, image 76/2. -Index nodule in the right abdomen anterior to the ascending colon measures 1.6 cm, image 76/2. Multiple retroperitoneal lesions are also identified. Paravertebral mass posterior to the right psoas muscle measures 7.3 x 4.5 cm, image 90/2. -left lower quadrant pericolic gutter nodule measures 1.2  cm, image 97/2 Within the left abdomen lateral to the interpolar left kidney is a nodule measuring 3.5 cm, image 84/2. Musculoskeletal: Multifocal lytic bone metastases are identified. -large lesion involving the right iliopsoas muscle and right iliac bone measures 7.3 x 6.5 cm, image 98/2. Associated permeative changes are noted involving the iliac bone. -Lytic lesion involving the posterior right iliac bone measures 3.8 x 4.7 cm, image 97/2. -lytic lesion involving the left iliac wing measures 3.8 x 2.1 cm. -A few scattered subcutaneous lesions are also identified, including: -soft tissue nodule within the right flank measuring 1.1 cm, image 89/2. -Right gluteal nodule measuring 0.9 cm, image 95/2. -ventral abdominal wall nodule measures 7 mm, image 103/3. IMPRESSION: 1. Multiple pulmonary metastases. Most of these are tiny and too  numerous to count. There is a dominant lesion involving the left lower lobe with long axis diameter of 7 cm. 2. Multifocal lytic bone metastases. This includes a large permeative lesion involving the T11 vertebral body which arose through the left lateral wall and posterior wall of the vertebral body. If there are clinical concerns for canal involvement consider further evaluation with contrast enhanced MRI of the thoracic spine. 3. Multifocal liver metastasis. 4. Peritoneal and retroperitoneal metastasis. 5. Abnormal appearance of the pancreas. New from 10/24/2020 is edema and peripancreatic soft tissue stranding involving the body and tail of pancreas. Although the appearance is consistent with acute pancreatitis metastatic disease is not excluded. 6. Focal masslike enlargement involving the uncinate process of the pancreas which appears slightly hypodense to the adjacent normal parenchyma is indeterminate. This may represent an area of pancreatitis, metastatic disease or a primary pancreatic neoplasm. 7. Indeterminate right adrenal gland nodule. Cannot rule out metastatic disease. 8.  There is a indeterminate lesion arising off the posterior cortex of the right kidney which does not meet criteria for a simple cyst. Similarly, there is a small complex lesion arising off the inferior pole of the right kidney. 9. Gallstones. 10. Trace left pleural effusion may be partially loculated. 11. Aortic atherosclerosis and coronary artery calcifications. Aortic Atherosclerosis (ICD10-I70.0). Electronically Signed   By: Kerby Moors M.D.   On: 11/05/2020 13:47   IR US Guide Bx Asp/Drain  Result Date: 11/06/2020 INDICATION: Remote history of melanoma, now with metastatic disease of unknown primary. Please from ultrasound-guided biopsy of amenable site of malignancy for tissue diagnostic purposes. Additionally, please place image guided Port a catheter for the initiation of chemotherapy. EXAM: 1. ULTRASOUND-GUIDED BIOPSY OF INDETERMINATE SUBCUTANEOUS NODULE ALONG THE RIGHT LATERAL ABDOMINAL WALL 2. IMPLANTED PORT A CATH PLACEMENT WITH ULTRASOUND AND FLUOROSCOPIC GUIDANCE COMPARISON:  CT the chest, abdomen and pelvis-11/05/2020 MEDICATIONS: None. ANESTHESIA/SEDATION: Moderate (conscious) sedation was employed during this procedure. A total of Versed 3 mg and Fentanyl 100 mcg was administered intravenously. Moderate Sedation Time: 56 minutes. The patient's level of consciousness and vital signs were monitored continuously by radiology nursing throughout the procedure under my direct supervision. CONTRAST:  None FLUOROSCOPY TIME:  36 seconds (60 mGy) COMPLICATIONS: None immediate. PROCEDURE: The procedure, risks, benefits, and alternatives were explained to the patient. Questions regarding the procedure were encouraged and answered. The patient understands and consents to the procedure. Sonographic evaluation was performed of the lateral aspect of the right abdominal wall demonstrating an approximately 1.3 x 0.8 cm hypoechoic nodule correlating with the dominant subcutaneous nodule seen on preceding  abdominal CT image 89, series 2. This subcutaneous nodule was targeted for biopsy given location and sonographic window. The skin overlying the nodule was prepped and draped in usual sterile fashion. After the overlying soft tissues were anesthetized 1% lidocaine with epinephrine, 6 core needle biopsy samples were obtained with an 18 gauge core needle biopsy device. Samples were placed in formalin and submitted to pathology for analysis. Attention was now paid towards placement of the port a catheter. The right neck and chest were prepped with chlorhexidine in a sterile fashion, and a sterile drape was applied covering the operative field. Maximum barrier sterile technique with sterile gowns and gloves were used for the procedure. A timeout was performed prior to the initiation of the procedure. Local anesthesia was provided with 1% lidocaine with epinephrine. After creating a small venotomy incision, a micropuncture kit was utilized to access the internal jugular vein. Real-time ultrasound guidance was utilized  for vascular access including the acquisition of a permanent ultrasound image documenting patency of the accessed vessel. The microwire was utilized to measure appropriate catheter length. A subcutaneous port pocket was then created along the upper chest wall utilizing a combination of sharp and blunt dissection. The pocket was irrigated with sterile saline. A single lumen "Slim" sized power injectable port was chosen for placement. The 8 Fr catheter was tunneled from the port pocket site to the venotomy incision. The port was placed in the pocket. The external catheter was trimmed to appropriate length. At the venotomy, an 8 Fr peel-away sheath was placed over a guidewire under fluoroscopic guidance. The catheter was then placed through the sheath and the sheath was removed. Final catheter positioning was confirmed and documented with a fluoroscopic spot radiograph. The port was accessed with a Huber  needle, aspirated and flushed with heparinized saline. The venotomy site was closed with an interrupted 4-0 Vicryl suture. The port pocket incision was closed with interrupted 2-0 Vicryl suture. The skin was opposed with a running subcuticular 4-0 Vicryl suture. Dermabond and Steri-strips were applied to both incisions. Dressings were applied. The patient tolerated the procedure well without immediate post procedural complication. FINDINGS: Sonographic evaluation was performed of the lateral aspect of the right abdominal wall demonstrating an approximately 1.3 x 0.8 cm hypoechoic nodule correlating with the dominant subcutaneous nodule seen on preceding abdominal CT image 89, series 2. This nodule was successfully biopsied with ultrasound guidance yielding the acquisition of adequate tissue. After catheter placement, the tip lies within the superior cavoatrial junction. The catheter aspirates and flushes normally and is ready for immediate use. IMPRESSION: 1. Technically successful ultrasound-guided core needle biopsy of indeterminate subcutaneous nodule within the right lateral abdominal wall. 2. Successful placement of a right internal jugular approach power injectable Port-A-Cath. The catheter is ready for immediate use. Electronically Signed   By: Sandi Mariscal M.D.   On: 11/06/2020 08:45   DG Chest Port 1 View  Result Date: 11/04/2020 CLINICAL DATA:  Shoulder pain, recent cancer diagnosis with RIGHT shoulder pain. EXAM: PORTABLE CHEST 1 VIEW COMPARISON:  CT of the abdomen and pelvis from outside facility on Oct 24, 2020. FINDINGS: Trachea midline. Cardiomediastinal contours and hilar structures with LEFT infrahilar lobulation in the setting of known LEFT lower lobe mass. Elevation of the RIGHT hemidiaphragm as before. No lobar consolidation.  No gross pleural effusion. Suspected small nodule at the LEFT lung base as well. On limited assessment, no acute skeletal process. IMPRESSION: 1. No acute  cardiopulmonary disease in the setting of LEFT infrahilar lobulation with known LEFT lower lobe mass. 2. Other small basilar nodules not well assessed. Electronically Signed   By: Zetta Bills M.D.   On: 11/04/2020 16:54   IR IMAGING GUIDED PORT INSERTION  Result Date: 11/06/2020 INDICATION: Remote history of melanoma, now with metastatic disease of unknown primary. Please from ultrasound-guided biopsy of amenable site of malignancy for tissue diagnostic purposes. Additionally, please place image guided Port a catheter for the initiation of chemotherapy. EXAM: 1. ULTRASOUND-GUIDED BIOPSY OF INDETERMINATE SUBCUTANEOUS NODULE ALONG THE RIGHT LATERAL ABDOMINAL WALL 2. IMPLANTED PORT A CATH PLACEMENT WITH ULTRASOUND AND FLUOROSCOPIC GUIDANCE COMPARISON:  CT the chest, abdomen and pelvis-11/05/2020 MEDICATIONS: None. ANESTHESIA/SEDATION: Moderate (conscious) sedation was employed during this procedure. A total of Versed 3 mg and Fentanyl 100 mcg was administered intravenously. Moderate Sedation Time: 56 minutes. The patient's level of consciousness and vital signs were monitored continuously by radiology nursing throughout the procedure under my  direct supervision. CONTRAST:  None FLUOROSCOPY TIME:  36 seconds (60 mGy) COMPLICATIONS: None immediate. PROCEDURE: The procedure, risks, benefits, and alternatives were explained to the patient. Questions regarding the procedure were encouraged and answered. The patient understands and consents to the procedure. Sonographic evaluation was performed of the lateral aspect of the right abdominal wall demonstrating an approximately 1.3 x 0.8 cm hypoechoic nodule correlating with the dominant subcutaneous nodule seen on preceding abdominal CT image 89, series 2. This subcutaneous nodule was targeted for biopsy given location and sonographic window. The skin overlying the nodule was prepped and draped in usual sterile fashion. After the overlying soft tissues were anesthetized  1% lidocaine with epinephrine, 6 core needle biopsy samples were obtained with an 18 gauge core needle biopsy device. Samples were placed in formalin and submitted to pathology for analysis. Attention was now paid towards placement of the port a catheter. The right neck and chest were prepped with chlorhexidine in a sterile fashion, and a sterile drape was applied covering the operative field. Maximum barrier sterile technique with sterile gowns and gloves were used for the procedure. A timeout was performed prior to the initiation of the procedure. Local anesthesia was provided with 1% lidocaine with epinephrine. After creating a small venotomy incision, a micropuncture kit was utilized to access the internal jugular vein. Real-time ultrasound guidance was utilized for vascular access including the acquisition of a permanent ultrasound image documenting patency of the accessed vessel. The microwire was utilized to measure appropriate catheter length. A subcutaneous port pocket was then created along the upper chest wall utilizing a combination of sharp and blunt dissection. The pocket was irrigated with sterile saline. A single lumen "Slim" sized power injectable port was chosen for placement. The 8 Fr catheter was tunneled from the port pocket site to the venotomy incision. The port was placed in the pocket. The external catheter was trimmed to appropriate length. At the venotomy, an 8 Fr peel-away sheath was placed over a guidewire under fluoroscopic guidance. The catheter was then placed through the sheath and the sheath was removed. Final catheter positioning was confirmed and documented with a fluoroscopic spot radiograph. The port was accessed with a Huber needle, aspirated and flushed with heparinized saline. The venotomy site was closed with an interrupted 4-0 Vicryl suture. The port pocket incision was closed with interrupted 2-0 Vicryl suture. The skin was opposed with a running subcuticular 4-0 Vicryl  suture. Dermabond and Steri-strips were applied to both incisions. Dressings were applied. The patient tolerated the procedure well without immediate post procedural complication. FINDINGS: Sonographic evaluation was performed of the lateral aspect of the right abdominal wall demonstrating an approximately 1.3 x 0.8 cm hypoechoic nodule correlating with the dominant subcutaneous nodule seen on preceding abdominal CT image 89, series 2. This nodule was successfully biopsied with ultrasound guidance yielding the acquisition of adequate tissue. After catheter placement, the tip lies within the superior cavoatrial junction. The catheter aspirates and flushes normally and is ready for immediate use. IMPRESSION: 1. Technically successful ultrasound-guided core needle biopsy of indeterminate subcutaneous nodule within the right lateral abdominal wall. 2. Successful placement of a right internal jugular approach power injectable Port-A-Cath. The catheter is ready for immediate use. Electronically Signed   By: Sandi Mariscal M.D.   On: 11/06/2020 08:45   VAS [place] LOWER EXTREMITY VENOUS (DVT)  Result Date: 11/11/2020  Lower Venous DVT Study Patient Name:  DUANNE DUCHESNE  Date of Exam:   11/11/2020 Medical Rec #: [id number]  Accession #:    [id number] Date of Birth: [birth year]      Patient Gender: M Patient Age:   69Y Exam Location:  [facility] Procedure:      VAS [place] LOWER EXTREMITY VENOUS (DVT) Referring Phys: [id number] PREETHA JOSEPH --------------------------------------------------------------------------------  Indications: Edema.  Risk Factors: None identified. Limitations: Poor ultrasound/tissue interface. Comparison Study: No prior studies. Performing Technologist: Oliver Hum RVT  Examination Guidelines: A complete evaluation includes B-mode imaging, spectral Doppler, color Doppler, and power Doppler as needed of all accessible portions of each vessel. Bilateral testing is considered an integral part of a  complete examination. Limited examinations for reoccurring indications may be performed as noted. The reflux portion of the exam is performed with the patient in reverse Trendelenburg.  +---------+---------------+---------+-----------+----------+--------------+ RIGHT    CompressibilityPhasicitySpontaneityPropertiesThrombus Aging +---------+---------------+---------+-----------+----------+--------------+ CFV      Full           Yes      Yes                                 +---------+---------------+---------+-----------+----------+--------------+ SFJ      Full                                                        +---------+---------------+---------+-----------+----------+--------------+ FV Prox  Full                                                        +---------+---------------+---------+-----------+----------+--------------+ FV Mid   Full                                                        +---------+---------------+---------+-----------+----------+--------------+ FV DistalFull                                                        +---------+---------------+---------+-----------+----------+--------------+ PFV      Full                                                        +---------+---------------+---------+-----------+----------+--------------+ POP      Full           Yes      Yes                                 +---------+---------------+---------+-----------+----------+--------------+ PTV      Full                                                        +---------+---------------+---------+-----------+----------+--------------+  PERO     Full                                                        +---------+---------------+---------+-----------+----------+--------------+   +---------+---------------+---------+-----------+----------+-------------------+ LEFT     CompressibilityPhasicitySpontaneityPropertiesThrombus Aging       +---------+---------------+---------+-----------+----------+-------------------+ CFV      Full           Yes      Yes                                      +---------+---------------+---------+-----------+----------+-------------------+ SFJ      Full                                                             +---------+---------------+---------+-----------+----------+-------------------+ FV Prox  Full                                                             +---------+---------------+---------+-----------+----------+-------------------+ FV Mid   Full                                                             +---------+---------------+---------+-----------+----------+-------------------+ FV DistalFull                                                             +---------+---------------+---------+-----------+----------+-------------------+ PFV      Full                                                             +---------+---------------+---------+-----------+----------+-------------------+ POP      Full           Yes      Yes                                      +---------+---------------+---------+-----------+----------+-------------------+ PTV      Full                                                             +---------+---------------+---------+-----------+----------+-------------------+  PERO                                                  Not well visualized +---------+---------------+---------+-----------+----------+-------------------+     Summary: RIGHT: - There is no evidence of deep vein thrombosis in the lower extremity.  - No cystic structure found in the popliteal fossa.  LEFT: - There is no evidence of deep vein thrombosis in the lower extremity. However, portions of this examination were limited- see technologist comments above.  - No cystic structure found in the popliteal fossa.  *See table(s) above for measurements and  observations. Electronically signed by Harold Barban MD on 11/11/2020 at 10:18:14 PM.    Final     EGD Findings:      Patchy, white plaques were found in the mid esophagus and in the distal       esophagus. Biopsies were taken with a cold forceps for histology.      The Z-line was regular and was found 43 cm from the incisors.      A medium amount of food (residue) was found in the gastric fundus and in       the gastric body.      Scattered mild inflammation characterized by congestion (edema),       erosions, erythema and friability was found in the entire examined       stomach.      One non-bleeding superficial gastric ulcer with no stigmata of bleeding       was found in the gastric antrum. The lesion was 4 mm in largest       dimension. Biopsies were taken with a cold forceps for histology.      Multiple small sessile polyps were found in the gastric body. Biopsies       were taken with a cold forceps for histology.      Food (residue) was found in the duodenal bulb.      The first portion of the duodenum and second portion of the duodenum       were normal. Impression:               - Esophageal plaques were found, suspicious for                            candidiasis. Biopsied.                           - Z-line regular, 43 cm from the incisors.                           - A medium amount of food (residue) in the stomach.                           - Gastritis.                           - Non-bleeding gastric ulcer with no stigmata of                            bleeding. Biopsied.                           -  Multiple gastric polyps. Biopsied.                           - Retained food in the duodenum.                           - Normal first portion of the duodenum and second                            portion of the duodenum.  Subjective: Seen Examined at bedside he is doing better.  Denies any chest pain, shortness of breath.  Feels little bit stronger.  No other concerns or  complaints at this time and ready to go home as he has a support at home  Discharge Exam: Vitals:   11/12/20 2015 11/13/20 0658  BP: 136/78 94/75  Pulse: 85 96  Resp: 16 17  Temp: 97.8 F (36.6 C) 97.8 F (36.6 C)  SpO2: 95% 94%   Vitals:   11/12/20 0945 11/12/20 1454 11/12/20 2015 11/13/20 0658  BP: 136/85 113/73 136/78 94/75  Pulse: 82 79 85 96  Resp:  _0 Temp:  97.7 F (36.5 C) 97.8 F (36.6 C) 97.8 F (36.6 C)  TempSrc:  Oral Oral Oral  SpO2:  95% 95% 94%  Weight:      Height:       General: Pt is alert, awake, not in acute distress Cardiovascular: RRR, S1/S2 +, no rubs, no gallops Respiratory: Diminished bilaterally, no wheezing, no rhonchi; unlabored breathing Abdominal: Soft, NT, distended slightly, bowel sounds + Extremities: Minimal edema, no cyanosis  The results of significant diagnostics from this hospitalization (including imaging, microbiology, ancillary and laboratory) are listed below for reference.    Microbiology: Recent Results (from the past 240 hour(s))  Resp Panel by RT-PCR (Flu A&B, Covid) Nasopharyngeal Swab     Status: None   Collection Time: 11/04/20  4:44 PM   Specimen: Nasopharyngeal Swab; Nasopharyngeal(NP) swabs in vial transport medium  Result Value Ref Range Status   SARS Coronavirus 2 by RT PCR NEGATIVE NEGATIVE Final    Comment: (NOTE) SARS-CoV-2 target nucleic acids are NOT DETECTED.  The SARS-CoV-2 RNA is generally detectable in upper respiratory specimens during the acute phase of infection. The lowest concentration of SARS-CoV-2 viral copies this assay can detect is 138 copies/mL. A negative result does not preclude SARS-Cov-2 infection and should not be used as the sole basis for treatment or other patient management decisions. A negative result may occur with  improper specimen collection/handling, submission of specimen other than nasopharyngeal swab, presence of viral mutation(s) within the areas targeted by this  assay, and inadequate number of viral copies(<138 copies/mL). A negative result must be combined with clinical observations, patient history, and epidemiological information. The expected result is Negative.  Fact Sheet for Patients:  EntrepreneurPulse.com.au  Fact Sheet for Healthcare Providers:  IncredibleEmployment.be  This test is no t yet approved or cleared by the [place] FDA and  has been authorized for detection and/or diagnosis of SARS-CoV-2 by FDA under an Emergency Use Authorization (EUA). This EUA will remain  in effect (meaning this test can be used) for the duration of the COVID-19 declaration under Section 564(b)(1) of the Act, 21 U.S.C.section 360bbb-3(b)(1), unless the authorization is terminated  or revoked sooner.       Influenza A by PCR NEGATIVE NEGATIVE Final  Influenza B by PCR NEGATIVE NEGATIVE Final    Comment: (NOTE) The Xpert Xpress SARS-CoV-2/FLU/RSV plus assay is intended as an aid in the diagnosis of influenza from Nasopharyngeal swab specimens and should not be used as a sole basis for treatment. Nasal washings and aspirates are unacceptable for Xpert Xpress SARS-CoV-2/FLU/RSV testing.  Fact Sheet for Patients: EntrepreneurPulse.com.au  Fact Sheet for Healthcare Providers: IncredibleEmployment.be  This test is not yet approved or cleared by the [place] FDA and has been authorized for detection and/or diagnosis of SARS-CoV-2 by FDA under an Emergency Use Authorization (EUA). This EUA will remain in effect (meaning this test can be used) for the duration of the COVID-19 declaration under Section 564(b)(1) of the Act, 21 U.S.C. section 360bbb-3(b)(1), unless the authorization is terminated or revoked.  Performed at [facility], [place] [address] [place], [place] [address]     Labs: BNP (last 3 results) No results for input(s): BNP in the  last 8760 hours. Basic Metabolic Panel: Recent Labs  Lab 11/07/20 0431 11/10/20 0619 11/11/20 0423 11/12/20 0556 11/13/20 0610  NA 137 136  --  132* 131*  K 4.3 4.7  --  4.9 4.7  CL 97* 99  --  96* 95*  CO2 29 31  --  28 29  GLUCOSE 203* 135*  --  142* 128*  BUN 23 34*  --  28* 27*  CREATININE 0.52* 0.74 0.63 0.61 0.50*  CALCIUM 9.3 8.1*  --  7.8* 7.7*   Liver Function Tests: Recent Labs  Lab 11/07/20 0431 11/12/20 0556 11/13/20 0610  AST 28 37 30  ALT 26 43 42  ALKPHOS 105 114 106  BILITOT 0.4 0.4 0.7  PROT 5.8* 5.5* 5.3*  ALBUMIN 2.7* 2.6* 2.6*   No results for input(s): LIPASE, AMYLASE in the last 168 hours. No results for input(s): AMMONIA in the last 168 hours. CBC: Recent Labs  Lab 11/07/20 0431 11/09/20 0754 11/10/20 0619 11/11/20 0423 11/12/20 0556 11/13/20 0610  WBC 16.8* 20.3* 19.1* 20.2* 20.5* 20.2*  NEUTROABS 14.9*  --  15.5* 17.0* 17.4* 16.7*  HGB 8.9* 9.9* 9.7* 9.9* 10.0* 10.1*  HCT 29.1* 33.3* 31.9* 32.1* 33.2* 33.2*  MCV 83.4 85.4 85.1 84.7 84.7 84.1  PLT 352 342 352 264 279 265   Cardiac Enzymes: No results for input(s): CKTOTAL, CKMB, CKMBINDEX, TROPONINI in the last 168 hours. BNP: Invalid input(s): POCBNP CBG: No results for input(s): GLUCAP in the last 168 hours. D-Dimer No results for input(s): DDIMER in the last 72 hours. Hgb A1c No results for input(s): HGBA1C in the last 72 hours. Lipid Profile No results for input(s): CHOL, HDL, LDLCALC, TRIG, CHOLHDL, LDLDIRECT in the last 72 hours. Thyroid function studies No results for input(s): TSH, T4TOTAL, T3FREE, THYROIDAB in the last 72 hours.  Invalid input(s): FREET3 Anemia work up No results for input(s): VITAMINB12, FOLATE, FERRITIN, TIBC, IRON, RETICCTPCT in the last 72 hours. Urinalysis    Component Value Date/Time   COLORURINE YELLOW 11/04/2020 2200   APPEARANCEUR HAZY (A) 11/04/2020 2200   LABSPEC 1.026 11/04/2020 2200   PHURINE 5.0 11/04/2020 2200   GLUCOSEU NEGATIVE  11/04/2020 2200   HGBUR NEGATIVE 11/04/2020 2200   BILIRUBINUR NEGATIVE 11/04/2020 2200   KETONESUR 5 (A) 11/04/2020 2200   PROTEINUR NEGATIVE 11/04/2020 2200   NITRITE NEGATIVE 11/04/2020 2200   LEUKOCYTESUR NEGATIVE 11/04/2020 2200   Sepsis Labs Invalid input(s): PROCALCITONIN,  WBC,  LACTICIDVEN Microbiology Recent Results (from the past 240 hour(s))  Resp Panel by  RT-PCR (Flu A&B, Covid) Nasopharyngeal Swab     Status: None   Collection Time: 11/04/20  4:44 PM   Specimen: Nasopharyngeal Swab; Nasopharyngeal(NP) swabs in vial transport medium  Result Value Ref Range Status   SARS Coronavirus 2 by RT PCR NEGATIVE NEGATIVE Final    Comment: (NOTE) SARS-CoV-2 target nucleic acids are NOT DETECTED.  The SARS-CoV-2 RNA is generally detectable in upper respiratory specimens during the acute phase of infection. The lowest concentration of SARS-CoV-2 viral copies this assay can detect is 138 copies/mL. A negative result does not preclude SARS-Cov-2 infection and should not be used as the sole basis for treatment or other patient management decisions. A negative result may occur with  improper specimen collection/handling, submission of specimen other than nasopharyngeal swab, presence of viral mutation(s) within the areas targeted by this assay, and inadequate number of viral copies(<138 copies/mL). A negative result must be combined with clinical observations, patient history, and epidemiological information. The expected result is Negative.  Fact Sheet for Patients:  EntrepreneurPulse.com.au  Fact Sheet for Healthcare Providers:  IncredibleEmployment.be  This test is no t yet approved or cleared by the [place] FDA and  has been authorized for detection and/or diagnosis of SARS-CoV-2 by FDA under an Emergency Use Authorization (EUA). This EUA will remain  in effect (meaning this test can be used) for the duration of the COVID-19  declaration under Section 564(b)(1) of the Act, 21 U.S.C.section 360bbb-3(b)(1), unless the authorization is terminated  or revoked sooner.       Influenza A by PCR NEGATIVE NEGATIVE Final   Influenza B by PCR NEGATIVE NEGATIVE Final    Comment: (NOTE) The Xpert Xpress SARS-CoV-2/FLU/RSV plus assay is intended as an aid in the diagnosis of influenza from Nasopharyngeal swab specimens and should not be used as a sole basis for treatment. Nasal washings and aspirates are unacceptable for Xpert Xpress SARS-CoV-2/FLU/RSV testing.  Fact Sheet for Patients: EntrepreneurPulse.com.au  Fact Sheet for Healthcare Providers: IncredibleEmployment.be  This test is not yet approved or cleared by the [place] FDA and has been authorized for detection and/or diagnosis of SARS-CoV-2 by FDA under an Emergency Use Authorization (EUA). This EUA will remain in effect (meaning this test can be used) for the duration of the COVID-19 declaration under Section 564(b)(1) of the Act, 21 U.S.C. section 360bbb-3(b)(1), unless the authorization is terminated or revoked.  Performed at [facility], [place] [address], [place], [place] [address]    Time coordinating discharge: 35 minutes  SIGNED:  Kerney Elbe, DO [facility] 11/13/2020, 7:56 PM Pager is on [place]  If 7PM-7AM, please contact night-coverage [email or web address]

## 2020-11-13 NOTE — Plan of Care (Signed)
Pt reports generalized pain but well managed with Rx; pain located in groin area and rib cage; was able to rest comfortably. No s/s of acute distress reported or observed; call light within reach and bed at lowest position for safety.

## 2020-11-13 NOTE — Progress Notes (Signed)
Daily Progress Note   Patient Name: Gary Young       Date: 11/13/2020 DOB: [birth year]  Age: 70 y.o. MRN#: [id number] Attending Physician: Kerney Elbe, DO Primary Care Physician: [name], MD Admit Date: 11/04/2020  Reason for Consultation/Follow-up: Establishing goals of care and Pain control  Subjective: Medical records reviewed. Assessed patient at the bedside and met with wife [name] and his youngest daughter at the bedside. [name] reports feeling well today. He denies pain although he reports anxiety and shortness of breath, which he attributes to thoughts of his upcoming radiation treatment.  Mary notes that [name]'s anxiety seems to be worst at night and before radiation. He had a difficult night, with an episode of chills and soiling the bed without realizing it. She wonders if he is awakening due to anxiety, as he was doing very well when she left in the afternoon. A trial of low dose ativan was offered and the risks and benefits of benzodiazepines were discussed.   Readdressed options for proceeding with radiation therapy inpatient vs outpatient. [name] and his family continue to voice preference for discharge home as soon as possible. They feel confident they can manage transportation to and from radiation. They also feel there is adequate support from multiple family members to provide 24/7 supervision. Outpatient palliative care services were offered and explained again. They are quite interested and appreciative of a referral.  Questions and concerns addressed. PMT will continue to support holistically.   Length of Stay: 8  Current Medications: Scheduled Meds:  . Chlorhexidine Gluconate Cloth  6 each Topical Daily  . cholecalciferol  2,000 Units Oral Daily  . doxazosin  8  mg Oral QPM  . enoxaparin (LOVENOX) injection  40 mg Subcutaneous Q24H  . feeding supplement  237 mL Oral BID BM  .  HYDROmorphone (DILAUDID) injection  1 mg Intravenous Once  . latanoprost  1 drop Left Eye QHS  . metoprolol tartrate  25 mg Oral BID  . multivitamin with minerals  1 tablet Oral Daily  . nystatin  5 mL Oral QID  . oxyCODONE  20 mg Oral Q12H  . pantoprazole  40 mg Oral BID AC  . polyethylene glycol  17 g Oral Daily  . senna-docusate  2 tablet Oral BID  . temazepam  15 mg Oral QHS  .  timolol  1 drop Both Eyes Daily  . cyanocobalamin  1,000 mcg Oral Daily    Continuous Infusions: . dexamethasone (DECADRON) IVPB ([facility]) 20 mg (11/13/20 1019)  . lactated ringers Stopped (11/11/20 1705)    PRN Meds: acetaminophen, hydrOXYzine, labetalol, LORazepam, metoprolol tartrate, oxyCODONE, simethicone  Physical Exam Vitals and nursing note reviewed.  Cardiovascular:     Rate and Rhythm: Normal rate.  Pulmonary:     Effort: Pulmonary effort is normal.  Skin:    General: Skin is warm and dry.  Neurological:     Mental Status: He is alert and oriented to person, place, and time.  Psychiatric:        Behavior: Behavior is cooperative.             Vital Signs: BP 94/75 (BP Location: Left Arm)   Pulse 96   Temp 97.8 F (36.6 C) (Oral)   Resp 17   Ht 6' (1.829 m)   Wt 105.4 kg   SpO2 94%   BMI 31.51 kg/m  SpO2: SpO2: 94 % O2 Device: O2 Device: Room Air O2 Flow Rate: O2 Flow Rate (L/min): 2 L/min  Intake/output summary:   Intake/Output Summary (Last 24 hours) at 11/13/2020 1034 Last data filed at 11/12/2020 1454 Gross per 24 hour  Intake 120 ml  Output --  Net 120 ml   LBM: Last BM Date: 11/12/20 Baseline Weight: Weight: 105.7 kg Most recent weight: Weight: 105.4 kg       Palliative Assessment/Data: 50%     Patient Active Problem List   Diagnosis Date Noted  . Brain metastases (HCC) 11/10/2020  . Bone metastasis (HCC) 11/10/2020  . Liver masses   . Mass  in neck   . 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 Malignant melanoma (HCC)   . Goals of care, counseling/discussion   . Palliative care by specialist   . Intractable pain 11/04/2020    Palliative Care Assessment & Plan   Patient Profile: 70 y.o. male  with past medical history of melanoma s/p excision, iron deficiency anemia, and moderate protein calorie malnutrition admitted on 11/04/2020 with complaints of weakness and uncontrolled pain.   He was recently found to have metastatic diseaseon CT scan at outside facilityinvolving his lungs, liver, and bones with unknown primary. Referred to Dr. Myna Hidalgo and prescribed fentanyl patched and tramadol, without relief.   Assessment: Metastatic melanoma Cancer-associated pain, well-controlled Anxiety  Recommendations/Plan: Ativan 0.5mg  PO PRN Q6H for anxiety Consult TOC for assistance with outpatient palliative care referral Discussed preference for discharge home with Dr. [name] Mcalpine Ongoing support from PMT  Goals of Care and Additional Recommendations: Limitations on Scope of Treatment: Full Scope Treatment  Code Status: DNR/DNI   Code Status Orders  (From admission, onward)         Start     Ordered   11/04/20 1830  Do not attempt resuscitation (DNR)  Continuous       Question Answer Comment  In the event of cardiac or respiratory ARREST Do not call a "code blue"   In the event of cardiac or respiratory ARREST Do not perform Intubation, CPR, defibrillation or ACLS   In the event of cardiac or respiratory ARREST Use medication by any route, position, wound care, and other measures to relive pain and suffering. May use oxygen, suction and manual treatment of airway obstruction as needed for comfort.      11/04/20 1829        Code Status History  Date Active Date Inactive Code Status Order ID Comments User Context   11/04/2020 1728 11/04/2020 1829 Full Code [id number]  Kayleen Memos DO ED   Advance Care Planning Activity     Advance Directive Documentation   Flowsheet Row Most Recent Value  Type of Advance Directive Living will, Healthcare Power of Attorney  Pre-existing out of facility DNR order (yellow form or pink MOST form) --  "MOST" Form in Place? --      Prognosis:  Poor long-term prognosis given widely metastatic cancer  Discharge Planning: Home with Home Health and outpatient palliative care referral   Total time: 25 minutes Greater than 50% of this time was spent in counseling and coordinating care related to the above assessment and plan.  Dorthy Cooler, PA-C Palliative Medicine Team Team phone # [phone]  Thank you for allowing the Palliative Medicine Team to assist in the care of this patient. Please utilize secure chat with additional questions, if there is no response within 30 minutes please call the above phone number.  Palliative Medicine Team providers are available by phone from 7am to 7pm daily and can be reached through the team cell phone.  Should this patient require assistance outside of these hours, please call the patient's attending physician.

## 2020-11-13 NOTE — Progress Notes (Signed)
Gary Young is starting radiation therapy.  Hopefully, he will do well with this and respond.  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  Apparently, biopsy came back positive for an ulcer.  I really am not surprised by this.  It is hard of say what kind of night he had.  He was says that he has had a terrible night.  It is also somewhat difficult to figure out how his pain control is doing.  I have to believe that this is doing a little bit better for him.  He says he is eating.  There is no nausea or vomiting.  Review of says he was out of bed.  Physical therapy is doing a fantastic job with him.  I see that palliative care is also helping [place] out.  I do appreciate their assistance.  His labs show a white cell count 20.2.  Hemoglobin 10.1.  Platelet count 265,000.  His potassium is 4.7.  BUN 27 creatinine 0.5.  Calcium 7.7 with an albumin of 2.6.  We are awaiting the BRAF analysis of his biopsy.  This will tell us how we can treat him systemically.  Again, I think the real issue is whether or not he can do radiation therapy as an outpatient.  Not sure how well his wife can be able to get him back and forth from home.  I very much appreciate the wonderful and compassion care that he is getting from all the staff on 6 E.  Lattie Haw, MD  [place] 3:19

## 2020-11-13 NOTE — Care Management Important Message (Signed)
Important Message  Patient Details IM Letter given to the Patient. Name: Gary Young MRN: [id number] Date of Birth: September 12, 1950   Medicare Important Message Given:  Yes     Kerin Salen 11/13/2020, 9:43 AM

## 2020-11-16 ENCOUNTER — Ambulatory Visit
Admission: RE | Admit: 2020-11-16 | Discharge: 2020-11-16 | Disposition: A | Discharge status: Home / Self Care | Payer: Medicare HMO | Source: Ambulatory Visit | Attending: Radiation Oncology | Admitting: Radiation Oncology

## 2020-11-16 ENCOUNTER — Other Ambulatory Visit: Payer: Self-pay

## 2020-11-16 DIAGNOSIS — E860 Dehydration: Secondary | ICD-10-CM | POA: Diagnosis not present

## 2020-11-17 ENCOUNTER — Ambulatory Visit
Admission: RE | Admit: 2020-11-17 | Discharge: 2020-11-17 | Disposition: A | Discharge status: Home / Self Care | Payer: Medicare HMO | Source: Ambulatory Visit | Attending: Radiation Oncology | Admitting: Radiation Oncology

## 2020-11-18 ENCOUNTER — Ambulatory Visit: Payer: Medicare HMO

## 2020-11-18 ENCOUNTER — Other Ambulatory Visit: Payer: Self-pay

## 2020-11-18 ENCOUNTER — Emergency Department (HOSPITAL_COMMUNITY): Payer: Medicare HMO

## 2020-11-18 ENCOUNTER — Encounter: Payer: Self-pay | Admitting: *Deleted

## 2020-11-18 ENCOUNTER — Inpatient Hospital Stay (HOSPITAL_COMMUNITY)
Admission: EM | Admit: 2020-11-18 | Discharge: 2020-12-11 | DRG: 641 | Disposition: E | Payer: Medicare HMO | Attending: Internal Medicine | Admitting: Internal Medicine

## 2020-11-18 ENCOUNTER — Encounter (HOSPITAL_COMMUNITY): Payer: Self-pay

## 2020-11-18 ENCOUNTER — Telehealth: Payer: Self-pay | Admitting: *Deleted

## 2020-11-18 DIAGNOSIS — E860 Dehydration: Principal | ICD-10-CM | POA: Diagnosis present

## 2020-11-18 DIAGNOSIS — Z7401 Bed confinement status: Secondary | ICD-10-CM

## 2020-11-18 DIAGNOSIS — Z20822 Contact with and (suspected) exposure to covid-19: Secondary | ICD-10-CM | POA: Diagnosis present

## 2020-11-18 DIAGNOSIS — R252 Cramp and spasm: Secondary | ICD-10-CM | POA: Diagnosis present

## 2020-11-18 DIAGNOSIS — D630 Anemia in neoplastic disease: Secondary | ICD-10-CM | POA: Diagnosis present

## 2020-11-18 DIAGNOSIS — Z923 Personal history of irradiation: Secondary | ICD-10-CM

## 2020-11-18 DIAGNOSIS — E871 Hypo-osmolality and hyponatremia: Secondary | ICD-10-CM | POA: Diagnosis present

## 2020-11-18 DIAGNOSIS — M25551 Pain in right hip: Secondary | ICD-10-CM | POA: Diagnosis present

## 2020-11-18 DIAGNOSIS — R109 Unspecified abdominal pain: Secondary | ICD-10-CM | POA: Diagnosis present

## 2020-11-18 DIAGNOSIS — K219 Gastro-esophageal reflux disease without esophagitis: Secondary | ICD-10-CM | POA: Diagnosis present

## 2020-11-18 DIAGNOSIS — G893 Neoplasm related pain (acute) (chronic): Secondary | ICD-10-CM | POA: Diagnosis present

## 2020-11-18 DIAGNOSIS — R339 Retention of urine, unspecified: Secondary | ICD-10-CM

## 2020-11-18 DIAGNOSIS — D509 Iron deficiency anemia, unspecified: Secondary | ICD-10-CM | POA: Diagnosis present

## 2020-11-18 DIAGNOSIS — Z66 Do not resuscitate: Secondary | ICD-10-CM | POA: Diagnosis present

## 2020-11-18 DIAGNOSIS — Z79899 Other long term (current) drug therapy: Secondary | ICD-10-CM

## 2020-11-18 DIAGNOSIS — R338 Other retention of urine: Secondary | ICD-10-CM | POA: Diagnosis present

## 2020-11-18 DIAGNOSIS — R262 Difficulty in walking, not elsewhere classified: Secondary | ICD-10-CM | POA: Diagnosis present

## 2020-11-18 DIAGNOSIS — C7931 Secondary malignant neoplasm of brain: Secondary | ICD-10-CM | POA: Diagnosis present

## 2020-11-18 DIAGNOSIS — C7951 Secondary malignant neoplasm of bone: Secondary | ICD-10-CM | POA: Diagnosis present

## 2020-11-18 DIAGNOSIS — D72829 Elevated white blood cell count, unspecified: Secondary | ICD-10-CM | POA: Diagnosis present

## 2020-11-18 DIAGNOSIS — R531 Weakness: Secondary | ICD-10-CM

## 2020-11-18 DIAGNOSIS — F419 Anxiety disorder, unspecified: Secondary | ICD-10-CM | POA: Diagnosis present

## 2020-11-18 DIAGNOSIS — R7989 Other specified abnormal findings of blood chemistry: Secondary | ICD-10-CM | POA: Diagnosis present

## 2020-11-18 DIAGNOSIS — E875 Hyperkalemia: Secondary | ICD-10-CM | POA: Diagnosis present

## 2020-11-18 DIAGNOSIS — G9340 Encephalopathy, unspecified: Secondary | ICD-10-CM | POA: Diagnosis present

## 2020-11-18 DIAGNOSIS — J45909 Unspecified asthma, uncomplicated: Secondary | ICD-10-CM | POA: Diagnosis present

## 2020-11-18 DIAGNOSIS — C439 Malignant melanoma of skin, unspecified: Secondary | ICD-10-CM | POA: Diagnosis present

## 2020-11-18 DIAGNOSIS — K30 Functional dyspepsia: Secondary | ICD-10-CM | POA: Diagnosis present

## 2020-11-18 LAB — TROPONIN I (HIGH SENSITIVITY)
Troponin I (High Sensitivity): 7 ng/L (ref <18)
Troponin I (High Sensitivity): 7 ng/L (ref <18)

## 2020-11-18 LAB — LACTIC ACID, PLASMA
Lactic Acid, Venous: 3.3 mmol/L (ref 0.5–1.9)
Lactic Acid, Venous: 3.1 mmol/L (ref 0.5–1.9)
Lactic Acid, Venous: 3.1 mmol/L (ref 0.5–1.9)
Lactic Acid, Venous: 3.1 mmol/L (ref 0.5–1.9)

## 2020-11-18 LAB — COMPREHENSIVE METABOLIC PANEL
ALT: 55 U/L — ABNORMAL HIGH (ref 0–44)
AST: 38 U/L (ref 15–41)
Albumin: 2.9 g/dL — ABNORMAL LOW (ref 3.5–5.0)
Alkaline Phosphatase: 128 U/L — ABNORMAL HIGH (ref 38–126)
Anion gap: 9 (ref 5–15)
BUN: 42 mg/dL — ABNORMAL HIGH (ref 8–23)
CO2: 26 mmol/L (ref 22–32)
Calcium: 7.5 mg/dL — ABNORMAL LOW (ref 8.9–10.3)
Chloride: 93 mmol/L — ABNORMAL LOW (ref 98–111)
Creatinine, Ser: 0.76 mg/dL (ref 0.61–1.24)
GFR, Estimated: >60 mL/min (ref >60)
Glucose, Bld: 170 mg/dL — ABNORMAL HIGH (ref 70–99)
Potassium: 5.4 mmol/L — ABNORMAL HIGH (ref 3.5–5.1)
Sodium: 128 mmol/L — ABNORMAL LOW (ref 135–145)
Total Bilirubin: 0.6 mg/dL (ref 0.3–1.2)
Total Protein: 5.8 g/dL — ABNORMAL LOW (ref 6.5–8.1)

## 2020-11-18 LAB — URINALYSIS, ROUTINE W REFLEX MICROSCOPIC
Bilirubin Urine: NEGATIVE
Glucose, UA: 50 mg/dL — AB
Hgb urine dipstick: NEGATIVE
Ketones, ur: 5 mg/dL — AB
Leukocytes,Ua: NEGATIVE
Nitrite: NEGATIVE
Protein, ur: NEGATIVE mg/dL
Specific Gravity, Urine: 1.029 (ref 1.005–1.030)
pH: 5 (ref 5.0–8.0)

## 2020-11-18 LAB — TYPE AND SCREEN
ABO/RH(D): O NEG
Antibody Screen: NEGATIVE

## 2020-11-18 LAB — CBC WITH DIFFERENTIAL/PLATELET
Abs Immature Granulocytes: 0.77 10*3/uL — ABNORMAL HIGH (ref 0.00–0.07)
Basophils Absolute: 0.1 10*3/uL (ref 0.0–0.1)
Basophils Relative: 0 %
Eosinophils Absolute: 0 10*3/uL (ref 0.0–0.5)
Eosinophils Relative: 0 %
HCT: 36.1 % — ABNORMAL LOW (ref 39.0–52.0)
Hemoglobin: 11.2 g/dL — ABNORMAL LOW (ref 13.0–17.0)
Immature Granulocytes: 3 %
Lymphocytes Relative: 1 %
Lymphs Abs: 0.4 10*3/uL — ABNORMAL LOW (ref 0.7–4.0)
MCH: 25.7 pg — ABNORMAL LOW (ref 26.0–34.0)
MCHC: 31 g/dL (ref 30.0–36.0)
MCV: 82.8 fL (ref 80.0–100.0)
Monocytes Absolute: 2.2 10*3/uL — ABNORMAL HIGH (ref 0.1–1.0)
Monocytes Relative: 7 %
Neutro Abs: 27.1 10*3/uL — ABNORMAL HIGH (ref 1.7–7.7)
Neutrophils Relative %: 89 %
Platelets: 266 10*3/uL (ref 150–400)
RBC: 4.36 MIL/uL (ref 4.22–5.81)
RDW: 18.4 % — ABNORMAL HIGH (ref 11.5–15.5)
WBC: 30.5 10*3/uL — ABNORMAL HIGH (ref 4.0–10.5)
nRBC: 0 % (ref 0.0–0.2)

## 2020-11-18 LAB — RESP PANEL BY RT-PCR (FLU A&B, COVID) ARPGX2
Influenza A by PCR: NEGATIVE
Influenza B by PCR: NEGATIVE
SARS Coronavirus 2 by RT PCR: NEGATIVE

## 2020-11-18 LAB — PROTIME-INR
INR: 1.1 (ref 0.8–1.2)
Prothrombin Time: 14.4 seconds (ref 11.4–15.2)

## 2020-11-18 LAB — ABO/RH: ABO/RH(D): O NEG

## 2020-11-18 IMAGING — DX DG CHEST 1V PORT
1 series · 1 of 1 positions shown · non-contrast
Comparison: Chest CT [DATE]

CLINICAL DATA: Weakness.  Post radiation treatment yesterday.

EXAM:
PORTABLE CHEST 1 VIEW

[chest ap]
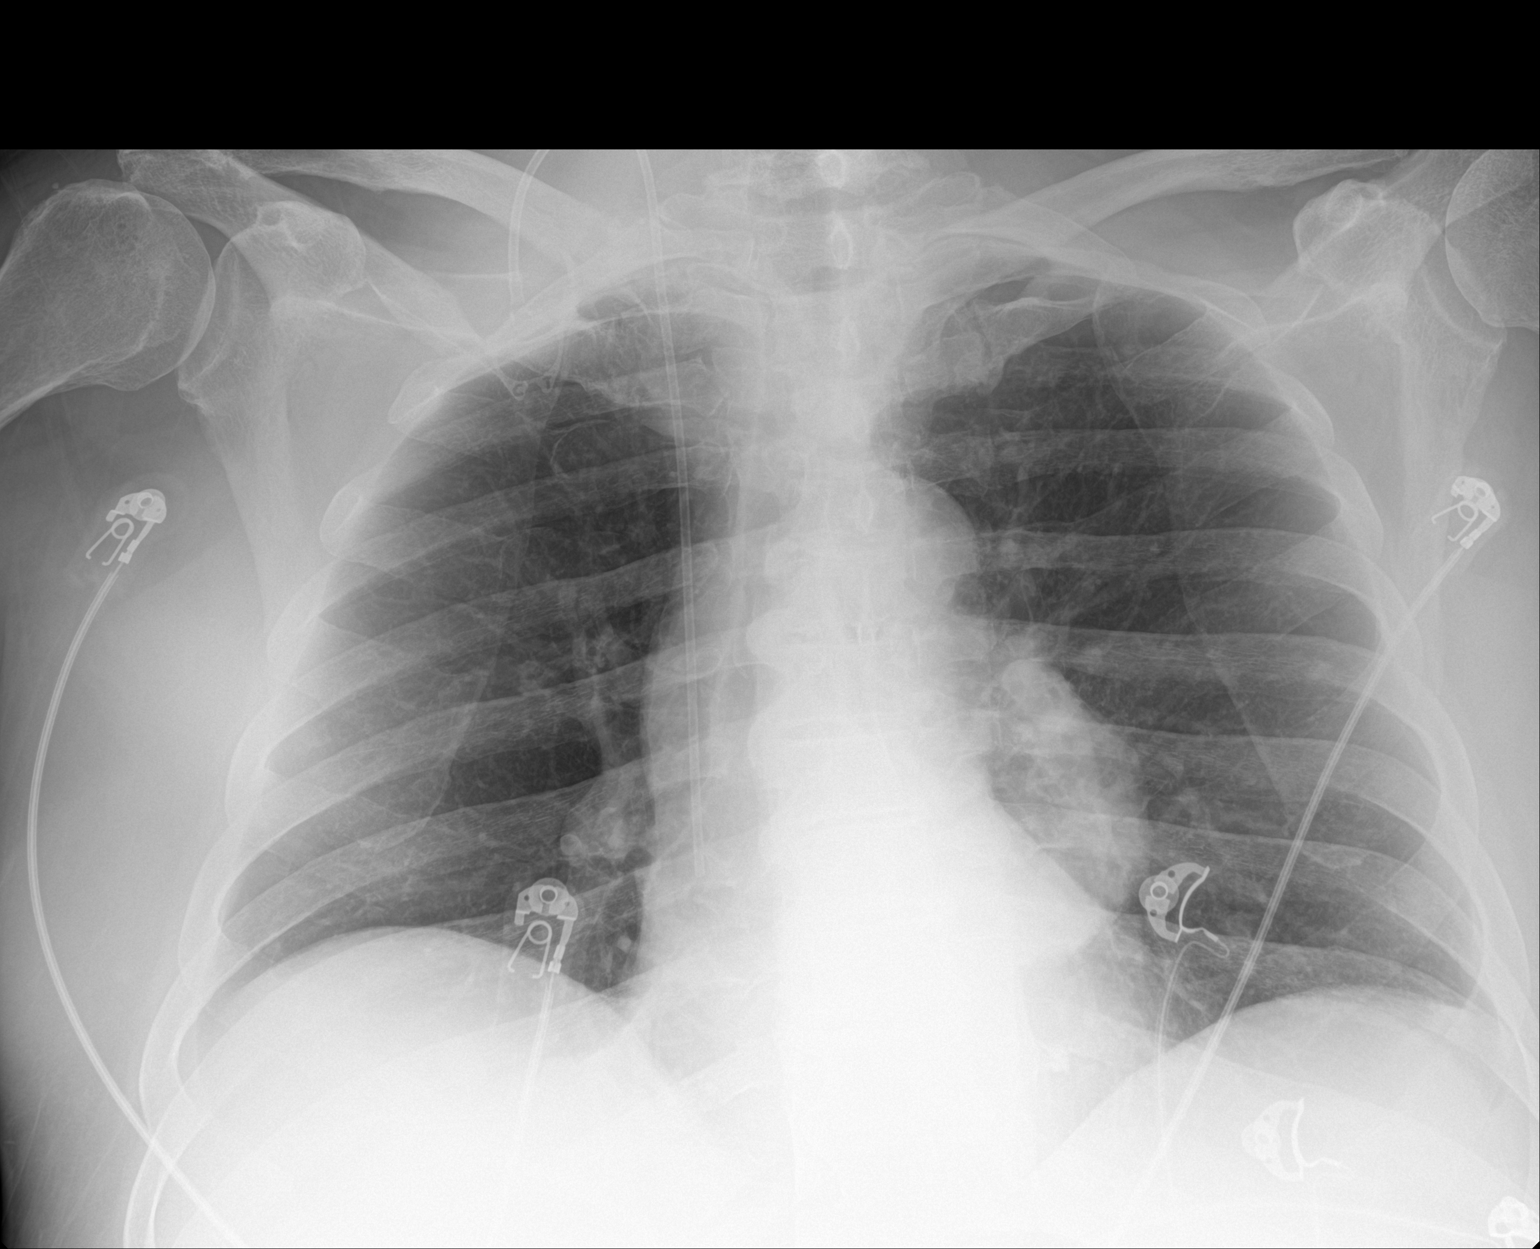

[1 of 1 positions shown; findings below may reference images not displayed]

FINDINGS: Injectable port terminates at the cavoatrial junction.

The cardiac silhouette is normal.

Enlarging left pleural based medial soft tissue mass versus
mediastinal mass. No evidence of focal airspace consolidation.

Osseous structures are without acute abnormality. Soft tissues are
grossly normal.
IMPRESSION: Enlarging left pleural based medial soft tissue mass versus
mediastinal mass.

## 2020-11-18 IMAGING — CR DG HIP (WITH OR WITHOUT PELVIS) 2-3V*R*
3 series · 3 of 3 positions shown · non-contrast
Comparison: [DATE] plain film.  CT [DATE].

CLINICAL DATA: Right hip pain. Metastatic melanoma. Currently
receiving radiation to this area.

EXAM:
DG HIP (WITH OR WITHOUT PELVIS) 2-3V RIGHT

[x pelvis]
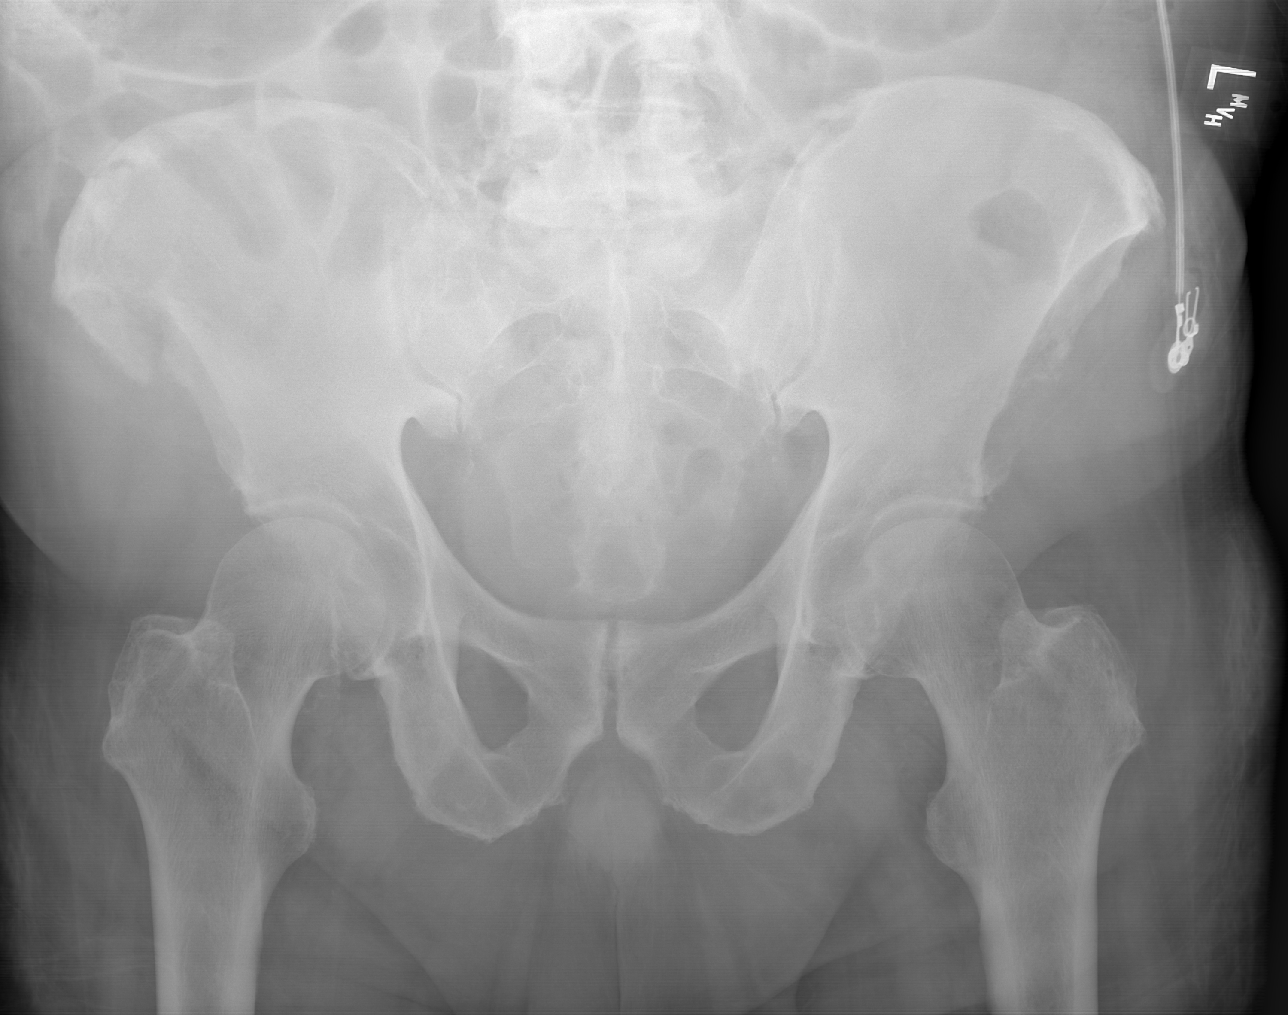

[x hip ap right]
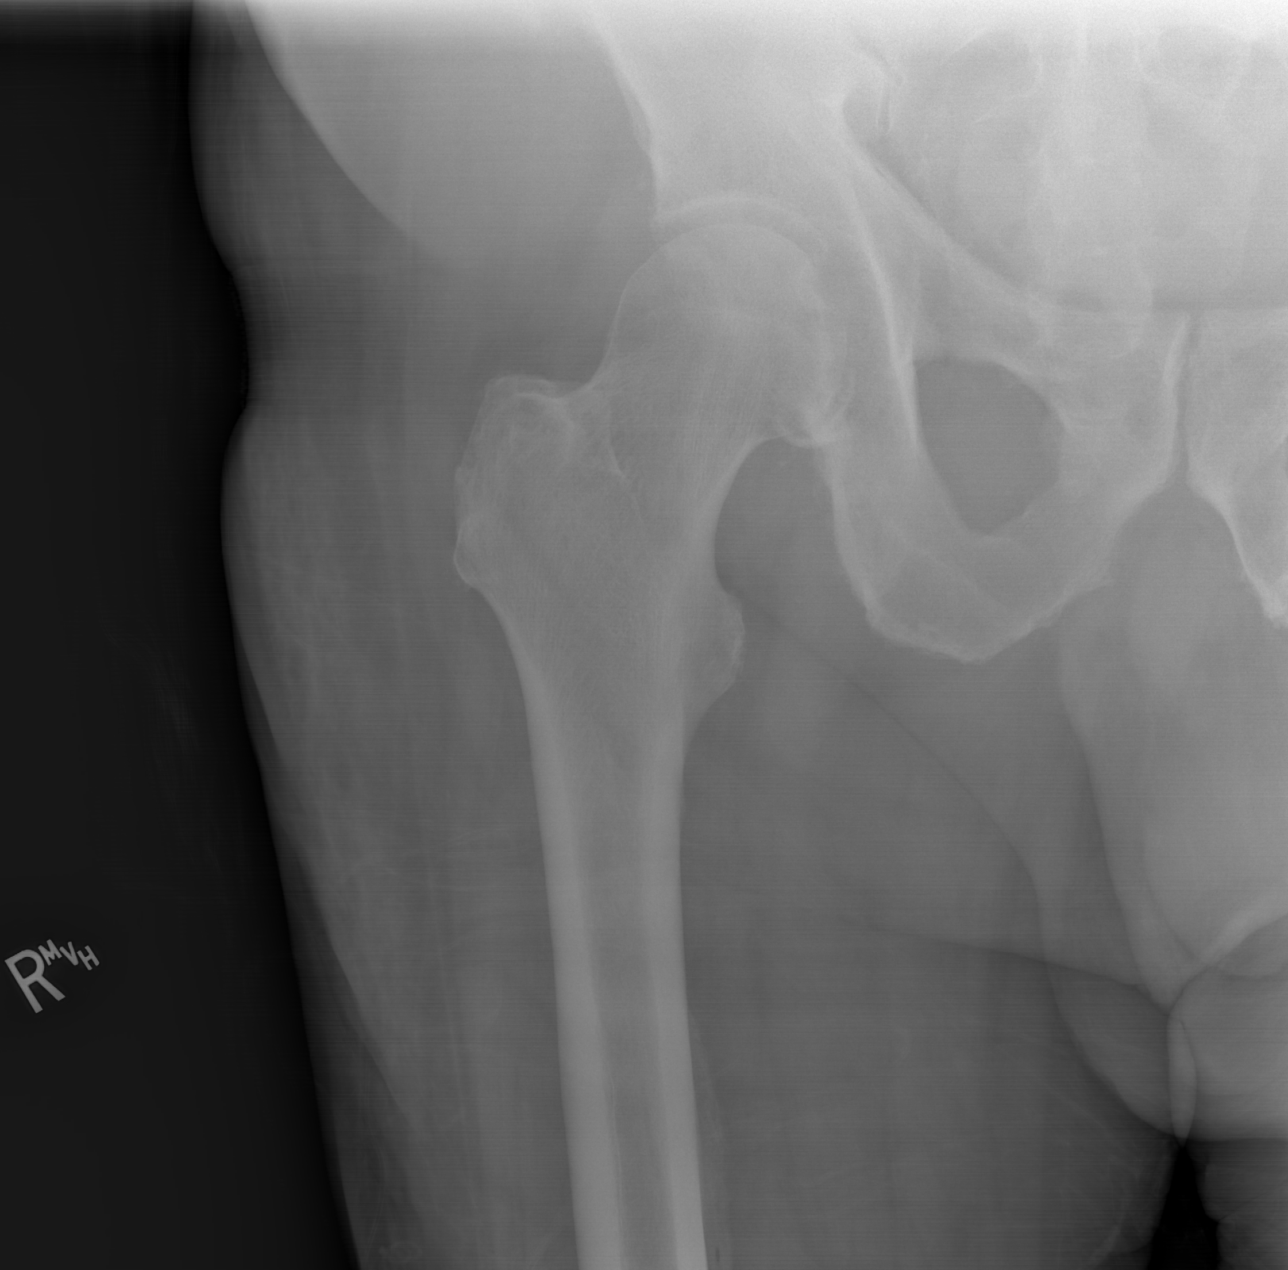

[x hip lat right]
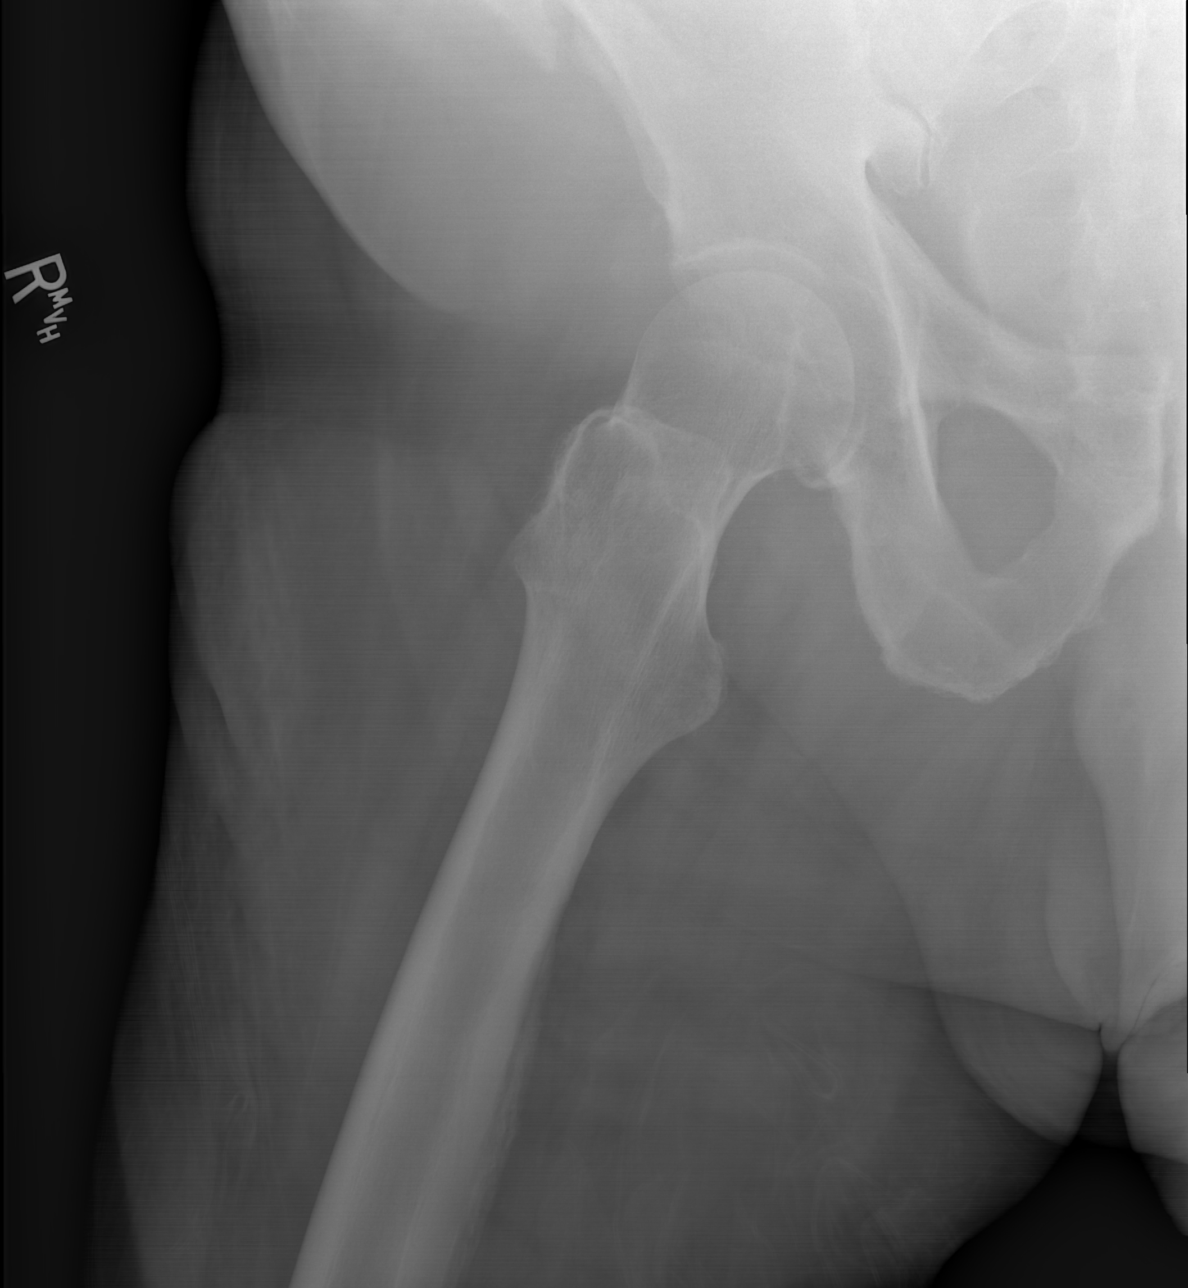

[3 of 3 positions shown; findings below may reference images not displayed]

FINDINGS: AP view the pelvis and AP/frog leg views of the right hip.

Bowel gas overlies the right iliac crest and medial superior iliac
bone, at the site of a permeative lesions on the prior CT. Femoral
heads are located. Sacroiliac joints are symmetric. No acute
fracture.
IMPRESSION: No acute osseous abnormality.

Lytic lesions involving the right ilium are not well evaluated
secondary to overlying bowel gas.

## 2020-11-18 MED ORDER — TIMOLOL MALEATE 0.5 % OP SOLN
1.0000 [drp] | Freq: Every day | OPHTHALMIC | Status: DC
  Administered 2020-11-19 – 2020-11-20 (×2): 1 [drp] via OPHTHALMIC
  Filled 2020-11-18: qty 5

## 2020-11-18 MED ORDER — NYSTATIN 100000 UNIT/ML MT SUSP
5.0000 mL | Freq: Four times a day (QID) | OROMUCOSAL | Status: DC
  Administered 2020-11-18 – 2020-11-19 (×5): 500000 [IU] via ORAL
  Filled 2020-11-18 (×4): qty 5

## 2020-11-18 MED ORDER — VITAMIN D 25 MCG (1000 UNIT) PO TABS
2000.0000 [IU] | ORAL_TABLET | Freq: Every day | ORAL | Status: DC
  Administered 2020-11-19 – 2020-11-20 (×2): 2000 [IU] via ORAL
  Filled 2020-11-18 (×3): qty 2

## 2020-11-18 MED ORDER — ADULT MULTIVITAMIN W/MINERALS CH
1.0000 | ORAL_TABLET | Freq: Every day | ORAL | Status: DC
  Administered 2020-11-19 – 2020-11-20 (×2): 1 via ORAL
  Filled 2020-11-18 (×3): qty 1

## 2020-11-18 MED ORDER — METOPROLOL TARTRATE 25 MG PO TABS
25.0000 mg | ORAL_TABLET | Freq: Two times a day (BID) | ORAL | Status: DC
  Administered 2020-11-18 – 2020-11-21 (×7): 25 mg via ORAL
  Filled 2020-11-18 (×7): qty 1

## 2020-11-18 MED ORDER — OXYCODONE HCL ER 20 MG PO T12A
20.0000 mg | EXTENDED_RELEASE_TABLET | Freq: Two times a day (BID) | ORAL | Status: DC
  Administered 2020-11-18 – 2020-11-20 (×4): 20 mg via ORAL
  Filled 2020-11-18 (×4): qty 1

## 2020-11-18 MED ORDER — SODIUM CHLORIDE 0.9 % IV SOLN: INTRAVENOUS | Status: DC

## 2020-11-18 MED ORDER — DEXAMETHASONE 4 MG PO TABS
12.0000 mg | ORAL_TABLET | Freq: Four times a day (QID) | ORAL | Status: DC
  Administered 2020-11-18 – 2020-11-19 (×5): 12 mg via ORAL
  Filled 2020-11-18 (×4): qty 3

## 2020-11-18 MED ORDER — SODIUM CHLORIDE 0.9 % IV BOLUS
1000.0000 mL | Freq: Once | INTRAVENOUS | Status: AC
  Administered 2020-11-18: 1000 mL via INTRAVENOUS

## 2020-11-18 MED ORDER — DEXAMETHASONE 4 MG PO TABS: 12.0000 mg | ORAL_TABLET | Freq: Four times a day (QID) | ORAL | Status: DC

## 2020-11-18 MED ORDER — FERROUS SULFATE 325 (65 FE) MG PO TABS
325.0000 mg | ORAL_TABLET | Freq: Every day | ORAL | Status: DC
  Administered 2020-11-19: 325 mg via ORAL
  Filled 2020-11-18: qty 1

## 2020-11-18 MED ORDER — ALBUTEROL SULFATE (2.5 MG/3ML) 0.083% IN NEBU: 2.5000 mg | INHALATION_SOLUTION | Freq: Four times a day (QID) | RESPIRATORY_TRACT | Status: DC | PRN

## 2020-11-18 MED ORDER — DOXAZOSIN MESYLATE 8 MG PO TABS
8.0000 mg | ORAL_TABLET | Freq: Every day | ORAL | Status: DC
  Administered 2020-11-18 – 2020-11-21 (×4): 8 mg via ORAL
  Filled 2020-11-18 (×2): qty 2
  Filled 2020-11-18 (×4): qty 1
  Filled 2020-11-18: qty 2
  Filled 2020-11-18: qty 1
  Filled 2020-11-18: qty 2

## 2020-11-18 MED ORDER — ENSURE ENLIVE PO LIQD
237.0000 mL | Freq: Two times a day (BID) | ORAL | Status: DC
  Administered 2020-11-19: 237 mL via ORAL

## 2020-11-18 MED ORDER — VITAMIN B-12 1000 MCG PO TABS
1000.0000 ug | ORAL_TABLET | Freq: Every day | ORAL | Status: DC
  Administered 2020-11-19 – 2020-11-20 (×2): 1000 ug via ORAL
  Filled 2020-11-18 (×3): qty 1

## 2020-11-18 MED ORDER — HYDROMORPHONE HCL 1 MG/ML IJ SOLN
1.0000 mg | INTRAMUSCULAR | Status: DC | PRN
  Administered 2020-11-18 – 2020-11-21 (×2): 1 mg via INTRAVENOUS
  Filled 2020-11-18 (×3): qty 1

## 2020-11-18 MED ORDER — LORAZEPAM 0.5 MG PO TABS
0.5000 mg | ORAL_TABLET | Freq: Four times a day (QID) | ORAL | Status: DC | PRN
  Administered 2020-11-20 – 2020-11-21 (×3): 0.5 mg via ORAL
  Filled 2020-11-18 (×3): qty 1

## 2020-11-18 MED ORDER — POLYETHYLENE GLYCOL 3350 17 G PO PACK
17.0000 g | PACK | Freq: Every day | ORAL | Status: DC
  Administered 2020-11-18 – 2020-11-20 (×3): 17 g via ORAL
  Filled 2020-11-18 (×4): qty 1

## 2020-11-18 MED ORDER — OXYCODONE HCL 5 MG PO TABS
10.0000 mg | ORAL_TABLET | ORAL | Status: DC | PRN
  Administered 2020-11-18 – 2020-11-21 (×5): 10 mg via ORAL
  Filled 2020-11-18 (×6): qty 2

## 2020-11-18 MED ORDER — FLUCONAZOLE 100 MG PO TABS
100.0000 mg | ORAL_TABLET | Freq: Every day | ORAL | Status: DC
  Administered 2020-11-19 – 2020-11-21 (×3): 100 mg via ORAL
  Filled 2020-11-18 (×3): qty 1

## 2020-11-18 MED ORDER — SIMETHICONE 80 MG PO CHEW
80.0000 mg | CHEWABLE_TABLET | Freq: Four times a day (QID) | ORAL | Status: DC | PRN
  Administered 2020-11-21: 80 mg via ORAL
  Filled 2020-11-18 (×2): qty 1

## 2020-11-18 MED ORDER — CALCIUM CARBONATE ANTACID 500 MG PO CHEW
1.0000 | CHEWABLE_TABLET | Freq: Three times a day (TID) | ORAL | Status: DC | PRN
  Administered 2020-11-18 – 2020-11-21 (×5): 200 mg via ORAL
  Filled 2020-11-18 (×5): qty 1

## 2020-11-18 MED ORDER — SODIUM CHLORIDE 0.9 % IV SOLN: Freq: Once | INTRAVENOUS | Status: AC

## 2020-11-18 MED ORDER — TEMAZEPAM 15 MG PO CAPS
15.0000 mg | ORAL_CAPSULE | Freq: Every day | ORAL | Status: DC
  Administered 2020-11-18 – 2020-11-21 (×4): 15 mg via ORAL
  Filled 2020-11-18 (×4): qty 1

## 2020-11-18 MED ORDER — LATANOPROST 0.005 % OP SOLN
1.0000 [drp] | Freq: Every day | OPHTHALMIC | Status: DC
  Administered 2020-11-18 – 2020-11-21 (×4): 1 [drp] via OPHTHALMIC
  Filled 2020-11-18: qty 2.5

## 2020-11-18 MED ORDER — HYDROXYZINE HCL 25 MG PO TABS: 25.0000 mg | ORAL_TABLET | Freq: Three times a day (TID) | ORAL | Status: DC | PRN

## 2020-11-18 MED ORDER — SODIUM ZIRCONIUM CYCLOSILICATE 5 G PO PACK
5.0000 g | PACK | Freq: Once | ORAL | Status: AC
  Administered 2020-11-18: 5 g via ORAL
  Filled 2020-11-18: qty 1

## 2020-11-18 NOTE — ED Notes (Signed)
Lactic 3.3, Messick MD notified.

## 2020-11-18 NOTE — Progress Notes (Signed)
Dr Marin Olp requests to have patient come into the office once BRAF results are back to discuss treatment.   Results aren't yet resulted, but patient is now in the ED for pain control. Will follow for possible discharge, post discharge follow up and needs.   Oncology Nurse Navigator Documentation  Oncology Nurse Navigator Flowsheets 11/11/2020  Abnormal Finding Date -  Confirmed Diagnosis Date -  Diagnosis Status -  Phase of Treatment -  Radiation Actual Start Date: -  Navigator Follow Up Date: 11/20/2020  Navigator Follow Up Reason: Appointment Review  Navigator Location [facility]  Referral Date to RadOnc/MedOnc -  Navigator Encounter Type Appt/Treatment Plan Review;Pathology Review  Telephone -  Treatment Initiated Date -  Patient Visit Type MedOnc  Treatment Phase Active Tx  Barriers/Navigation Needs Coordination of Care;Education  Education -  Interventions None Required  Acuity Level 2-Minimal Needs (1-2 Barriers Identified)  Coordination of Care -  Education Method -  Support Groups/Services Friends and Family  Time Spent with Patient 15

## 2020-11-18 NOTE — H&P (Signed)
History and Physical    Gary Young KDX:[id number] DOB: 18-Dec-1950 DOA: 12/01/2020  PCP: [name], MD  Patient coming from: Home  Chief Complaint: difficulty walking and pain in hip  HPI: Gary Young is a 70 y.o. male with medical history significant of metastatic melanoma, chronic pain, iron deficiency anemia. Presenting with right hip pain. Recent admission of intractable pain. During that admission, he was noted to have widely metastatic melanoma including brain and GI lesions. He was sent home on decadron 12mg  q6h. He completed an XRT session yesterday. Per his wife's report, shortly after he got home, he developed cramping in his right hip. It initially started there, but then spread throughout his body. The pain did not respond to his pain control regimen. It continued through this morning. He was weak and unable to walk by this time. He was requiring the assistance of his family to get around. They became concerned and brought him to the ED for eval. They deny any other aggravating or alleviating factors.     ED Course: He was found to have an elevated lactic acid and WBC. He appeared clinically dry. He was started on fluids. TRH was called for admission.   Review of Systems:  Review of systems is otherwise negative for all not mentioned in HPI.   PMHx Past Medical History:  Diagnosis Date  . Asthma   . Cancer [facility])     PSHx Past Surgical History:  Procedure Laterality Date  . BACK SURGERY    . BIOPSY  11/11/2020   Procedure: BIOPSY;  Surgeon: Otis Brace, MD;  Location: [facility] ENDOSCOPY;  Service: Gastroenterology;;  . ESOPHAGOGASTRODUODENOSCOPY N/A 11/11/2020   Procedure: ESOPHAGOGASTRODUODENOSCOPY (EGD);  Surgeon: Otis Brace, MD;  Location: [facility];  Service: Gastroenterology;  Laterality: N/A;  . EYE SURGERY    . IR IMAGING GUIDED PORT INSERTION  11/05/2020  . IR US GUIDE BX ASP/DRAIN  11/05/2020  . SKIN BIOPSY    . TONSILLECTOMY      SocHx  reports  that he has never smoked. He has never used smokeless tobacco. He reports current alcohol use of about 2.0 standard drinks of alcohol per week. He reports that he does not use drugs.  No Known Allergies  FamHx No family history on file.  Prior to Admission medications   Medication Sig Start Date End Date Taking? Authorizing Provider  acetaminophen (TYLENOL) 325 MG tablet Take 2 tablets (650 mg total) by mouth every 6 (six) hours as needed for mild pain, fever or headache. 11/13/20  Yes Sheikh, Omair Latif, DO  albuterol (VENTOLIN HFA) 108 (90 Base) MCG/ACT inhaler Inhale 1-2 puffs into the lungs every 6 (six) hours as needed for wheezing or shortness of breath. 10/01/20  Yes [provider]  calcium carbonate (TUMS - DOSED IN MG ELEMENTAL CALCIUM) 500 MG chewable tablet Chew 1 tablet by mouth 3 (three) times daily as needed for indigestion or heartburn.   Yes [provider]  Cholecalciferol (VITAMIN D) 50 MCG (2000 UT) tablet Take 2,000 Units by mouth daily.   Yes [provider]  cyanocobalamin 1000 MCG tablet Take 1,000 mcg by mouth daily.   Yes [provider]  dexamethasone (DECADRON) 6 MG tablet Take 2 tablets (12 mg total) by mouth every 6 (six) hours. Patient taking differently: Take 12 mg by mouth 4 (four) times daily. 11/13/20 12/13/20 Yes Sheikh, Omair Latif, DO  doxazosin (CARDURA) 8 MG tablet Take 8 mg by mouth daily. 02/06/20  Yes [provider]  feeding supplement (ENSURE ENLIVE / ENSURE PLUS) LIQD Take 237 mLs by mouth 2 (two) times daily between meals. 11/13/20  Yes Sheikh, Omair Latif, DO  ferrous sulfate 325 (65 FE) MG tablet Take 325 mg by mouth daily.   Yes [provider]  fluconazole (DIFLUCAN) 100 MG tablet Take 1 tablet (100 mg total) by mouth daily for 14 days. 11/13/20 11/27/20 Yes Sheikh, Omair Latif, DO  hydrOXYzine (ATARAX/VISTARIL) 25 MG tablet Take 1 tablet (25 mg total) by mouth 3 (three) times daily as needed. Patient  taking differently: Take 25 mg by mouth 3 (three) times daily as needed for itching. 10/27/20  Yes Ennever, Rudell Cobb, MD  latanoprost (XALATAN) 0.005 % ophthalmic solution Place 1 drop into the left eye at bedtime. 10/01/20  Yes [provider]  LORazepam (ATIVAN) 0.5 MG tablet Take 1 tablet (0.5 mg total) by mouth every 6 (six) hours as needed for anxiety. 11/13/20  Yes Sheikh, Omair Latif, DO  metoprolol tartrate (LOPRESSOR) 25 MG tablet Take 1 tablet (25 mg total) by mouth 2 (two) times daily. 11/13/20  Yes Sheikh, Omair Latif, DO  Multiple Vitamin (MULTIVITAMIN WITH MINERALS) TABS tablet Take 1 tablet by mouth daily. 11/14/20  Yes Sheikh, Omair Latif, DO  nystatin (MYCOSTATIN) 100000 UNIT/ML suspension Take 5 mLs (500,000 Units total) by mouth 4 (four) times daily. 11/13/20  Yes Sheikh, Omair Latif, DO  oxyCODONE (OXYCONTIN) 20 mg 12 hr tablet Take 1 tablet (20 mg total) by mouth every 12 (twelve) hours. 11/13/20  Yes Sheikh, Omair Latif, DO  oxyCODONE 10 MG TABS Take 1 tablet (10 mg total) by mouth every 3 (three) hours as needed for moderate pain or breakthrough pain. 11/13/20  Yes Sheikh, Omair Latif, DO  polyethylene glycol (MIRALAX / GLYCOLAX) 17 g packet Take 17 g by mouth daily. 11/14/20  Yes Sheikh, Omair Latif, DO  simethicone (MYLICON) 80 MG chewable tablet Chew 1 tablet (80 mg total) by mouth every 6 (six) hours as needed for flatulence. 11/13/20  Yes Sheikh, Omair Latif, DO  temazepam (RESTORIL) 15 MG capsule Take 1 capsule (15 mg total) by mouth at bedtime. 11/13/20  Yes Sheikh, Omair Latif, DO  timolol (TIMOPTIC) 0.5 % ophthalmic solution Place 1 drop into both eyes daily. 01/29/20  Yes [provider]  pantoprazole (PROTONIX) 40 MG tablet Take 1 tablet (40 mg total) by mouth 2 (two) times daily before a meal. Patient not taking: Reported on 11/27/2020 11/13/20   Raiford Noble Latif, DO  senna-docusate (SENOKOT-S) 8.6-50 MG tablet Take 2 tablets by mouth 2 (two) times daily. Patient not  taking: Reported on 11/12/2020 11/13/20   Kerney Elbe, DO    Physical Exam: Vitals:   11/24/2020 1200 11/17/2020 1230 11/29/2020 1330 11/24/2020 1430  BP: 121/74 127/88 123/86 137/90  Pulse: 96 (!) 102 98 97  Resp: 13 (!) 25 16 17   Temp:      TempSrc:      SpO2: 93% 92% 94% 93%  Weight:      Height:        General: 70 y.o. male resting in bed in NAD Eyes: PERRL, normal sclera ENMT: Nares patent w/o discharge, orophaynx clear, dentition normal, ears w/o discharge/lesions/ulcers Neck: Supple, trachea midline Cardiovascular: RRR, +S1, S2, no m/g/r, equal pulses throughout Respiratory: CTABL, no w/r/r, normal WOB GI: BS+, NDNT, no masses noted, no organomegaly noted MSK: No e/c/c, limited right hip ROM d/t pain Skin: No rashes, bruises, ulcerations noted Neuro: A&O x 3, no focal  deficits Psyc: Appropriate interaction and affect, calm/cooperative  Labs on Admission: I have personally reviewed following labs and imaging studies  CBC: Recent Labs  Lab 11/12/20 0556 11/13/20 0610 12/02/2020 1111  WBC 20.5* 20.2* 30.5*  NEUTROABS 17.4* 16.7* 27.1*  HGB 10.0* 10.1* 11.2*  HCT 33.2* 33.2* 36.1*  MCV 84.7 84.1 82.8  PLT 279 265 742   Basic Metabolic Panel: Recent Labs  Lab 11/12/20 0556 11/13/20 0610 11/27/2020 1111  NA 132* 131* 128*  K 4.9 4.7 5.4*  CL 96* 95* 93*  CO2 28 29 26   GLUCOSE 142* 128* 170*  BUN 28* 27* 42*  CREATININE 0.61 0.50* 0.76  CALCIUM 7.8* 7.7* 7.5*   GFR: Estimated Creatinine Clearance: 106.1 mL/min (by C-G formula based on SCr of 0.76 mg/dL). Liver Function Tests: Recent Labs  Lab 11/12/20 0556 11/13/20 0610 11/13/2020 1111  AST 37 30 38  ALT 43 42 55*  ALKPHOS 114 106 128*  BILITOT 0.4 0.7 0.6  PROT 5.5* 5.3* 5.8*  ALBUMIN 2.6* 2.6* 2.9*   No results for input(s): LIPASE, AMYLASE in the last 168 hours. No results for input(s): AMMONIA in the last 168 hours. Coagulation Profile: Recent Labs  Lab 11/28/2020 1111  INR 1.1   Cardiac  Enzymes: No results for input(s): CKTOTAL, CKMB, CKMBINDEX, TROPONINI in the last 168 hours. BNP (last 3 results) No results for input(s): PROBNP in the last 8760 hours. HbA1C: No results for input(s): HGBA1C in the last 72 hours. CBG: No results for input(s): GLUCAP in the last 168 hours. Lipid Profile: No results for input(s): CHOL, HDL, LDLCALC, TRIG, CHOLHDL, LDLDIRECT in the last 72 hours. Thyroid Function Tests: No results for input(s): TSH, T4TOTAL, FREET4, T3FREE, THYROIDAB in the last 72 hours. Anemia Panel: No results for input(s): VITAMINB12, FOLATE, FERRITIN, TIBC, IRON, RETICCTPCT in the last 72 hours. Urine analysis:    Component Value Date/Time   COLORURINE YELLOW 12/04/2020 1213   APPEARANCEUR HAZY (A) 11/16/2020 1213   LABSPEC 1.029 12/08/2020 1213   PHURINE 5.0 12/05/2020 1213   GLUCOSEU 50 (A) 12/05/2020 1213   HGBUR NEGATIVE 12/01/2020 1213   BILIRUBINUR NEGATIVE 11/14/2020 1213   KETONESUR 5 (A) 12/10/2020 1213   PROTEINUR NEGATIVE 11/29/2020 1213   NITRITE NEGATIVE 12/03/2020 1213   LEUKOCYTESUR NEGATIVE 11/14/2020 1213    Radiological Exams on Admission: DG Chest Port 1 View  Result Date: 11/21/2020 CLINICAL DATA:  Weakness.  Post radiation treatment yesterday. EXAM: PORTABLE CHEST 1 VIEW COMPARISON:  Chest CT Nov 05, 2020 FINDINGS: Injectable port terminates at the cavoatrial junction. The cardiac silhouette is normal. Enlarging left pleural based medial soft tissue mass versus mediastinal mass. No evidence of focal airspace consolidation. Osseous structures are without acute abnormality. Soft tissues are grossly normal. IMPRESSION: Enlarging left pleural based medial soft tissue mass versus mediastinal mass. Electronically Signed   By: Fidela Salisbury M.D.   On: 12/10/2020 12:22   DG Hip Unilat W or Wo Pelvis 2-3 Views Right  Result Date: 11/14/2020 CLINICAL DATA:  Right hip pain. Metastatic melanoma. Currently receiving radiation to this area. EXAM: DG  HIP (WITH OR WITHOUT PELVIS) 2-3V RIGHT COMPARISON:  10/13/2020 plain film.  CT 11/05/2020. FINDINGS: AP view the pelvis and AP/frog leg views of the right hip. Bowel gas overlies the right iliac crest and medial superior iliac bone, at the site of a permeative lesions on the prior CT. Femoral heads are located. Sacroiliac joints are symmetric. No acute fracture. IMPRESSION: No acute osseous abnormality. Lytic lesions involving  the right ilium are not well evaluated secondary to overlying bowel gas. Electronically Signed   By: Abigail Miyamoto M.D.   On: 11/28/2020 13:52    EKG: Independently reviewed. 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 Marin Olp (notified of admission by secure chat)     - continue home regimen, add PRN meds  Hyperkalemia     - fluids, will give OT lokelma; follow labs  Elevated lactic acid Leukocytosis     - continue fluids; follow lactic acid     - UA is negative, CXR w/o new airspace disease     - he has no fever, vitals are otherwise stable     - UCx and Bld Cx sent; he doesn't have the clinical appearance of infection     - he has significant metastasis and is on high dose steroids; I think right now we can hold on abx and wait for cultures to show something  Hyponatremia     - fluids, follow  DVT prophylaxis: SCDs  Code Status: DNR  Family Communication: w/ wife at bedside  Consults called: None   Status is: Observation  The patient remains OBS appropriate and will d/c before 2 midnights.  Dispo: The patient is from: Home              Anticipated d/c is to: Home              Patient currently is not medically stable to d/c.   Difficult  to place patient No  Time spent coordinating admission: 70 minutes  [facility]  If 7PM-7AM, please contact night-coverage [email or web address]  11/19/2020, 2:57 PM

## 2020-11-18 NOTE — Telephone Encounter (Signed)
Call received from patient's wife to inform Dr. Marin Olp that the on call provider was called this morning d/t pt is experiencing "excruciating pain to his right hip," was instructed to call 911 now for assistance and that ambulance is currently at patient's house. Dr. Marin Olp notified.

## 2020-11-18 NOTE — ED Triage Notes (Signed)
Pt BIB [facility] from home. Hx metastatic cancer from hip to lung. Pt c/o worsening pain and fatigue. No urine output since very little yesterday morning. Ambulates with 2 assist. A&Ox3. VSS. Wife gave him 1 pain pill at 0930. Denies urge to urinate. Denies change in appetite.  BP 140/86 RR 20 HR 100 SpO2 93% RA

## 2020-11-18 NOTE — ED Provider Notes (Signed)
Gary Young DEPT Provider Note   CSN: [id number] Arrival date & time: 11/28/2020  0950     History Chief Complaint  Patient presents with  . Weakness    Gary Young is a 70 y.o. male.  70 year old male with prior medical history as detailed below presents for evaluation.  Patient with advanced metastatic melanoma.  Patient with recent admission for pain control.  Patient was discharged on 6/3.  Since discharge the patient has been able to ambulate around his house with significant assistance with use of a walker.  Over the course of the last 24 hours the patient has had increasing pain to the right hip.  Patient with decreased p.o. intake as well.  Patient apparently was able to make it to radiation therapy yesterday morning.  After this therapy session he was bedbound.  Patient arrives by EMS for evaluation of reported pain and decreased p.o. intake.  The history is provided by the patient, medical records and the spouse.  Weakness Severity:  Moderate Onset quality:  Gradual Duration:  1 day Timing:  Constant Progression:  Worsening Chronicity:  Recurrent Relieved by:  Nothing Worsened by:  Nothing      Past Medical History:  Diagnosis Date  . Asthma   . Cancer [facility])     Patient Active Problem List   Diagnosis Date Noted  . 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 Brain metastases ([place]) 11/10/2020  . Bone metastasis ([place]) 11/10/2020  . Liver masses   . Mass in neck   . 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 Malignant melanoma ([place])   . Goals of care, counseling/discussion   . Palliative care by specialist   . Intractable pain 11/04/2020    Past Surgical History:  Procedure Laterality Date  . BACK SURGERY    . BIOPSY  11/11/2020   Procedure: BIOPSY;  Surgeon: Otis Brace, MD;  Location: [facility] ENDOSCOPY;  Service: Gastroenterology;;  . ESOPHAGOGASTRODUODENOSCOPY N/A 11/11/2020   Procedure: ESOPHAGOGASTRODUODENOSCOPY (EGD);  Surgeon: Otis Brace, MD;  Location: [facility];  Service: Gastroenterology;  Laterality: N/A;  . EYE SURGERY    . IR IMAGING GUIDED PORT INSERTION  11/05/2020  . IR US GUIDE BX ASP/DRAIN  11/05/2020  . SKIN BIOPSY    . TONSILLECTOMY         No family history on file.  Social History   Tobacco Use  . Smoking status: Never Smoker  . Smokeless tobacco: Never Used  Vaping Use  . Vaping Use: Never used  Substance Use Topics  . Alcohol use: Yes    Alcohol/week: 2.0 standard drinks    Types: 1 Glasses of wine, 1 Cans of beer per week    Comment: Socially  . Drug use: Never    Home Medications Prior to Admission medications   Medication Sig Start Date End Date Taking? Authorizing Provider  acetaminophen (TYLENOL) 325 MG tablet Take 2 tablets (650 mg total) by mouth every 6 (six) hours as needed for mild pain, fever or headache. 11/13/20  Yes Sheikh, Omair Latif, DO  albuterol (VENTOLIN HFA) 108 (90 Base) MCG/ACT inhaler Inhale 1-2 puffs into the lungs every 6 (six) hours as needed for wheezing or shortness of breath. 10/01/20  Yes [provider]  calcium carbonate (TUMS - DOSED IN MG ELEMENTAL CALCIUM) 500 MG chewable tablet Chew 1 tablet by mouth 3 (three) times daily as needed for indigestion or heartburn.   Yes [provider]  Cholecalciferol (VITAMIN D) 50 MCG (  2000 UT) tablet Take 2,000 Units by mouth daily.   Yes [provider]  cyanocobalamin 1000 MCG tablet Take 1,000 mcg by mouth daily.   Yes [provider]  dexamethasone (DECADRON) 6 MG tablet Take 2 tablets (12 mg total) by mouth every 6 (six) hours. Patient taking differently: Take 12 mg by mouth 4 (four) times daily. 11/13/20 12/13/20 Yes Sheikh, Omair Latif, DO  doxazosin (CARDURA) 8 MG tablet Take 8 mg by mouth daily. 02/06/20  Yes [provider]  feeding supplement (ENSURE ENLIVE / ENSURE PLUS) LIQD Take 237 mLs by mouth 2 (two) times daily between meals. 11/13/20  Yes Sheikh, Omair Latif, DO   ferrous sulfate 325 (65 FE) MG tablet Take 325 mg by mouth daily.   Yes [provider]  fluconazole (DIFLUCAN) 100 MG tablet Take 1 tablet (100 mg total) by mouth daily for 14 days. 11/13/20 11/27/20 Yes Sheikh, Omair Latif, DO  hydrOXYzine (ATARAX/VISTARIL) 25 MG tablet Take 1 tablet (25 mg total) by mouth 3 (three) times daily as needed. Patient taking differently: Take 25 mg by mouth 3 (three) times daily as needed for itching. 10/27/20  Yes Ennever, Rudell Cobb, MD  latanoprost (XALATAN) 0.005 % ophthalmic solution Place 1 drop into the left eye at bedtime. 10/01/20  Yes [provider]  LORazepam (ATIVAN) 0.5 MG tablet Take 1 tablet (0.5 mg total) by mouth every 6 (six) hours as needed for anxiety. 11/13/20  Yes Sheikh, Omair Latif, DO  metoprolol tartrate (LOPRESSOR) 25 MG tablet Take 1 tablet (25 mg total) by mouth 2 (two) times daily. 11/13/20  Yes Sheikh, Omair Latif, DO  Multiple Vitamin (MULTIVITAMIN WITH MINERALS) TABS tablet Take 1 tablet by mouth daily. 11/14/20  Yes Sheikh, Omair Latif, DO  nystatin (MYCOSTATIN) 100000 UNIT/ML suspension Take 5 mLs (500,000 Units total) by mouth 4 (four) times daily. 11/13/20  Yes Sheikh, Omair Latif, DO  oxyCODONE (OXYCONTIN) 20 mg 12 hr tablet Take 1 tablet (20 mg total) by mouth every 12 (twelve) hours. 11/13/20  Yes Sheikh, Omair Latif, DO  oxyCODONE 10 MG TABS Take 1 tablet (10 mg total) by mouth every 3 (three) hours as needed for moderate pain or breakthrough pain. 11/13/20  Yes Sheikh, Omair Latif, DO  polyethylene glycol (MIRALAX / GLYCOLAX) 17 g packet Take 17 g by mouth daily. 11/14/20  Yes Sheikh, Omair Latif, DO  simethicone (MYLICON) 80 MG chewable tablet Chew 1 tablet (80 mg total) by mouth every 6 (six) hours as needed for flatulence. 11/13/20  Yes Sheikh, Omair Latif, DO  temazepam (RESTORIL) 15 MG capsule Take 1 capsule (15 mg total) by mouth at bedtime. 11/13/20  Yes Sheikh, Omair Latif, DO  timolol (TIMOPTIC) 0.5 % ophthalmic solution Place  1 drop into both eyes daily. 01/29/20  Yes [provider]  pantoprazole (PROTONIX) 40 MG tablet Take 1 tablet (40 mg total) by mouth 2 (two) times daily before a meal. Patient not taking: Reported on 12/07/2020 11/13/20   Raiford Noble Latif, DO  senna-docusate (SENOKOT-S) 8.6-50 MG tablet Take 2 tablets by mouth 2 (two) times daily. Patient not taking: Reported on 12/04/2020 11/13/20   Kerney Elbe, DO    Allergies    Patient has no known allergies.  Review of Systems   Review of Systems  Neurological: Positive for weakness.  All other systems reviewed and are negative.   Physical Exam Updated Vital Signs BP 127/88   Pulse (!) 102   Temp 98 F (36.7 C) (Oral)  Resp (!) 25   Ht 6' (1.829 m)   Wt 105.2 kg   SpO2 92%   BMI 31.46 kg/m   Physical Exam Vitals and nursing note reviewed.  Constitutional:      General: He is not in acute distress.    Appearance: Normal appearance. He is well-developed.  HENT:     Head: Normocephalic and atraumatic.     Mouth/Throat:     Mouth: Mucous membranes are dry.  Eyes:     Conjunctiva/sclera: Conjunctivae normal.     Pupils: Pupils are equal, round, and reactive to light.  Cardiovascular:     Rate and Rhythm: Regular rhythm. Tachycardia present.     Heart sounds: Normal heart sounds.  Pulmonary:     Effort: Pulmonary effort is normal. No respiratory distress.     Breath sounds: Normal breath sounds.  Abdominal:     General: There is no distension.     Palpations: Abdomen is soft.     Tenderness: There is no abdominal tenderness.  Musculoskeletal:        General: No deformity. Normal range of motion.     Cervical back: Normal range of motion and neck supple.  Skin:    General: Skin is warm and dry.  Neurological:     General: No focal deficit present.     Mental Status: He is alert and oriented to person, place, and time.     ED Results / Procedures / Treatments   Labs (all labs ordered are listed, but only  abnormal results are displayed) Labs Reviewed  LACTIC ACID, PLASMA - Abnormal; Notable for the following components:      Result Value   Lactic Acid, Venous 3.3 (*)    All other components within normal limits  COMPREHENSIVE METABOLIC PANEL - Abnormal; Notable for the following components:   Sodium 128 (*)    Potassium 5.4 (*)    Chloride 93 (*)    Glucose, Bld 170 (*)    BUN 42 (*)    Calcium 7.5 (*)    Total Protein 5.8 (*)    Albumin 2.9 (*)    ALT 55 (*)    Alkaline Phosphatase 128 (*)    All other components within normal limits  CBC WITH DIFFERENTIAL/PLATELET - Abnormal; Notable for the following components:   WBC 30.5 (*)    Hemoglobin 11.2 (*)    HCT 36.1 (*)    MCH 25.7 (*)    RDW 18.4 (*)    Neutro Abs 27.1 (*)    Lymphs Abs 0.4 (*)    Monocytes Absolute 2.2 (*)    Abs Immature Granulocytes 0.77 (*)    All other components within normal limits  URINALYSIS, ROUTINE W REFLEX MICROSCOPIC - Abnormal; Notable for the following components:   APPearance HAZY (*)    Glucose, UA 50 (*)    Ketones, ur 5 (*)    All other components within normal limits  RESP PANEL BY RT-PCR (FLU A&B, COVID) ARPGX2  CULTURE, BLOOD (ROUTINE X 2)  CULTURE, BLOOD (ROUTINE X 2)  PROTIME-INR  LACTIC ACID, PLASMA  TYPE AND SCREEN  ABO/RH  TROPONIN I (HIGH SENSITIVITY)  TROPONIN I (HIGH SENSITIVITY)    EKG EKG Interpretation  Date/Time:  Wednesday November 18 2020 10:29:45 EDT Ventricular Rate:  107 PR Interval:  150 QRS Duration: 84 QT Interval:  334 QTC Calculation: 446 R Axis:   -14 Text Interpretation: Sinus tachycardia Confirmed by Dene Gentry [phone]) on 11/20/2020 10:46:47 AM  Radiology DG Chest Port 1 View  Result Date: 12/04/2020 CLINICAL DATA:  Weakness.  Post radiation treatment yesterday. EXAM: PORTABLE CHEST 1 VIEW COMPARISON:  Chest CT Nov 05, 2020 FINDINGS: Injectable port terminates at the cavoatrial junction. The cardiac silhouette is normal. Enlarging left pleural  based medial soft tissue mass versus mediastinal mass. No evidence of focal airspace consolidation. Osseous structures are without acute abnormality. Soft tissues are grossly normal. IMPRESSION: Enlarging left pleural based medial soft tissue mass versus mediastinal mass. Electronically Signed   By: Fidela Salisbury M.D.   On: 12/04/2020 12:22    Procedures Procedures   Medications Ordered in ED Medications  sodium chloride 0.9 % bolus 1,000 mL (1,000 mLs Intravenous New Bag/Given 11/11/2020 1120)    ED Course  I have reviewed the triage vital signs and the nursing notes.  Pertinent labs & imaging results that were available during my care of the patient were reviewed by me and considered in my medical decision making (see chart for details).    MDM Rules/Calculators/A&P                          MDM  MSE complete  Gary Young was evaluated in Emergency Department on 11/14/2020 for the symptoms described in the history of present illness. He was evaluated in the context of the global COVID-19 pandemic, which necessitated consideration that the patient might be at risk for infection with the SARS-CoV-2 virus that causes COVID-19. Institutional protocols and algorithms that pertain to the evaluation of patients at risk for COVID-19 are in a state of rapid change based on information released by regulatory bodies including the CDC and federal and state organizations. These policies and algorithms were followed during the patient's care in the ED.  Patient presented with complaint of weakness with worsening right hip pain.  Patient with longstanding known history of metastatic melanoma.  Patient with recent admission with discharge on 6/3.  Patient apparently was able to ambulate with walker until yesterday morning.  Patient with significant weakness and inability to safely ambulate at home thereafter.  Patient with evidence of dehydration on exam and on work-up.  Patient would likely  benefit from admission for further work-up and treatment.  Hospitalist service is aware of case and will evaluate for same.   Final Clinical Impression(s) / ED Diagnoses Final diagnoses:  Weakness  Dehydration    Rx / DC Orders ED Discharge Orders    None       Valarie Merino, MD 11/15/2020 1451

## 2020-11-19 ENCOUNTER — Ambulatory Visit
Admit: 2020-11-19 | Discharge: 2020-11-19 | Disposition: A | Discharge status: Home / Self Care | Payer: Medicare HMO | Attending: Radiation Oncology | Admitting: Radiation Oncology

## 2020-11-19 ENCOUNTER — Observation Stay (HOSPITAL_COMMUNITY): Payer: Medicare HMO

## 2020-11-19 DIAGNOSIS — R339 Retention of urine, unspecified: Secondary | ICD-10-CM | POA: Diagnosis not present

## 2020-11-19 DIAGNOSIS — Z515 Encounter for palliative care: Secondary | ICD-10-CM | POA: Diagnosis not present

## 2020-11-19 DIAGNOSIS — Z20822 Contact with and (suspected) exposure to covid-19: Secondary | ICD-10-CM | POA: Diagnosis present

## 2020-11-19 DIAGNOSIS — Z66 Do not resuscitate: Secondary | ICD-10-CM | POA: Diagnosis present

## 2020-11-19 DIAGNOSIS — Z7401 Bed confinement status: Secondary | ICD-10-CM | POA: Diagnosis not present

## 2020-11-19 DIAGNOSIS — M25551 Pain in right hip: Secondary | ICD-10-CM | POA: Diagnosis present

## 2020-11-19 DIAGNOSIS — C799 Secondary malignant neoplasm of unspecified site: Secondary | ICD-10-CM | POA: Diagnosis not present

## 2020-11-19 DIAGNOSIS — C7931 Secondary malignant neoplasm of brain: Secondary | ICD-10-CM | POA: Diagnosis present

## 2020-11-19 DIAGNOSIS — E871 Hypo-osmolality and hyponatremia: Secondary | ICD-10-CM | POA: Diagnosis present

## 2020-11-19 DIAGNOSIS — R52 Pain, unspecified: Secondary | ICD-10-CM | POA: Diagnosis not present

## 2020-11-19 DIAGNOSIS — R338 Other retention of urine: Secondary | ICD-10-CM | POA: Diagnosis present

## 2020-11-19 DIAGNOSIS — Z923 Personal history of irradiation: Secondary | ICD-10-CM | POA: Diagnosis not present

## 2020-11-19 DIAGNOSIS — R7989 Other specified abnormal findings of blood chemistry: Secondary | ICD-10-CM | POA: Diagnosis present

## 2020-11-19 DIAGNOSIS — C7951 Secondary malignant neoplasm of bone: Secondary | ICD-10-CM | POA: Diagnosis present

## 2020-11-19 DIAGNOSIS — C7800 Secondary malignant neoplasm of unspecified lung: Secondary | ICD-10-CM

## 2020-11-19 DIAGNOSIS — C439 Malignant melanoma of skin, unspecified: Secondary | ICD-10-CM

## 2020-11-19 DIAGNOSIS — C787 Secondary malignant neoplasm of liver and intrahepatic bile duct: Secondary | ICD-10-CM | POA: Diagnosis not present

## 2020-11-19 DIAGNOSIS — Z79899 Other long term (current) drug therapy: Secondary | ICD-10-CM | POA: Diagnosis not present

## 2020-11-19 DIAGNOSIS — G9340 Encephalopathy, unspecified: Secondary | ICD-10-CM | POA: Diagnosis present

## 2020-11-19 DIAGNOSIS — E860 Dehydration: Secondary | ICD-10-CM | POA: Diagnosis present

## 2020-11-19 DIAGNOSIS — R262 Difficulty in walking, not elsewhere classified: Secondary | ICD-10-CM | POA: Diagnosis present

## 2020-11-19 DIAGNOSIS — G893 Neoplasm related pain (acute) (chronic): Secondary | ICD-10-CM | POA: Diagnosis present

## 2020-11-19 DIAGNOSIS — R109 Unspecified abdominal pain: Secondary | ICD-10-CM | POA: Diagnosis present

## 2020-11-19 DIAGNOSIS — D630 Anemia in neoplastic disease: Secondary | ICD-10-CM | POA: Diagnosis present

## 2020-11-19 DIAGNOSIS — J45909 Unspecified asthma, uncomplicated: Secondary | ICD-10-CM | POA: Diagnosis present

## 2020-11-19 DIAGNOSIS — Z7189 Other specified counseling: Secondary | ICD-10-CM | POA: Diagnosis not present

## 2020-11-19 DIAGNOSIS — D72829 Elevated white blood cell count, unspecified: Secondary | ICD-10-CM | POA: Diagnosis present

## 2020-11-19 DIAGNOSIS — C779 Secondary and unspecified malignant neoplasm of lymph node, unspecified: Secondary | ICD-10-CM

## 2020-11-19 DIAGNOSIS — R531 Weakness: Secondary | ICD-10-CM | POA: Diagnosis not present

## 2020-11-19 DIAGNOSIS — F419 Anxiety disorder, unspecified: Secondary | ICD-10-CM | POA: Diagnosis present

## 2020-11-19 DIAGNOSIS — E875 Hyperkalemia: Secondary | ICD-10-CM | POA: Diagnosis present

## 2020-11-19 DIAGNOSIS — D509 Iron deficiency anemia, unspecified: Secondary | ICD-10-CM | POA: Diagnosis present

## 2020-11-19 LAB — COMPREHENSIVE METABOLIC PANEL
ALT: 53 U/L — ABNORMAL HIGH (ref 0–44)
AST: 39 U/L (ref 15–41)
Albumin: 2.5 g/dL — ABNORMAL LOW (ref 3.5–5.0)
Alkaline Phosphatase: 112 U/L (ref 38–126)
Anion gap: 7 (ref 5–15)
BUN: 38 mg/dL — ABNORMAL HIGH (ref 8–23)
CO2: 24 mmol/L (ref 22–32)
Calcium: 6.9 mg/dL — ABNORMAL LOW (ref 8.9–10.3)
Chloride: 98 mmol/L (ref 98–111)
Creatinine, Ser: 0.7 mg/dL (ref 0.61–1.24)
GFR, Estimated: >60 mL/min (ref >60)
Glucose, Bld: 178 mg/dL — ABNORMAL HIGH (ref 70–99)
Potassium: 5.2 mmol/L — ABNORMAL HIGH (ref 3.5–5.1)
Sodium: 129 mmol/L — ABNORMAL LOW (ref 135–145)
Total Bilirubin: 0.5 mg/dL (ref 0.3–1.2)
Total Protein: 5 g/dL — ABNORMAL LOW (ref 6.5–8.1)

## 2020-11-19 LAB — CBC
HCT: 33.1 % — ABNORMAL LOW (ref 39.0–52.0)
Hemoglobin: 10.3 g/dL — ABNORMAL LOW (ref 13.0–17.0)
MCH: 26 pg (ref 26.0–34.0)
MCHC: 31.1 g/dL (ref 30.0–36.0)
MCV: 83.6 fL (ref 80.0–100.0)
Platelets: 225 10*3/uL (ref 150–400)
RBC: 3.96 MIL/uL — ABNORMAL LOW (ref 4.22–5.81)
RDW: 18.6 % — ABNORMAL HIGH (ref 11.5–15.5)
WBC: 29.1 10*3/uL — ABNORMAL HIGH (ref 4.0–10.5)
nRBC: 0 % (ref 0.0–0.2)

## 2020-11-19 LAB — GLUCOSE, CAPILLARY
Glucose-Capillary: 165 mg/dL — ABNORMAL HIGH (ref 70–99)
Glucose-Capillary: 171 mg/dL — ABNORMAL HIGH (ref 70–99)
Glucose-Capillary: 174 mg/dL — ABNORMAL HIGH (ref 70–99)

## 2020-11-19 IMAGING — US US RENAL
1 series · 14 of 25 positions shown · non-contrast
Comparison: CT chest, abdomen, and pelvis dated [DATE].

CLINICAL DATA: Urinary retention.

EXAM:
RENAL / URINARY TRACT ULTRASOUND COMPLETE

[Series 1: us renal · 14 of 53 slices shown]
[im 1/53]
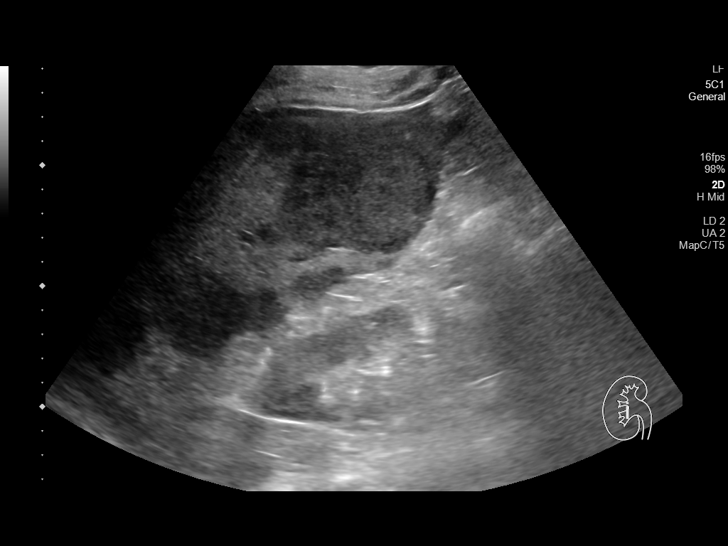
[im 5/53]
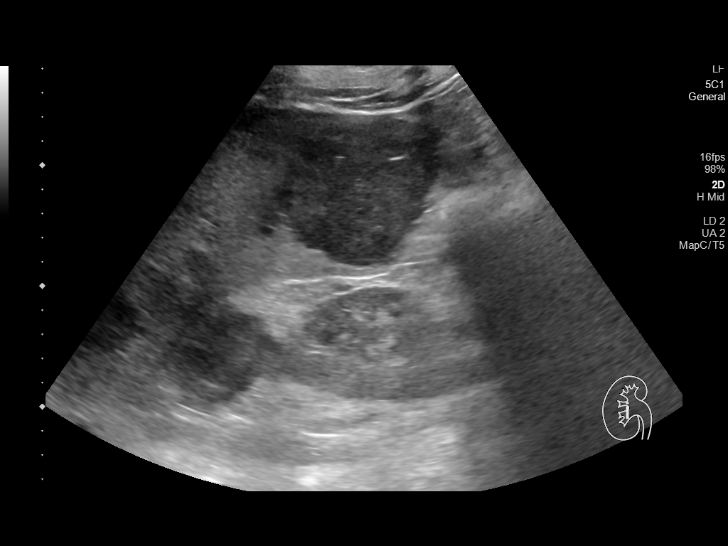
[im 9/53]
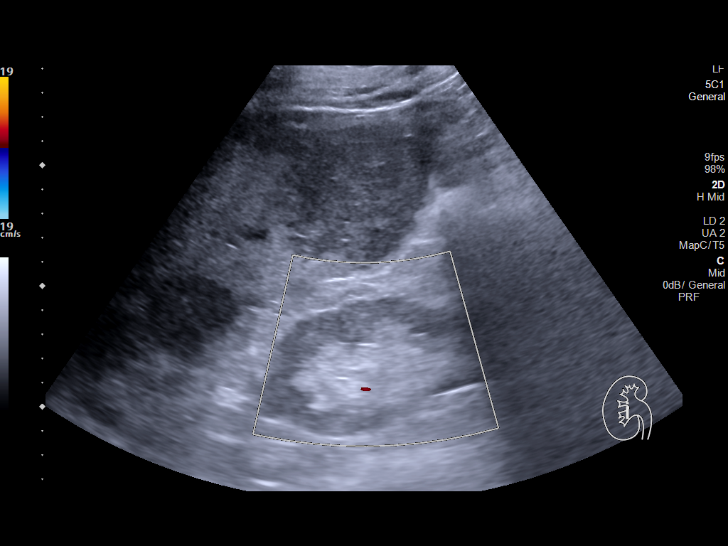
[im 14/53]
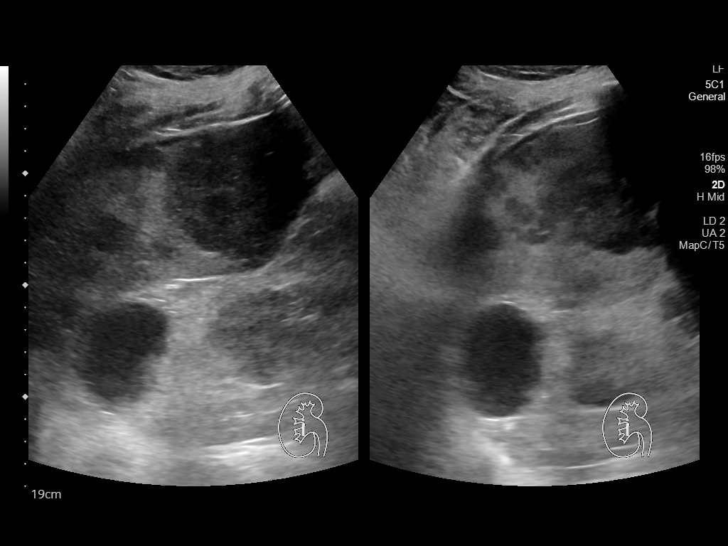
[im 18/53]
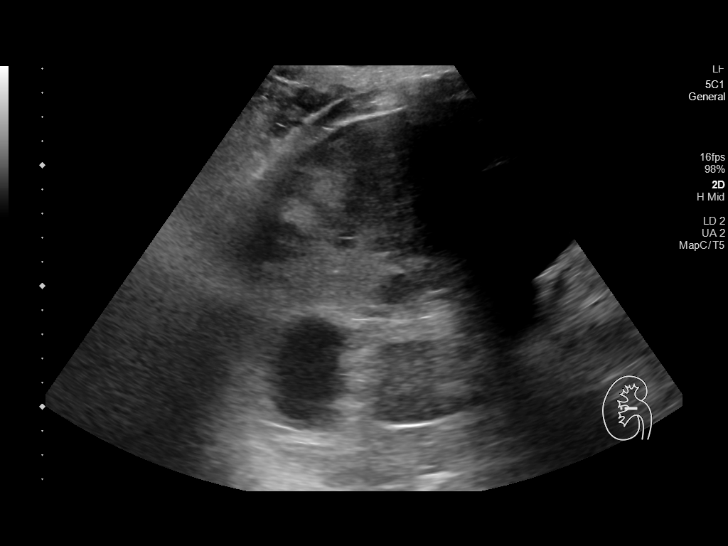
[im 20/53]
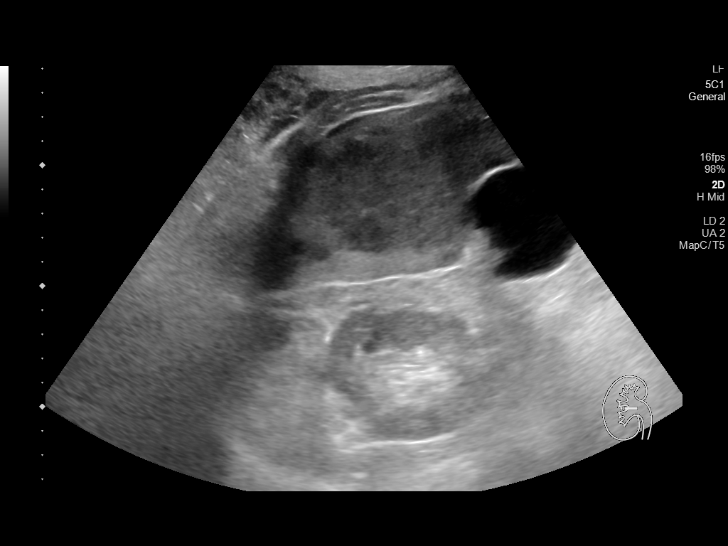
[im 24/53]
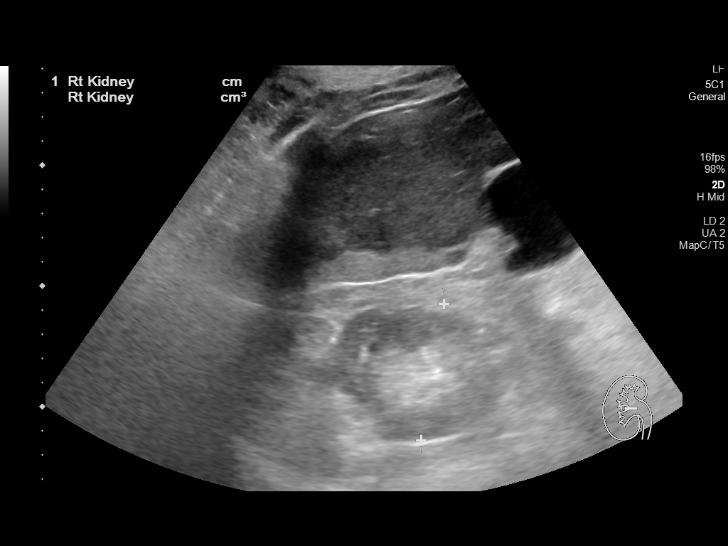
[im 29/53]
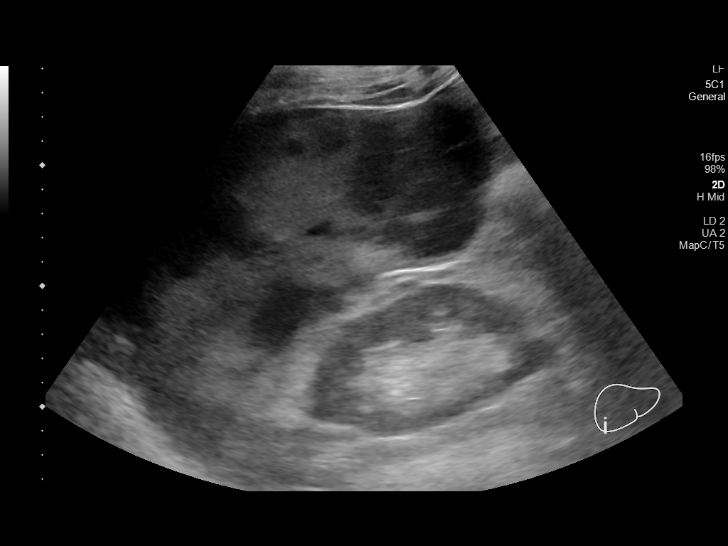
[im 33/53]
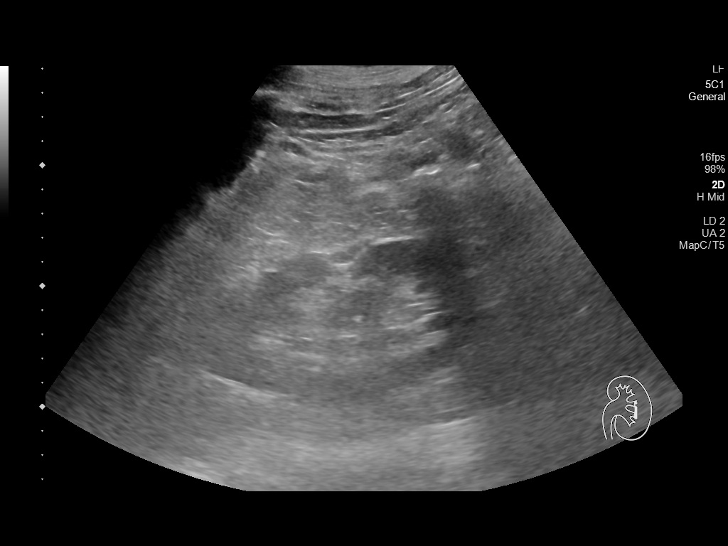
[im 35/53]
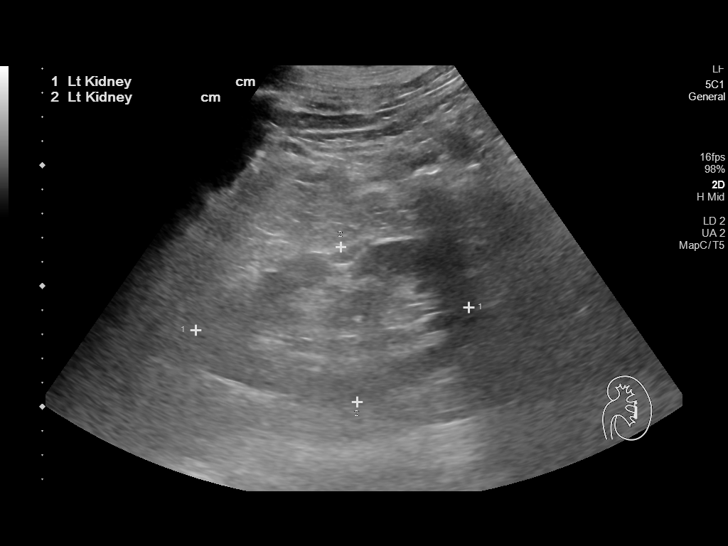
[im 40/53]
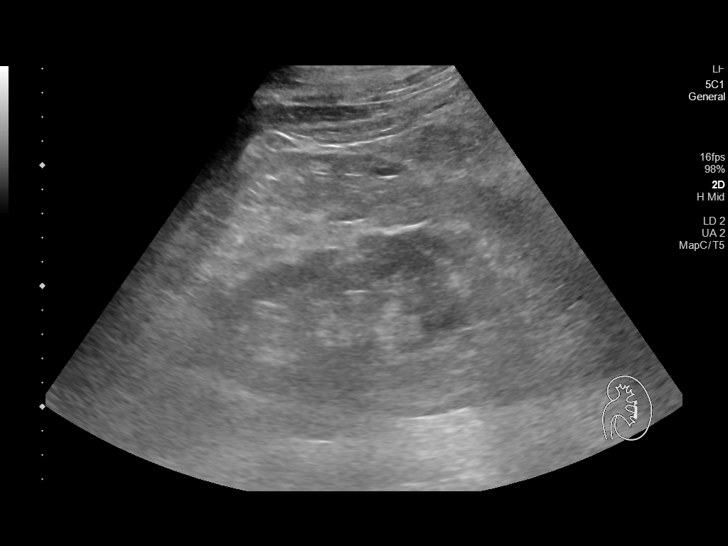
[im 44/53]
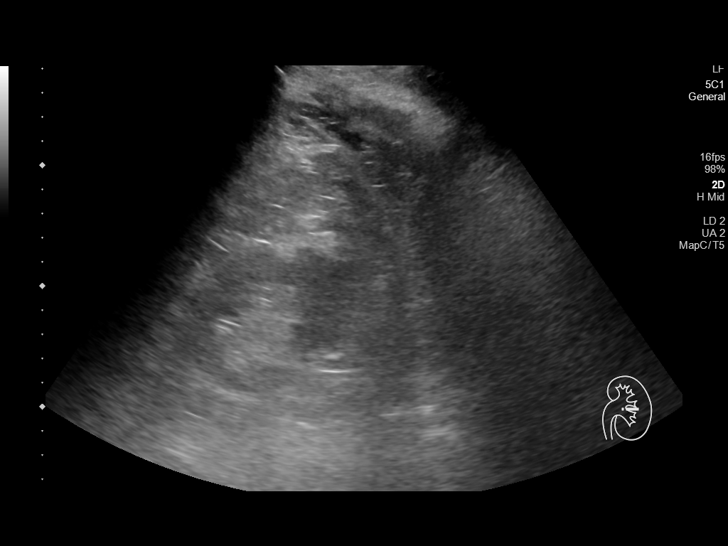
[im 48/53]
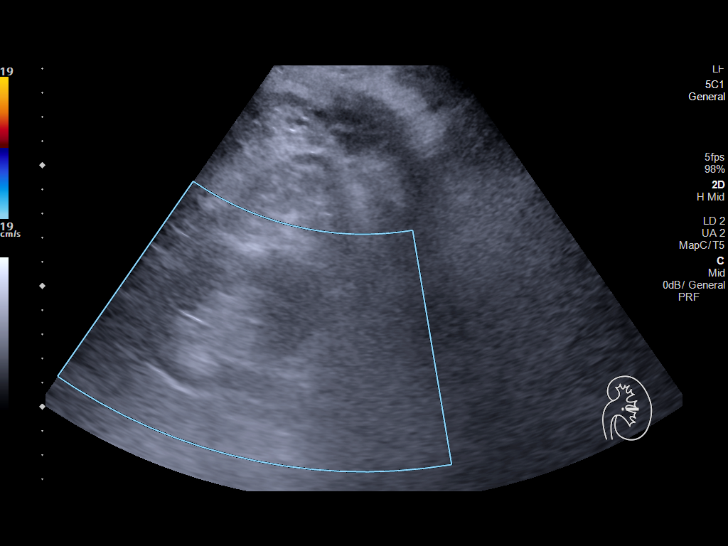
[im 53/53]
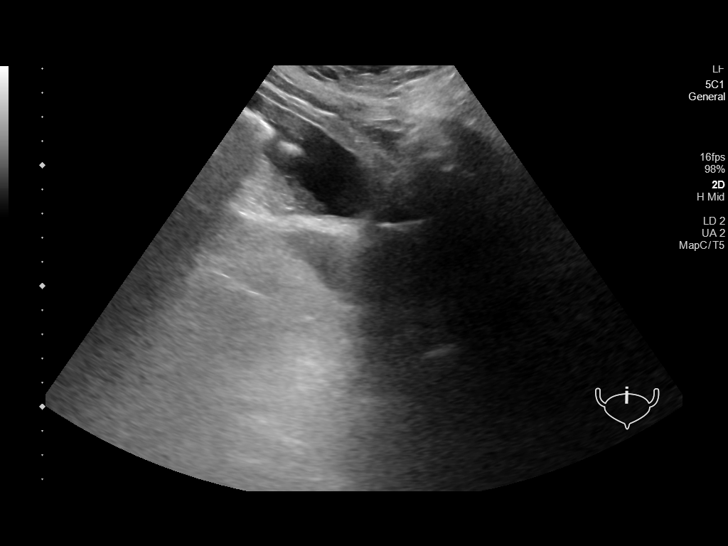

[14 of 25 positions shown; findings below may reference images not displayed]

FINDINGS: Right Kidney:

Renal measurements: 11.9 x 5.1 x 5.7 cm = volume: 183 mL.
Echogenicity within normal limits. No mass or hydronephrosis
visualized. 4.9 cm cyst in the upper pole.

Left Kidney:

Renal measurements: 11.4 x 6.5 x 6.7 cm = volume: 256 mL.
Echogenicity within normal limits. No mass or hydronephrosis
visualized.

Bladder:

Decompressed by Foley catheter.

Other:

Heterogeneous liver with multiple hypoechoic masses.
IMPRESSION: 1. No acute abnormality.
2. Hepatic metastatic disease again noted.

## 2020-11-19 MED ORDER — CHLORHEXIDINE GLUCONATE CLOTH 2 % EX PADS
6.0000 | MEDICATED_PAD | Freq: Every day | CUTANEOUS | Status: DC
  Administered 2020-11-19 – 2020-11-21 (×3): 6 via TOPICAL

## 2020-11-19 MED ORDER — FINASTERIDE 5 MG PO TABS
5.0000 mg | ORAL_TABLET | Freq: Every day | ORAL | Status: DC
  Administered 2020-11-19 – 2020-11-21 (×3): 5 mg via ORAL
  Filled 2020-11-19 (×3): qty 1

## 2020-11-19 MED ORDER — SODIUM CHLORIDE 0.9% FLUSH: 10.0000 mL | INTRAVENOUS | Status: DC | PRN

## 2020-11-19 MED ORDER — CALCIUM GLUCONATE-NACL 1-0.675 GM/50ML-% IV SOLN
1.0000 g | Freq: Once | INTRAVENOUS | Status: AC
  Administered 2020-11-19: 1000 mg via INTRAVENOUS
  Filled 2020-11-19: qty 50

## 2020-11-19 MED ORDER — SODIUM ZIRCONIUM CYCLOSILICATE 10 G PO PACK
10.0000 g | PACK | Freq: Once | ORAL | Status: AC
  Administered 2020-11-19: 10 g via ORAL
  Filled 2020-11-19: qty 1

## 2020-11-19 MED ORDER — ENSURE ENLIVE PO LIQD
237.0000 mL | Freq: Two times a day (BID) | ORAL | Status: DC
  Administered 2020-11-19 – 2020-11-20 (×3): 237 mL via ORAL

## 2020-11-19 MED ORDER — PROSOURCE PLUS PO LIQD
30.0000 mL | Freq: Every day | ORAL | Status: DC
  Administered 2020-11-19 – 2020-11-20 (×2): 30 mL via ORAL
  Filled 2020-11-19 (×2): qty 30

## 2020-11-19 NOTE — Plan of Care (Signed)

## 2020-11-19 NOTE — Progress Notes (Signed)
PROGRESS NOTE    Gary Young  QIH:[id number] DOB: Oct 14, 1950 DOA: 11/14/2020 PCP: [name], MD   Brief Narrative:   Gary Young is a 70 y.o. male with medical history significant of metastatic melanoma, chronic pain, iron deficiency anemia. Presenting with right hip pain. Found to be dehydrated w/ electrolyte disturbances. Some confusion ON and urinary retention. He is already on cardura. Will add proscar and place foley. To continue XRT while here. Ca2+ is now low. Will replace with IV Ca2+ today. Also likely contributing to his weakness.    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 Marin Olp (notified of admission by secure chat)     - continue home regimen, add PRN meds     - to continue XRT while here   Hyperkalemia     - still mildly elevated today     - continue fluids and give another OT dose of lokelma  Hypocalcemia     - add Ca2+ IV, follow   Elevated lactic acid Leukocytosis     - continue fluids; follow lactic acid     - UA is negative, CXR w/o new airspace disease     - he has no fever, vitals are otherwise stable     - UCx and Bld Cx sent; he doesn't have the clinical appearance of infection     - he has significant metastasis and is on high dose steroids; I think right now we can hold on abx and wait for cultures to show something     - 6/9: still not really giving the impression of infection, WBC improving    Hyponatremia     - continue fluids as above  BPH Urinary retention     - bladder scans showing elevated volumes; he's been I&O'd once already and still retaining     - check renal/bladder US     - continue cardura, add proscar     - place  foley  Acute encephalopathy     - etiology unknown     - some confusion ON and is a little slow this morning     - follow sleep/wake cycles; try to keep active during the day  DVT prophylaxis: SCDs Code Status: DNR Family Communication: w/ wife, son in room   Status is: Inpatient  Remains inpatient appropriate because:Inpatient level of care appropriate due to severity of illness  Dispo: The patient is from: Home              Anticipated d/c is to: Home              Patient currently is not medically stable to d/c.   Difficult to place patient No  Consultants:  Med Onc Rad Onc Palliative Care  Procedures:  XRT  Antimicrobials:  None   Subjective: "I just feel like I need to poop."  Objective: Vitals:   11/19/20 1100 11/19/20 1249 11/19/20 1300 11/19/20 1400  BP:  123/79    Pulse:      Resp: (!) 21 16 18  17  Temp:  (!) 97.5 F (36.4 C)    TempSrc:  Oral    SpO2:  94%    Weight:      Height:        Intake/Output Summary (Last 24 hours) at 11/19/2020 1500 Last data filed at 11/19/2020 0325 Gross per 24 hour  Intake 1098.87 ml  Output 847 ml  Net 251.87 ml   Filed Weights   11/21/2020 1009  Weight: 105.2 kg    Examination:  General: 70 y.o. male resting in bed in NAD Eyes: PERRL, normal sclera ENMT: Nares patent w/o discharge, orophaynx clear, dentition normal, ears w/o discharge/lesions/ulcers Neck: Supple, trachea midline Cardiovascular: RRR, +S1, S2, no m/g/r, equal pulses throughout Respiratory: CTABL, no w/r/r, normal WOB GI: BS+, NDNT, no masses noted, no organomegaly noted MSK: No e/c/c Skin: No rashes, bruises, ulcerations noted Neuro: A&O x 3, no focal deficits   Data Reviewed: I have personally reviewed following labs and imaging studies.  CBC: Recent Labs  Lab 11/13/20 0610 11/20/2020 1111 11/19/20 0412  WBC 20.2* 30.5* 29.1*  NEUTROABS 16.7* 27.1*  --   HGB 10.1* 11.2* 10.3*  HCT 33.2* 36.1* 33.1*  MCV 84.1 82.8 83.6  PLT 265 266  389   Basic Metabolic Panel: Recent Labs  Lab 11/13/20 0610 11/23/2020 1111 11/19/20 0412  NA 131* 128* 129*  K 4.7 5.4* 5.2*  CL 95* 93* 98  CO2 29 26 24   GLUCOSE 128* 170* 178*  BUN 27* 42* 38*  CREATININE 0.50* 0.76 0.70  CALCIUM 7.7* 7.5* 6.9*   GFR: Estimated Creatinine Clearance: 106.1 mL/min (by C-G formula based on SCr of 0.7 mg/dL). Liver Function Tests: Recent Labs  Lab 11/13/20 0610 12/04/2020 1111 11/19/20 0412  AST 30 38 39  ALT 42 55* 53*  ALKPHOS 106 128* 112  BILITOT 0.7 0.6 0.5  PROT 5.3* 5.8* 5.0*  ALBUMIN 2.6* 2.9* 2.5*   No results for input(s): LIPASE, AMYLASE in the last 168 hours. No results for input(s): AMMONIA in the last 168 hours. Coagulation Profile: Recent Labs  Lab 11/30/2020 1111  INR 1.1   Cardiac Enzymes: No results for input(s): CKTOTAL, CKMB, CKMBINDEX, TROPONINI in the last 168 hours. BNP (last 3 results) No results for input(s): PROBNP in the last 8760 hours. HbA1C: No results for input(s): HGBA1C in the last 72 hours. CBG: Recent Labs  Lab 11/19/20 0800 11/19/20 1239  GLUCAP 165* 171*   Lipid Profile: No results for input(s): CHOL, HDL, LDLCALC, TRIG, CHOLHDL, LDLDIRECT in the last 72 hours. Thyroid Function Tests: No results for input(s): TSH, T4TOTAL, FREET4, T3FREE, THYROIDAB in the last 72 hours. Anemia Panel: No results for input(s): VITAMINB12, FOLATE, FERRITIN, TIBC, IRON, RETICCTPCT in the last 72 hours. Sepsis Labs: Recent Labs  Lab 12/06/2020 1113 11/29/2020 1357 11/19/2020 1700 12/10/2020 1945  LATICACIDVEN 3.3* 3.1* 3.1* 3.1*    Recent Results (from the past 240 hour(s))  Culture, blood (routine x 2)     Status: None (Preliminary result)   Collection Time: 12/05/2020 11:11 AM   Specimen: Right Antecubital; Blood  Result Value Ref Range Status   Specimen Description   Final    RIGHT ANTECUBITAL Performed at [facility], [place] [address], [place], [place] [address]    Special Requests    Final    BOTTLES DRAWN AEROBIC AND ANAEROBIC Blood Culture adequate volume Performed at [place] [address], [place], [place] [address]    Culture   Final    NO  GROWTH < 24 HOURS Performed at [facility], [place] [address], [place], [place] [address]    Report Status PENDING  Incomplete  Resp Panel by RT-PCR (Flu A&B, Covid) Nasopharyngeal Swab     Status: None   Collection Time: 11/26/2020 11:11 AM   Specimen: Nasopharyngeal Swab; Nasopharyngeal(NP) swabs in vial transport medium  Result Value Ref Range Status   SARS Coronavirus 2 by RT PCR NEGATIVE NEGATIVE Final    Comment: (NOTE) SARS-CoV-2 target nucleic acids are NOT DETECTED.  The SARS-CoV-2 RNA is generally detectable in upper respiratory specimens during the acute phase of infection. The lowest concentration of SARS-CoV-2 viral copies this assay can detect is 138 copies/mL. A negative result does not preclude SARS-Cov-2 infection and should not be used as the sole basis for treatment or other patient management decisions. A negative result may occur with  improper specimen collection/handling, submission of specimen other than nasopharyngeal swab, presence of viral mutation(s) within the areas targeted by this assay, and inadequate number of viral copies(<138 copies/mL). A negative result must be combined with clinical observations, patient history, and epidemiological information. The expected result is Negative.  Fact Sheet for Patients:  EntrepreneurPulse.com.au  Fact Sheet for Healthcare Providers:  IncredibleEmployment.be  This test is no t yet approved or cleared by the [place] FDA and  has been authorized for detection and/or diagnosis of SARS-CoV-2 by FDA under an Emergency Use Authorization (EUA). This EUA will remain  in effect (meaning this test can be used) for the duration of the COVID-19 declaration under Section 564(b)(1) of the Act,  21 U.S.C.section 360bbb-3(b)(1), unless the authorization is terminated  or revoked sooner.       Influenza A by PCR NEGATIVE NEGATIVE Final   Influenza B by PCR NEGATIVE NEGATIVE Final    Comment: (NOTE) The Xpert Xpress SARS-CoV-2/FLU/RSV plus assay is intended as an aid in the diagnosis of influenza from Nasopharyngeal swab specimens and should not be used as a sole basis for treatment. Nasal washings and aspirates are unacceptable for Xpert Xpress SARS-CoV-2/FLU/RSV testing.  Fact Sheet for Patients: EntrepreneurPulse.com.au  Fact Sheet for Healthcare Providers: IncredibleEmployment.be  This test is not yet approved or cleared by the [place] FDA and has been authorized for detection and/or diagnosis of SARS-CoV-2 by FDA under an Emergency Use Authorization (EUA). This EUA will remain in effect (meaning this test can be used) for the duration of the COVID-19 declaration under Section 564(b)(1) of the Act, 21 U.S.C. section 360bbb-3(b)(1), unless the authorization is terminated or revoked.  Performed at [facility], [place] [address] [place], [place] [address]   Culture, blood (routine x 2)     Status: None (Preliminary result)   Collection Time: 11/15/2020 12:11 PM   Specimen: BLOOD  Result Value Ref Range Status   Specimen Description   Final    BLOOD PORTA CATH Performed at [place] [address] [place], [place] [address]    Special Requests   Final    BOTTLES DRAWN AEROBIC AND ANAEROBIC Blood Culture adequate volume Performed at [place] [address], [place], [place] [address]    Culture   Final    NO GROWTH < 24 HOURS Performed at [place] [address] [place], [place] [address]    Report Status PENDING  Incomplete      Radiology Studies: US RENAL  Result Date: 11/19/2020 CLINICAL DATA:  Urinary retention. EXAM: RENAL / URINARY TRACT  ULTRASOUND COMPLETE COMPARISON:  CT chest, abdomen, and pelvis dated Nov 05, 2020. FINDINGS: Right Kidney: Renal measurements: 11.9 x 5.1 x 5.7 cm = volume: 183 mL. Echogenicity within normal limits. No mass or hydronephrosis visualized. 4.9 cm cyst in the upper pole. Left Kidney: Renal measurements: 11.4 x 6.5 x 6.7 cm = volume: 256 mL. Echogenicity within normal limits. No mass or hydronephrosis visualized. Bladder: Decompressed by Foley catheter. Other: Heterogeneous liver with multiple hypoechoic masses. IMPRESSION: 1. No acute abnormality. 2. Hepatic metastatic disease again noted. Electronically Signed   By: Titus Dubin M.D.   On: 11/19/2020 14:13   DG Chest Port 1 View  Result Date: 12/05/2020 CLINICAL DATA:  Weakness.  Post radiation treatment yesterday. EXAM: PORTABLE CHEST 1 VIEW COMPARISON:  Chest CT Nov 05, 2020 FINDINGS: Injectable port terminates at the cavoatrial junction. The cardiac silhouette is normal. Enlarging left pleural based medial soft tissue mass versus mediastinal mass. No evidence of focal airspace consolidation. Osseous structures are without acute abnormality. Soft tissues are grossly normal. IMPRESSION: Enlarging left pleural based medial soft tissue mass versus mediastinal mass. Electronically Signed   By: Fidela Salisbury M.D.   On: 12/10/2020 12:22   DG Hip Unilat W or Wo Pelvis 2-3 Views Right  Result Date: 12/09/2020 CLINICAL DATA:  Right hip pain. Metastatic melanoma. Currently receiving radiation to this area. EXAM: DG HIP (WITH OR WITHOUT PELVIS) 2-3V RIGHT COMPARISON:  10/13/2020 plain film.  CT 11/05/2020. FINDINGS: AP view the pelvis and AP/frog leg views of the right hip. Bowel gas overlies the right iliac crest and medial superior iliac bone, at the site of a permeative lesions on the prior CT. Femoral heads are located. Sacroiliac joints are symmetric. No acute fracture. IMPRESSION: No acute osseous abnormality. Lytic lesions involving the right ilium are  not well evaluated secondary to overlying bowel gas. Electronically Signed   By: Abigail Miyamoto M.D.   On: 11/20/2020 13:52     Scheduled Meds:  Chlorhexidine Gluconate Cloth  6 each Topical Daily   cholecalciferol  2,000 Units Oral Daily   dexamethasone  12 mg Oral QID   doxazosin  8 mg Oral Daily   feeding supplement  237 mL Oral BID BM   ferrous sulfate  325 mg Oral Q breakfast   fluconazole  100 mg Oral Daily   latanoprost  1 drop Left Eye QHS   metoprolol tartrate  25 mg Oral BID   multivitamin with minerals  1 tablet Oral Daily   nystatin  5 mL Oral QID   oxyCODONE  20 mg Oral Q12H   polyethylene glycol  17 g Oral Daily   temazepam  15 mg Oral QHS   timolol  1 drop Both Eyes QHS   cyanocobalamin  1,000 mcg Oral Daily   Continuous Infusions:  sodium chloride 125 mL/hr at 11/19/20 1018     LOS: 0 days    Time spent: 35 minutes   Jonnie Finner, DO [facility]  If 7PM-7AM, please contact night-coverage [email or web address] 11/19/2020, 3:00 PM

## 2020-11-19 NOTE — Consult Note (Signed)
Referral MD  Reason for Referral: Metastatic melanoma  Chief Complaint  Patient presents with   Weakness  : Patient really is not sure why he was in the hospital.  HPI: Gary Young is well-known to me.  He is a 70 year old white male.  He has metastatic melanoma.  He has incredibly extensive disease.  He has brain, lung, liver, bone, lymph node, gastric, etc. metastasis.  He has biopsy-proven disease.  He was just discharged last week.  He has started radiation therapy because of brain and bone metastasis.  Apparently, at home yesterday, he was having a lot of pain in the right hip area.  He cannot move.  His wife cannot help him.  He subsequently was brought in by EMS.  I think he has had 4 or 5 radiation treatments.  He has not yet started systemic therapy as of yet.  It is hard to figure out how much he really is eating.  It is hard to figure out what kind of pain he is having.  He does have some confusion.  I suppose this could be from the CNS metastasis.  His labs today show sodium 129.  Potassium 5.2.  Blood sugar is 178.  BUN is 38 creatinine 0.7.  The albumin is 2.5.  Calcium is 6.9.  The white cell count is 29.  Hemoglobin 10.3.  Platelet count 225,000.  He did have x-ray of the right hip when he [place].  There are lytic lesions involving the right ilium.  Again is hard to really say him when she is able to do at home.  Is hard to say how much physical therapy he is going to need.  We are awaiting the BRAF analysis to determine how we can try to treat him.  There is been no bleeding.  He has had no cough.  There is no shortness of breath.  Currently, the performance status is ECOG 3.  Past Medical History:  Diagnosis Date   Asthma    Cancer [facility])   :   Past Surgical History:  Procedure Laterality Date   BACK SURGERY     BIOPSY  11/11/2020   Procedure: BIOPSY;  Surgeon: Otis Brace, MD;  Location: [facility]OSCOPY;  Service: Gastroenterology;;    ESOPHAGOGASTRODUODENOSCOPY N/A 11/11/2020   Procedure: ESOPHAGOGASTRODUODENOSCOPY (EGD);  Surgeon: Otis Brace, MD;  Location: [facility];  Service: Gastroenterology;  Laterality: N/A;   EYE SURGERY     IR IMAGING GUIDED PORT INSERTION  11/05/2020   IR US GUIDE BX ASP/DRAIN  11/05/2020   SKIN BIOPSY     TONSILLECTOMY    :   Current Facility-Administered Medications:    0.9 %  sodium chloride infusion, , Intravenous, Continuous, Kyle, Tyrone A, DO, Last Rate: 125 mL/hr at 11/19/20 0300, New Bag at 11/19/20 0300   albuterol (PROVENTIL) (2.5 MG/3ML) 0.083% nebulizer solution 2.5 mg, 2.5 mg, Inhalation, Q6H PRN, Marylyn Ishihara, Tyrone A, DO   calcium carbonate (TUMS - dosed in mg elemental calcium) chewable tablet 200 mg of elemental calcium, 1 tablet, Oral, TID PRN, Marylyn Ishihara, Tyrone A, DO, 200 mg of elemental calcium at 11/27/2020 1737   Chlorhexidine Gluconate Cloth 2 % PADS 6 each, 6 each, Topical, Daily, Kyle, Tyrone A, DO   cholecalciferol (VITAMIN D3) tablet 2,000 Units, 2,000 Units, Oral, Daily, Kyle, Tyrone A, DO   dexamethasone (DECADRON) tablet 12 mg, 12 mg, Oral, QID, Kyle, Tyrone A, DO, 12 mg at 12/02/2020 1931   doxazosin (CARDURA) tablet 8 mg, 8 mg, Oral,  Daily, Marylyn Ishihara, Tyrone A, DO, 8 mg at 12/02/2020 1930   feeding supplement (ENSURE ENLIVE / ENSURE PLUS) liquid 237 mL, 237 mL, Oral, BID BM, Kyle, Tyrone A, DO   ferrous sulfate tablet 325 mg, 325 mg, Oral, Q breakfast, Kyle, Tyrone A, DO   fluconazole (DIFLUCAN) tablet 100 mg, 100 mg, Oral, Daily, Kyle, Tyrone A, DO   HYDROmorphone (DILAUDID) injection 1 mg, 1 mg, Intravenous, Q4H PRN, Marylyn Ishihara, Tyrone A, DO, 1 mg at 11/16/2020 1737   hydrOXYzine (ATARAX/VISTARIL) tablet 25 mg, 25 mg, Oral, TID PRN, Marylyn Ishihara, Tyrone A, DO   latanoprost (XALATAN) 0.005 % ophthalmic solution 1 drop, 1 drop, Left Eye, QHS, Kyle, Tyrone A, DO, 1 drop at 11/26/2020 2231   LORazepam (ATIVAN) tablet 0.5 mg, 0.5 mg, Oral, Q6H PRN, Marylyn Ishihara, Tyrone A, DO   metoprolol tartrate (LOPRESSOR)  tablet 25 mg, 25 mg, Oral, BID, Kyle, Tyrone A, DO, 25 mg at 12/05/2020 2232   multivitamin with minerals tablet 1 tablet, 1 tablet, Oral, Daily, Kyle, Tyrone A, DO   nystatin (MYCOSTATIN) 100000 UNIT/ML suspension 500,000 Units, 5 mL, Oral, QID, Kyle, Tyrone A, DO, 500,000 Units at 12/07/2020 2232   oxyCODONE (Oxy IR/ROXICODONE) immediate release tablet 10 mg, 10 mg, Oral, Q3H PRN, Marylyn Ishihara, Tyrone A, DO, 10 mg at 11/19/20 0354   oxyCODONE (OXYCONTIN) 12 hr tablet 20 mg, 20 mg, Oral, Q12H, Kyle, Tyrone A, DO, 20 mg at 11/29/2020 2232   polyethylene glycol (MIRALAX / GLYCOLAX) packet 17 g, 17 g, Oral, Daily, Kyle, Tyrone A, DO, 17 g at 12/04/2020 1931   simethicone (MYLICON) chewable tablet 80 mg, 80 mg, Oral, Q6H PRN, Marylyn Ishihara, Tyrone A, DO   sodium chloride flush (NS) 0.9 % injection 10-40 mL, 10-40 mL, Intracatheter, PRN, Marylyn Ishihara, Tyrone A, DO   temazepam (RESTORIL) capsule 15 mg, 15 mg, Oral, QHS, Kyle, Tyrone A, DO, 15 mg at 11/11/2020 2232   timolol (TIMOPTIC) 0.5 % ophthalmic solution 1 drop, 1 drop, Both Eyes, QHS, Kyle, Tyrone A, DO   vitamin B-12 (CYANOCOBALAMIN) tablet 1,000 mcg, 1,000 mcg, Oral, Daily, Kyle, Tyrone A, DO:   Chlorhexidine Gluconate Cloth  6 each Topical Daily   cholecalciferol  2,000 Units Oral Daily   dexamethasone  12 mg Oral QID   doxazosin  8 mg Oral Daily   feeding supplement  237 mL Oral BID BM   ferrous sulfate  325 mg Oral Q breakfast   fluconazole  100 mg Oral Daily   latanoprost  1 drop Left Eye QHS   metoprolol tartrate  25 mg Oral BID   multivitamin with minerals  1 tablet Oral Daily   nystatin  5 mL Oral QID   oxyCODONE  20 mg Oral Q12H   polyethylene glycol  17 g Oral Daily   temazepam  15 mg Oral QHS   timolol  1 drop Both Eyes QHS   cyanocobalamin  1,000 mcg Oral Daily  :  No Known Allergies:  No family history on file.:   Social History   Socioeconomic History   Marital status: Married    Spouse name: Not on file   Number of children: Not on file    Years of education: Not on file   Highest education level: Not on file  Occupational History   Not on file  Tobacco Use   Smoking status: Never   Smokeless tobacco: Never  Vaping Use   Vaping Use: Never used  Substance and Sexual Activity   Alcohol use: Yes  Alcohol/week: 2.0 standard drinks    Types: 1 Glasses of wine, 1 Cans of beer per week    Comment: Socially   Drug use: Never   Sexual activity: Not on file  Other Topics Concern   Not on file  Social History Narrative   Not on file   Social Determinants of Health   Financial Resource Strain: Not on file  Food Insecurity: Not on file  Transportation Needs: Not on file  Physical Activity: Not on file  Stress: Not on file  Social Connections: Not on file  Intimate Partner Violence: Not on file  :  Review of Systems  Constitutional:  Positive for malaise/fatigue.  HENT: Negative.    Eyes: Negative.   Respiratory: Negative.    Cardiovascular: Negative.   Gastrointestinal: Negative.   Genitourinary: Negative.   Musculoskeletal:  Positive for joint pain.  Skin: Negative.   Neurological: Negative.   Endo/Heme/Allergies: Negative.   Psychiatric/Behavioral: Negative.      Exam:  Physical Activity: Not on file    Physical Activity: Not on file   Physical Exam Vitals reviewed.  HENT:     Head: Normocephalic and atraumatic.  Eyes:     Pupils: Pupils are equal, round, and reactive to light.  Cardiovascular:     Rate and Rhythm: Normal rate and regular rhythm.     Heart sounds: Normal heart sounds.  Pulmonary:     Effort: Pulmonary effort is normal.     Breath sounds: Normal breath sounds.  Abdominal:     General: Bowel sounds are normal.     Palpations: Abdomen is soft.  Musculoskeletal:        General: No tenderness or deformity. Normal range of motion.     Cervical back: Normal range of motion.  Lymphadenopathy:     Cervical: No cervical adenopathy.  Skin:    General: Skin is warm and dry.      Findings: No erythema or rash.  Neurological:     Mental Status: He is alert and oriented to person, place, and time.  Psychiatric:        Behavior: Behavior normal.        Thought Content: Thought content normal.        Judgment: Judgment normal.      Patient Vitals for the past 24 hrs:  BP Temp Temp src Pulse Resp SpO2 Height Weight  11/19/20 0418 122/89 (!) 97.5 F (36.4 C) Oral (!) 103 16 94 % -- --  12/08/2020 2334 122/79 98 F (36.7 C) Oral 86 16 93 % -- --  11/11/2020 1842 129/80 97.8 F (36.6 C) Oral (!) 108 20 93 % -- --  11/25/2020 1730 (!) 147/89 -- -- (!) 105 20 95 % -- --  11/21/2020 1600 (!) 152/98 -- -- (!) 101 14 93 % -- --  11/20/2020 1530 (!) 141/122 -- -- 97 19 94 % -- --  12/09/2020 1500 (!) 122/93 -- -- (!) 102 (!) 21 93 % -- --  11/13/2020 1430 137/90 -- -- 97 17 93 % -- --  12/04/2020 1330 123/86 -- -- 98 16 94 % -- --  11/29/2020 1230 127/88 -- -- (!) 102 (!) 25 92 % -- --  11/24/2020 1200 121/74 -- -- 96 13 93 % -- --  11/15/2020 1130 132/85 -- -- (!) 104 20 92 % -- --  11/12/2020 1030 125/83 -- -- (!) 104 16 93 % -- --  11/29/2020 1009 136/66 98 F (36.7 C) Oral (!) 110  18 93 % 6' (1.829 m) 105.2 kg      Recent Labs    11/16/2020 1111 11/19/20 0412  WBC 30.5* 29.1*  HGB 11.2* 10.3*  HCT 36.1* 33.1*  PLT 266 225    Recent Labs    12/05/2020 1111 11/19/20 0412  NA 128* 129*  K 5.4* 5.2*  CL 93* 98  CO2 26 24  GLUCOSE 170* 178*  BUN 42* 38*  CREATININE 0.76 0.70  CALCIUM 7.5* 6.9*    Blood smear review: None  Pathology: None    Assessment and Plan: Mr. Sar is a 70 year old white male with metastatic melanoma.  This is incredibly extensive.  He is getting radiation therapy to the brain and bone.  Again is hard to say what is going on with him.  Again he has some confusion.  We need to make sure that radiation oncology knows that he is in the hospital now so they can continue radiation therapy on him.  We just have to see how everything trends over the  next couple days.  Again he is somewhat sensitive to pain medication.  He has already gotten Zometa in the hospital.  I do not know if he can do physical therapy.  I think he was doing some physical therapy when he was in the hospital a week or so ago.  For right now, I does think we have to see how the next day or so go for him.  We will see if the IV fluids can help.  Again, please let Radiation Oncology know that he is in the hospital now so that they can continue him on radiation therapy.  I know that he will get fantastic care from all the staff up on [address].   Lattie Haw, MD  Proverbs 29:25

## 2020-11-19 NOTE — Care Management Obs Status (Signed)
[facility] NOTIFICATION   Patient Details  Name: Gary Young MRN: [id number] Date of Birth: 08/28/1950   Medicare Observation Status Notification Given:  Yes    Purcell Mouton, RN 11/19/2020, 10:37 AM

## 2020-11-19 NOTE — Progress Notes (Signed)
Initial Nutrition Assessment  DOCUMENTATION CODES:   Obesity unspecified  INTERVENTION:  - continue Ensure Enlive BID, each supplement provides 350 kcal and 20 grams of protein. - will order 30 ml Prosource Plus once/day, each supplement provides 100 kcal and 15 grams protein.  - complete NFPE at follow-up.    NUTRITION DIAGNOSIS:   Increased nutrient needs related to cancer and cancer related treatments, chronic illness as evidenced by estimated needs.  GOAL:   Patient will meet greater than or equal to 90% of their needs  MONITOR:   PO intake, Supplement acceptance, Labs, Weight trends  REASON FOR ASSESSMENT:   Malnutrition Screening Tool  ASSESSMENT:   70 y.o. male with medical history of metastatic melanoma (including brain and GI lesions) undergoing XRT, chronic pain, asthma, and iron deficiency anemia. He presented to the ED d/t R hip pain.  Patient out of the room to XRT this AM and then to ultrasound this afternoon.   No intakes documented since admission.  Patient was assessed by another [facility] RD on 5/27 and 6/2. He had undergone a EGD on 6/1 which showed gastritis. He was drinking Ensure during previous admission and eating 50-80% on FLD and ~100% while on Soft diet during that hospitalization.   Ensure Enlive ordered BID and patient had accepted the bottle of supplement provided to him this AM.  Weight yesterday was 232 lb and weight on 5/3 was 243 lb. This indicates 11 lb weight loss (4.5% body weight) in 1 month; not significant for time frame.   Per notes: - dehydration on admission, hyponatremia - intractable pain with dx of metastatic melanoma - continuing XRT during hospitalization - hyperkalemia--doses of lokelma provided - leukocytosis - from home with plan to return home at the time of discharge    Labs reviewed; CBGs: 165 and 171 mg/dl, Na: 129 mmol/l, K: 5.2 mmol/l, BUN: 38 mg/dl, Ca: 6.9 mg/dl, ALT elevated.  Medications reviewed; 2000  units cholecalciferol/day, 325 mg ferrous sulfate/day, 1 tablet multivitamin with minerals/day, 5 ml mycostatin QID,10 g lokelma x1 dose 6/9, 5 g lokelma x1 dose 6/8, 1000 mcg oral cyanocobalamin/day. IVF; NS @ 125 ml/hr.    NUTRITION - FOCUSED PHYSICAL EXAM:  Unable to complete at this time.  Diet Order:   Diet Order             Diet Heart Room service appropriate? Yes; Fluid consistency: Thin  Diet effective now                   EDUCATION NEEDS:   Not appropriate for education at this time  Skin:  Skin Assessment: Reviewed RN Assessment  Last BM:  PTA/unknown  Height:   Ht Readings from Last 1 Encounters:  11/16/2020 6' (1.829 m)    Weight:   Wt Readings from Last 1 Encounters:  12/03/2020 105.2 kg      Estimated Nutritional Needs:  Kcal:  2200-2400 kcal Protein:  115-130 grams Fluid:  >/= 2.3 L/day      Jarome Matin, MS, RD, LDN, CNSC Inpatient Clinical Dietitian RD pager # available in [facility]  After hours/weekend pager # available in [facility]

## 2020-11-20 ENCOUNTER — Encounter: Payer: Self-pay | Admitting: Radiation Oncology

## 2020-11-20 ENCOUNTER — Encounter: Payer: Self-pay | Admitting: *Deleted

## 2020-11-20 ENCOUNTER — Ambulatory Visit
Admit: 2020-11-20 | Discharge: 2020-11-20 | Disposition: A | Discharge status: Home / Self Care | Payer: Medicare HMO | Attending: Radiation Oncology | Admitting: Radiation Oncology

## 2020-11-20 DIAGNOSIS — C787 Secondary malignant neoplasm of liver and intrahepatic bile duct: Secondary | ICD-10-CM | POA: Diagnosis not present

## 2020-11-20 DIAGNOSIS — R531 Weakness: Secondary | ICD-10-CM

## 2020-11-20 DIAGNOSIS — G893 Neoplasm related pain (acute) (chronic): Secondary | ICD-10-CM

## 2020-11-20 DIAGNOSIS — Z515 Encounter for palliative care: Secondary | ICD-10-CM

## 2020-11-20 DIAGNOSIS — C7951 Secondary malignant neoplasm of bone: Secondary | ICD-10-CM | POA: Diagnosis not present

## 2020-11-20 DIAGNOSIS — Z7189 Other specified counseling: Secondary | ICD-10-CM

## 2020-11-20 DIAGNOSIS — R339 Retention of urine, unspecified: Secondary | ICD-10-CM

## 2020-11-20 DIAGNOSIS — C7931 Secondary malignant neoplasm of brain: Secondary | ICD-10-CM | POA: Diagnosis not present

## 2020-11-20 DIAGNOSIS — C439 Malignant melanoma of skin, unspecified: Secondary | ICD-10-CM | POA: Diagnosis not present

## 2020-11-20 LAB — CBC WITH DIFFERENTIAL/PLATELET
Abs Immature Granulocytes: 0.43 10*3/uL — ABNORMAL HIGH (ref 0.00–0.07)
Basophils Absolute: 0 10*3/uL (ref 0.0–0.1)
Basophils Relative: 0 %
Eosinophils Absolute: 0 10*3/uL (ref 0.0–0.5)
Eosinophils Relative: 0 %
HCT: 34 % — ABNORMAL LOW (ref 39.0–52.0)
Hemoglobin: 10.2 g/dL — ABNORMAL LOW (ref 13.0–17.0)
Immature Granulocytes: 2 %
Lymphocytes Relative: 1 %
Lymphs Abs: 0.2 10*3/uL — ABNORMAL LOW (ref 0.7–4.0)
MCH: 25.3 pg — ABNORMAL LOW (ref 26.0–34.0)
MCHC: 30 g/dL (ref 30.0–36.0)
MCV: 84.4 fL (ref 80.0–100.0)
Monocytes Absolute: 1.3 10*3/uL — ABNORMAL HIGH (ref 0.1–1.0)
Monocytes Relative: 5 %
Neutro Abs: 24.6 10*3/uL — ABNORMAL HIGH (ref 1.7–7.7)
Neutrophils Relative %: 92 %
Platelets: 205 10*3/uL (ref 150–400)
RBC: 4.03 MIL/uL — ABNORMAL LOW (ref 4.22–5.81)
RDW: 18.7 % — ABNORMAL HIGH (ref 11.5–15.5)
WBC: 26.5 10*3/uL — ABNORMAL HIGH (ref 4.0–10.5)
nRBC: 0 % (ref 0.0–0.2)

## 2020-11-20 LAB — COMPREHENSIVE METABOLIC PANEL
ALT: 61 U/L — ABNORMAL HIGH (ref 0–44)
AST: 37 U/L (ref 15–41)
Albumin: 2.3 g/dL — ABNORMAL LOW (ref 3.5–5.0)
Alkaline Phosphatase: 121 U/L (ref 38–126)
Anion gap: 10 (ref 5–15)
BUN: 42 mg/dL — ABNORMAL HIGH (ref 8–23)
CO2: 22 mmol/L (ref 22–32)
Calcium: 7.5 mg/dL — ABNORMAL LOW (ref 8.9–10.3)
Chloride: 102 mmol/L (ref 98–111)
Creatinine, Ser: 0.62 mg/dL (ref 0.61–1.24)
GFR, Estimated: >60 mL/min (ref >60)
Glucose, Bld: 229 mg/dL — ABNORMAL HIGH (ref 70–99)
Potassium: 4.9 mmol/L (ref 3.5–5.1)
Sodium: 134 mmol/L — ABNORMAL LOW (ref 135–145)
Total Bilirubin: 0.9 mg/dL (ref 0.3–1.2)
Total Protein: 4.6 g/dL — ABNORMAL LOW (ref 6.5–8.1)

## 2020-11-20 MED ORDER — OXYCODONE HCL ER 20 MG PO T12A
20.0000 mg | EXTENDED_RELEASE_TABLET | Freq: Three times a day (TID) | ORAL | Status: DC
  Administered 2020-11-20 – 2020-11-21 (×4): 20 mg via ORAL
  Filled 2020-11-20 (×4): qty 1

## 2020-11-20 MED ORDER — SODIUM CHLORIDE 0.9 % IV SOLN
510.0000 mg | Freq: Once | INTRAVENOUS | Status: AC
  Administered 2020-11-20: 510 mg via INTRAVENOUS
  Filled 2020-11-20: qty 510

## 2020-11-20 MED ORDER — DEXAMETHASONE 4 MG PO TABS
12.0000 mg | ORAL_TABLET | Freq: Three times a day (TID) | ORAL | Status: DC
  Administered 2020-11-20 – 2020-11-21 (×6): 12 mg via ORAL
  Filled 2020-11-20 (×6): qty 3

## 2020-11-20 MED ORDER — PANTOPRAZOLE SODIUM 40 MG PO TBEC
40.0000 mg | DELAYED_RELEASE_TABLET | Freq: Two times a day (BID) | ORAL | Status: DC
  Administered 2020-11-20: 40 mg via ORAL
  Filled 2020-11-20 (×2): qty 1

## 2020-11-20 NOTE — Consult Note (Signed)
Consultation Note Date: 11/20/2020   Patient Name: Gary Young  DOB: Aug 24, 1950  MRN: [id number]  Age / Sex: 70 y.o., male  PCP: Gary Sacramento, MD Referring Physician: Bonnell Public, MD  Reason for Consultation: Establishing goals of care and Pain control  HPI/Patient Profile: 70 y.o. male  with past medical history of metastatic melanoma, chronic pain, iron deficiency anemia. Presenting with right hip pain. admitted on 11/16/2020 with dehydration w/ electrolyte disturbances, associated muscle cramping, acute on chronic pain exacerbation/intractable pain, leukocytosis.   Clinical Assessment and Goals of Care: Gary Field, NP and Dr. Rowe Young reviewed medical records, received report from team, assessed the patient and then meet at the patient's bedside to discuss diagnosis, GOC, symptom management.   Concept of Palliative Care was introduced as specialized medical care for people and their families living with serious illness.  If focuses on providing relief from the symptoms and stress of a serious illness.  The goal is to improve quality of life for both the patient and the family. Values and goals of care important to patient and family were attempted to be elicited.  Created space and opportunity for patient  and family to explore thoughts and feelings regarding current medical situation  We met with the patient and his wife at the bedside. Pain is improved, currently 4/10. However, not ideally controlled. Does have acute pain after radiation. Still with some anxiety, worse prior to radiation but Ativan is helpful. Pain feels like a cramping. He was able to have a bowel movement this morning, which he is happy about. Apparently there was some delay in his scheduled pain medication yesterday which cause significant worsening of his pain. He is having more frequent indigestion/heartburn. No other significant  concerns verbalized.  He shared that he was athletic in his younger years. Prior to recent hospitalization he was athletic and independent. More recently he was able to ambulate with a walker and had good function. He lives with his wife. After his most recent outpatient radiation he has acute onset of worsening pain which required admission for management.   A  discussion was had today regarding advanced directives.  He continues to want all offered treatments. Plan is for continued radiation while inpatient, continue outpatient radiation and start immunotherapy as outpatient after discharge.   Natural trajectory and expectations at EOL were discussed.  Questions and concerns addressed.  Patient  encouraged to call with questions or concerns.     PMT will continue to support holistically.  Primary Caregiver: PATIENT    SUMMARY OF RECOMMENDATIONS   Increase scheduled OxyContin to every 8 hours Continue current OxyContin IR/hydromorphone prn doses/schedules Continue Ativan prn and Restoril qhs Continue current bowel regimen Add Protonix for GERD symptoms  Code Status/Advance Care Planning: DNR   Symptom Management:  Pain management per above GERD/indigestion management Bowel management per above  Palliative Prophylaxis:  Bowel Regimen and Frequent Pain Assessment  Additional Recommendations (Limitations, Scope, Preferences): Full Scope Treatment  Psycho-social/Spiritual:  Desire for further Chaplaincy support: Not assessed at  this time Additional Recommendations: Caregiving  Support/Resources  Prognosis:  Unable to determine  Discharge Planning: To Be Determined      Primary Diagnoses: Present on Admission:  Hyponatremia  Dehydration   I have reviewed the medical record, interviewed the patient and family, and examined the patient. The following aspects are pertinent.  Past Medical History:  Diagnosis Date   Asthma    Cancer [facility])    Social History    Socioeconomic History   Marital status: Married    Spouse name: Not on file   Number of children: Not on file   Years of education: Not on file   Highest education level: Not on file  Occupational History   Not on file  Tobacco Use   Smoking status: Never   Smokeless tobacco: Never  Vaping Use   Vaping Use: Never used  Substance and Sexual Activity   Alcohol use: Yes    Alcohol/week: 2.0 standard drinks    Types: 1 Glasses of wine, 1 Cans of beer per week    Comment: Socially   Drug use: Never   Sexual activity: Not on file  Other Topics Concern   Not on file  Social History Narrative   Not on file   Social Determinants of Health   Financial Resource Strain: Not on file  Food Insecurity: Not on file  Transportation Needs: Not on file  Physical Activity: Not on file  Stress: Not on file  Social Connections: Not on file   No family history on file. Scheduled Meds:  (feeding supplement) PROSource Plus  30 mL Oral Daily   Chlorhexidine Gluconate Cloth  6 each Topical Daily   cholecalciferol  2,000 Units Oral Daily   dexamethasone  12 mg Oral Q8H   doxazosin  8 mg Oral Daily   feeding supplement  237 mL Oral BID BM   finasteride  5 mg Oral Daily   fluconazole  100 mg Oral Daily   latanoprost  1 drop Left Eye QHS   metoprolol tartrate  25 mg Oral BID   multivitamin with minerals  1 tablet Oral Daily   oxyCODONE  20 mg Oral Q12H   polyethylene glycol  17 g Oral Daily   temazepam  15 mg Oral QHS   timolol  1 drop Both Eyes QHS   cyanocobalamin  1,000 mcg Oral Daily   Continuous Infusions:  sodium chloride 125 mL/hr at 11/20/20 1012   PRN Meds:.albuterol, calcium carbonate, HYDROmorphone (DILAUDID) injection, hydrOXYzine, LORazepam, oxyCODONE, simethicone, sodium chloride flush Medications Prior to Admission:  Prior to Admission medications   Medication Sig Start Date End Date Taking? Authorizing Provider  acetaminophen (TYLENOL) 325 MG tablet Take 2 tablets  (650 mg total) by mouth every 6 (six) hours as needed for mild pain, fever or headache. 11/13/20  Yes Sheikh, Omair Latif, DO  albuterol (VENTOLIN HFA) 108 (90 Base) MCG/ACT inhaler Inhale 1-2 puffs into the lungs every 6 (six) hours as needed for wheezing or shortness of breath. 10/01/20  Yes [provider]  calcium carbonate (TUMS - DOSED IN MG ELEMENTAL CALCIUM) 500 MG chewable tablet Chew 1 tablet by mouth 3 (three) times daily as needed for indigestion or heartburn.   Yes [provider]  Cholecalciferol (VITAMIN D) 50 MCG (2000 UT) tablet Take 2,000 Units by mouth daily.   Yes [provider]  cyanocobalamin 1000 MCG tablet Take 1,000 mcg by mouth daily.   Yes [provider]  dexamethasone (DECADRON) 6 MG tablet Take  2 tablets (12 mg total) by mouth every 6 (six) hours. Patient taking differently: Take 12 mg by mouth 4 (four) times daily. 11/13/20 12/13/20 Yes Sheikh, Omair Latif, DO  doxazosin (CARDURA) 8 MG tablet Take 8 mg by mouth daily. 02/06/20  Yes [provider]  feeding supplement (ENSURE ENLIVE / ENSURE PLUS) LIQD Take 237 mLs by mouth 2 (two) times daily between meals. 11/13/20  Yes Sheikh, Omair Latif, DO  ferrous sulfate 325 (65 FE) MG tablet Take 325 mg by mouth daily.   Yes [provider]  fluconazole (DIFLUCAN) 100 MG tablet Take 1 tablet (100 mg total) by mouth daily for 14 days. 11/13/20 11/27/20 Yes Sheikh, Omair Latif, DO  hydrOXYzine (ATARAX/VISTARIL) 25 MG tablet Take 1 tablet (25 mg total) by mouth 3 (three) times daily as needed. Patient taking differently: Take 25 mg by mouth 3 (three) times daily as needed for itching. 10/27/20  Yes Ennever, Rudell Cobb, MD  latanoprost (XALATAN) 0.005 % ophthalmic solution Place 1 drop into the left eye at bedtime. 10/01/20  Yes [provider]  LORazepam (ATIVAN) 0.5 MG tablet Take 1 tablet (0.5 mg total) by mouth every 6 (six) hours as needed for anxiety. 11/13/20  Yes Sheikh, Omair Latif,  DO  metoprolol tartrate (LOPRESSOR) 25 MG tablet Take 1 tablet (25 mg total) by mouth 2 (two) times daily. 11/13/20  Yes Sheikh, Omair Latif, DO  Multiple Vitamin (MULTIVITAMIN WITH MINERALS) TABS tablet Take 1 tablet by mouth daily. 11/14/20  Yes Sheikh, Omair Latif, DO  nystatin (MYCOSTATIN) 100000 UNIT/ML suspension Take 5 mLs (500,000 Units total) by mouth 4 (four) times daily. 11/13/20  Yes Sheikh, Omair Latif, DO  oxyCODONE (OXYCONTIN) 20 mg 12 hr tablet Take 1 tablet (20 mg total) by mouth every 12 (twelve) hours. 11/13/20  Yes Sheikh, Omair Latif, DO  oxyCODONE 10 MG TABS Take 1 tablet (10 mg total) by mouth every 3 (three) hours as needed for moderate pain or breakthrough pain. 11/13/20  Yes Sheikh, Omair Latif, DO  polyethylene glycol (MIRALAX / GLYCOLAX) 17 g packet Take 17 g by mouth daily. 11/14/20  Yes Sheikh, Omair Latif, DO  simethicone (MYLICON) 80 MG chewable tablet Chew 1 tablet (80 mg total) by mouth every 6 (six) hours as needed for flatulence. 11/13/20  Yes Sheikh, Omair Latif, DO  temazepam (RESTORIL) 15 MG capsule Take 1 capsule (15 mg total) by mouth at bedtime. 11/13/20  Yes Sheikh, Omair Latif, DO  timolol (TIMOPTIC) 0.5 % ophthalmic solution Place 1 drop into both eyes daily. 01/29/20  Yes [provider]  pantoprazole (PROTONIX) 40 MG tablet Take 1 tablet (40 mg total) by mouth 2 (two) times daily before a meal. Patient not taking: Reported on 12/08/2020 11/13/20   Raiford Noble Latif, DO  senna-docusate (SENOKOT-S) 8.6-50 MG tablet Take 2 tablets by mouth 2 (two) times daily. Patient not taking: Reported on 11/13/2020 11/13/20   Raiford Noble Latif, DO   No Known Allergies Review of Systems  Constitutional:  Positive for activity change, appetite change and fatigue.  Respiratory:  Positive for cough.   Gastrointestinal:  Negative for constipation, nausea and vomiting.       Indigestion  Musculoskeletal:  Positive for arthralgias.  Neurological:  Positive for weakness.   Physical  Exam Constitutional:      General: He is awake. He is not in acute distress.    Appearance: He is ill-appearing. He is not toxic-appearing.  Pulmonary:     Effort: Pulmonary effort is normal.  No respiratory distress.  Neurological:     Mental Status: He is alert.     Motor: Weakness present.  Psychiatric:        Mood and Affect: Mood normal.        Behavior: Behavior normal.    Vital Signs: BP (!) 127/93 (BP Location: Left Arm)   Pulse 85   Temp 98.4 F (36.9 C)   Resp 17   Ht 6' (1.829 m)   Wt 105.2 kg   SpO2 94%   BMI 31.46 kg/m  Pain Scale: 0-10   Pain Score: 8    SpO2: SpO2: 94 % O2 Device:SpO2: 94 % O2 Flow Rate: .O2 Flow Rate (L/min): 2 L/min  IO: Intake/output summary:  Intake/Output Summary (Last 24 hours) at 11/20/2020 1111 Last data filed at 11/20/2020 0920 Gross per 24 hour  Intake 3167.21 ml  Output 950 ml  Net 2217.21 ml    LBM: Last BM Date: 11/19/20 Baseline Weight: Weight: 105.2 kg Most recent weight: Weight: 105.2 kg     Palliative Assessment/Data:     Time In: 10:15 Time Out: 11:30 Time Total: 75 minutes  Greater than 50%  of this time was spent counseling and coordinating care related to the above assessment and plan.  Signed by: Gary Field, NP Loistine Chance, MD   Please contact Palliative Medicine Team phone at [phone] for questions and concerns.  For individual provider: See Shea Evans

## 2020-11-20 NOTE — Progress Notes (Signed)
Gary Young seems to be doing little bit better today.  He seems to be a little more oriented.  It is still hard to say how much pain he has.  It is hard to say when she is eating.  There does not seem to be any problems with nausea or vomiting.  He apparently had a renal ultrasound done.  I am unsure if there was urinary retention.  The renal ultrasound looked okay.  So far, no labs were back yet today.  He did have radiation therapy yesterday.  Hopefully will be able to continue this.  Of note, the BRAF analysis is negative.  As such, immunotherapy will be how we treat him.  It would be nice to try the newly approved combination therapy- Opduolag.  Unfortunately, because this is not in the Epic system as one of our protocols, we cannot use it.  I would like to consider using combination will be the way that we have to treat him.  Since he has such a high burden of disease.  I am not sure how well he would tolerate this.  His vital signs show temperature of 98.4.  Pulse 82.  Blood pressure 127/93.  His oral exam does not show any thrush.  There is no adenopathy in the neck.  Lungs are clear bilaterally.  Cardiac exam regular rate and rhythm.  There is a 1/6 systolic murmur.  Abdomen is soft.  Bowel sounds are present.  There is no guarding or rebound tenderness.  There is no obvious hepatomegaly.  Extremities show some trace edema in his lower legs.  Neurological exam shows no focal deficits.  Gary Young has metastatic melanoma.  The BRAF analysis is negative.  As such, there is no targeted therapy that we can plan.  Immunotherapy will be our treatment of choice now.  He will continue his radiation therapy.  He does look better.  Hopefully he will continue to improve.  I suppose pain control will be essential.  I do appreciate the great care that he is getting from all the staff up on [address].  Gary Haw, MD  Psalm 118:14

## 2020-11-20 NOTE — Progress Notes (Signed)
PROGRESS NOTE    [name]  OVZ:[id number] DOB: [birth year] DOA: 11/17/2020 PCP: [name], MD   Brief Narrative:   Gary Young is a 70 y.o. male with medical history significant of metastatic melanoma, chronic pain, iron deficiency anemia. Presenting with right hip pain. Found to be dehydrated w/ electrolyte disturbances. Some confusion ON and urinary retention. He is already on cardura. Will add proscar and place foley. To continue XRT while here. Ca2+ is now low. Will replace with IV Ca2+ today. Also likely contributing to his weakness.   11/20/2020: Patient seen alongside patient's wife.  Patient has just returned from radiation therapy.  No new complaints.  Calcium level needs to be corrected considering albumin level.   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  Continue to monitor patient closely.   Intractable pain Metastatic melanoma     - he's on radiation; he follows w/ Dr. Marin Olp (notified of admission by secure chat)     - continue home regimen, add PRN meds     - to continue XRT while here   Hyperkalemia     - still mildly elevated today     - continue fluids and give another OT dose of lokelma 6/10/202: Potassium is 4.9 today.  Continue to monitor closely.  Hypocalcemia     -Monitor closely.  Assess calcium level, considering albumin level.   Elevated lactic acid Leukocytosis     - continue fluids; follow lactic acid     - UA is negative, CXR w/o new airspace disease     - he has no fever, vitals are otherwise stable     - UCx and Bld Cx sent; he doesn't have the clinical appearance of infection     - he has significant metastasis and is on high dose steroids; I think right now we can hold on abx  and wait for cultures to show something     - 6/9: still not really giving the impression of infection, WBC improving    Hyponatremia     - continue fluids as above 11/20/2020: Resolving.  Sodium is 134 today.  Suspect secondary to volume depletion.  BPH Urinary retention     - bladder scans showing elevated volumes; he's been I&O'd once already and still retaining     - check renal/bladder US     - continue cardura, add proscar     - place foley  Acute encephalopathy     - etiology unknown     - some confusion ON and is a little slow this morning     - follow sleep/wake cycles; try to keep active during the day 11/20/2020: Seems to be resolving.  DVT prophylaxis: SCDs Code Status: DNR Family Communication: w/ wife   Status is: Inpatient  Remains inpatient appropriate because:Inpatient level of care appropriate due to severity of illness  Dispo: The patient is from: Home              Anticipated d/c is to: Home              Patient currently is not medically stable to d/c.  Difficult to place patient No  Consultants:  Med Onc Rad Onc Palliative Care  Procedures:  XRT  Antimicrobials:  None   Subjective: No new complaints. Patient just returned from radiation therapy.  Objective: Vitals:   11/20/20 0618 11/20/20 1246 11/20/20 1255 11/20/20 1433  BP: (!) 127/93 93/67 (!) 84/56 106/86  Pulse: 85 (!) 108 (!) 109 (!) 104  Resp: 17 16  16   Temp: 98.4 F (36.9 C) 97.6 F (36.4 C)  97.6 F (36.4 C)  TempSrc:  Oral  Oral  SpO2: 94% 96% 98% 95%  Weight:      Height:        Intake/Output Summary (Last 24 hours) at 11/20/2020 1619 Last data filed at 11/20/2020 1500 Gross per 24 hour  Intake 4411 ml  Output 950 ml  Net 3461 ml    Filed Weights   12/09/2020 1009  Weight: 105.2 kg    Examination:  General: Chronically ill.  Not in any distress.  Awake and alert. Eyes: Patient is pale.   Neck: Supple Cardiovascular: S1-S2. Respiratory: Clear to  auscultation.   GI: Obese, soft and nontender.  Organs are difficult to assess.   Neuro: A&O x 3, no focal deficits Extremities: Patient has edema of the extremities.   Data Reviewed: I have personally reviewed following labs and imaging studies.  CBC: Recent Labs  Lab 11/24/2020 1111 11/19/20 0412 11/20/20 0915  WBC 30.5* 29.1* 26.5*  NEUTROABS 27.1*  --  24.6*  HGB 11.2* 10.3* 10.2*  HCT 36.1* 33.1* 34.0*  MCV 82.8 83.6 84.4  PLT 266 225 536    Basic Metabolic Panel: Recent Labs  Lab 12/09/2020 1111 11/19/20 0412 11/20/20 0915  NA 128* 129* 134*  K 5.4* 5.2* 4.9  CL 93* 98 102  CO2 26 24 22   GLUCOSE 170* 178* 229*  BUN 42* 38* 42*  CREATININE 0.76 0.70 0.62  CALCIUM 7.5* 6.9* 7.5*    GFR: Estimated Creatinine Clearance: 106.1 mL/min (by C-G formula based on SCr of 0.62 mg/dL). Liver Function Tests: Recent Labs  Lab 12/01/2020 1111 11/19/20 0412 11/20/20 0915  AST 38 39 37  ALT 55* 53* 61*  ALKPHOS 128* 112 121  BILITOT 0.6 0.5 0.9  PROT 5.8* 5.0* 4.6*  ALBUMIN 2.9* 2.5* 2.3*    No results for input(s): LIPASE, AMYLASE in the last 168 hours. No results for input(s): AMMONIA in the last 168 hours. Coagulation Profile: Recent Labs  Lab 12/08/2020 1111  INR 1.1    Cardiac Enzymes: No results for input(s): CKTOTAL, CKMB, CKMBINDEX, TROPONINI in the last 168 hours. BNP (last 3 results) No results for input(s): PROBNP in the last 8760 hours. HbA1C: No results for input(s): HGBA1C in the last 72 hours. CBG: Recent Labs  Lab 11/19/20 0800 11/19/20 1239 11/19/20 1636  GLUCAP 165* 171* 174*    Lipid Profile: No results for input(s): CHOL, HDL, LDLCALC, TRIG, CHOLHDL, LDLDIRECT in the last 72 hours. Thyroid Function Tests: No results for input(s): TSH, T4TOTAL, FREET4, T3FREE, THYROIDAB in the last 72 hours. Anemia Panel: No results for input(s): VITAMINB12, FOLATE, FERRITIN, TIBC, IRON, RETICCTPCT in the last 72 hours. Sepsis Labs: Recent Labs  Lab  11/23/2020 1113 12/10/2020 1357 12/09/2020 1700 11/19/2020 1945  LATICACIDVEN 3.3* 3.1* 3.1* 3.1*     Recent Results (from the past 240 hour(s))  Culture, blood (routine x 2)     Status: None (Preliminary result)   Collection Time: 12/02/2020 11:11 AM   Specimen: Right Antecubital; Blood  Result Value  Ref Range Status   Specimen Description   Final    RIGHT ANTECUBITAL Performed at [facility] [address], [place], [place] [address]    Special Requests   Final    BOTTLES DRAWN AEROBIC AND ANAEROBIC Blood Culture adequate volume Performed at [facility] [address], [place], [place] [address]    Culture   Final    NO GROWTH 2 DAYS Performed at [facility] [address] [place], [place] [address]    Report Status PENDING  Incomplete  Resp Panel by RT-PCR (Flu A&B, Covid) Nasopharyngeal Swab     Status: None   Collection Time: 11/27/2020 11:11 AM   Specimen: Nasopharyngeal Swab; Nasopharyngeal(NP) swabs in vial transport medium  Result Value Ref Range Status   SARS Coronavirus 2 by RT PCR NEGATIVE NEGATIVE Final    Comment: (NOTE) SARS-CoV-2 target nucleic acids are NOT DETECTED.  The SARS-CoV-2 RNA is generally detectable in upper respiratory specimens during the acute phase of infection. The lowest concentration of SARS-CoV-2 viral copies this assay can detect is 138 copies/mL. A negative result does not preclude SARS-Cov-2 infection and should not be used as the sole basis for treatment or other patient management decisions. A negative result may occur with  improper specimen collection/handling, submission of specimen other than nasopharyngeal swab, presence of viral mutation(s) within the areas targeted by this assay, and inadequate number of viral copies(<138 copies/mL). A negative result must be combined with clinical observations, patient history, and epidemiological information. The expected result is Negative.  Fact  Sheet for Patients:  EntrepreneurPulse.com.au  Fact Sheet for Healthcare Providers:  IncredibleEmployment.be  This test is no t yet approved or cleared by the [place] FDA and  has been authorized for detection and/or diagnosis of SARS-CoV-2 by FDA under an Emergency Use Authorization (EUA). This EUA will remain  in effect (meaning this test can be used) for the duration of the COVID-19 declaration under Section 564(b)(1) of the Act, 21 U.S.C.section 360bbb-3(b)(1), unless the authorization is terminated  or revoked sooner.       Influenza A by PCR NEGATIVE NEGATIVE Final   Influenza B by PCR NEGATIVE NEGATIVE Final    Comment: (NOTE) The Xpert Xpress SARS-CoV-2/FLU/RSV plus assay is intended as an aid in the diagnosis of influenza from Nasopharyngeal swab specimens and should not be used as a sole basis for treatment. Nasal washings and aspirates are unacceptable for Xpert Xpress SARS-CoV-2/FLU/RSV testing.  Fact Sheet for Patients: EntrepreneurPulse.com.au  Fact Sheet for Healthcare Providers: IncredibleEmployment.be  This test is not yet approved or cleared by the [place] FDA and has been authorized for detection and/or diagnosis of SARS-CoV-2 by FDA under an Emergency Use Authorization (EUA). This EUA will remain in effect (meaning this test can be used) for the duration of the COVID-19 declaration under Section 564(b)(1) of the Act, 21 U.S.C. section 360bbb-3(b)(1), unless the authorization is terminated or revoked.  Performed at [facility], [place] [address], [place], [place] [address]   Culture, blood (routine x 2)     Status: None (Preliminary result)   Collection Time: 12/01/2020 12:11 PM   Specimen: BLOOD  Result Value Ref Range Status   Specimen Description   Final    BLOOD PORTA CATH Performed at [facility] [address] [place],  [place] [address]    Special Requests   Final    BOTTLES DRAWN AEROBIC AND ANAEROBIC Blood Culture adequate volume Performed  at [facility], [place] [address], [place], [place] [address]    Culture   Final    NO GROWTH 2 DAYS Performed at [facility] [address] [place], [place] [address]    Report Status PENDING  Incomplete       Radiology Studies: US RENAL  Result Date: 11/19/2020 CLINICAL DATA:  Urinary retention. EXAM: RENAL / URINARY TRACT ULTRASOUND COMPLETE COMPARISON:  CT chest, abdomen, and pelvis dated Nov 05, 2020. FINDINGS: Right Kidney: Renal measurements: 11.9 x 5.1 x 5.7 cm = volume: 183 mL. Echogenicity within normal limits. No mass or hydronephrosis visualized. 4.9 cm cyst in the upper pole. Left Kidney: Renal measurements: 11.4 x 6.5 x 6.7 cm = volume: 256 mL. Echogenicity within normal limits. No mass or hydronephrosis visualized. Bladder: Decompressed by Foley catheter. Other: Heterogeneous liver with multiple hypoechoic masses. IMPRESSION: 1. No acute abnormality. 2. Hepatic metastatic disease again noted. Electronically Signed   By: Titus Dubin M.D.   On: 11/19/2020 14:13     Scheduled Meds:  (feeding supplement) PROSource Plus  30 mL Oral Daily   Chlorhexidine Gluconate Cloth  6 each Topical Daily   cholecalciferol  2,000 Units Oral Daily   dexamethasone  12 mg Oral Q8H   doxazosin  8 mg Oral Daily   feeding supplement  237 mL Oral BID BM   finasteride  5 mg Oral Daily   fluconazole  100 mg Oral Daily   latanoprost  1 drop Left Eye QHS   metoprolol tartrate  25 mg Oral BID   multivitamin with minerals  1 tablet Oral Daily   oxyCODONE  20 mg Oral Q8H   pantoprazole  40 mg Oral BID AC   polyethylene glycol  17 g Oral Daily   temazepam  15 mg Oral QHS   timolol  1 drop Both Eyes QHS   cyanocobalamin  1,000 mcg Oral Daily   Continuous Infusions:  sodium chloride 125 mL/hr at 11/20/20 1012     LOS: 1 day    Time spent: 35  minutes   Bonnell Public, MD [facility]  If 7PM-7AM, please contact night-coverage [email or web address] 11/20/2020, 4:19 PM

## 2020-11-20 NOTE — Plan of Care (Signed)
  Problem: Education: Goal: Knowledge of General Education information will improve Description: Including pain rating scale, medication(s)/side effects and non-pharmacologic comfort measures Outcome: Progressing   Problem: Coping: Goal: Level of anxiety will decrease Outcome: Progressing   Problem: Elimination: Goal: Will not experience complications related to bowel motility Outcome: Progressing Goal: Will not experience complications related to urinary retention Outcome: Progressing   Problem: Elimination: Goal: Will not experience complications related to urinary retention Outcome: Progressing   Problem: Pain Managment: Goal: General experience of comfort will improve Outcome: Progressing   Problem: Safety: Goal: Ability to remain free from injury will improve Outcome: Progressing   Problem: Skin Integrity: Goal: Risk for impaired skin integrity will decrease Outcome: Progressing

## 2020-11-20 NOTE — Progress Notes (Signed)
Patient hospitalized for pain management, and other complications. He is still receiving radiation. His BRAF was negative so we will plan immunotherapy once discharged. Will follow up for needs after discharge and scheduling follow up/treatment.   Oncology Nurse Navigator Documentation  Oncology Nurse Navigator Flowsheets 11/20/2020  Abnormal Finding Date -  Confirmed Diagnosis Date -  Diagnosis Status -  Phase of Treatment -  Radiation Actual Start Date: -  Navigator Follow Up Date: 11/24/2020  Navigator Follow Up Reason: Appointment Review  Navigator Location [facility]  Referral Date to RadOnc/MedOnc -  Navigator Encounter Type Appt/Treatment Plan Review  Telephone -  Treatment Initiated Date -  Patient Visit Type MedOnc  Treatment Phase Active Tx  Barriers/Navigation Needs Coordination of Care;Education  Education -  Interventions None Required  Acuity Level 2-Minimal Needs (1-2 Barriers Identified)  Coordination of Care -  Education Method -  Support Groups/Services Friends and Family  Time Spent with Patient 15

## 2020-11-21 DIAGNOSIS — C799 Secondary malignant neoplasm of unspecified site: Secondary | ICD-10-CM

## 2020-11-21 DIAGNOSIS — R52 Pain, unspecified: Secondary | ICD-10-CM

## 2020-11-21 LAB — COMPREHENSIVE METABOLIC PANEL WITH GFR
ALT: 66 U/L — ABNORMAL HIGH (ref 0–44)
AST: 43 U/L — ABNORMAL HIGH (ref 15–41)
Albumin: 2.3 g/dL — ABNORMAL LOW (ref 3.5–5.0)
Alkaline Phosphatase: 113 U/L (ref 38–126)
Anion gap: 8 (ref 5–15)
BUN: 43 mg/dL — ABNORMAL HIGH (ref 8–23)
CO2: 23 mmol/L (ref 22–32)
Calcium: 7 mg/dL — ABNORMAL LOW (ref 8.9–10.3)
Chloride: 103 mmol/L (ref 98–111)
Creatinine, Ser: 0.72 mg/dL (ref 0.61–1.24)
GFR, Estimated: >60 mL/min
Glucose, Bld: 186 mg/dL — ABNORMAL HIGH (ref 70–99)
Potassium: 4.9 mmol/L (ref 3.5–5.1)
Sodium: 134 mmol/L — ABNORMAL LOW (ref 135–145)
Total Bilirubin: 0.7 mg/dL (ref 0.3–1.2)
Total Protein: 4.5 g/dL — ABNORMAL LOW (ref 6.5–8.1)

## 2020-11-21 LAB — CBC WITH DIFFERENTIAL/PLATELET
Abs Immature Granulocytes: 0.3 K/uL — ABNORMAL HIGH (ref 0.00–0.07)
Basophils Absolute: 0 K/uL (ref 0.0–0.1)
Basophils Relative: 0 %
Eosinophils Absolute: 0 K/uL (ref 0.0–0.5)
Eosinophils Relative: 0 %
HCT: 34.3 % — ABNORMAL LOW (ref 39.0–52.0)
Hemoglobin: 10.4 g/dL — ABNORMAL LOW (ref 13.0–17.0)
Immature Granulocytes: 1 %
Lymphocytes Relative: 1 %
Lymphs Abs: 0.2 K/uL — ABNORMAL LOW (ref 0.7–4.0)
MCH: 25.7 pg — ABNORMAL LOW (ref 26.0–34.0)
MCHC: 30.3 g/dL (ref 30.0–36.0)
MCV: 84.9 fL (ref 80.0–100.0)
Monocytes Absolute: 0.9 K/uL (ref 0.1–1.0)
Monocytes Relative: 4 %
Neutro Abs: 21.7 K/uL — ABNORMAL HIGH (ref 1.7–7.7)
Neutrophils Relative %: 94 %
Platelets: 208 K/uL (ref 150–400)
RBC: 4.04 MIL/uL — ABNORMAL LOW (ref 4.22–5.81)
RDW: 19 % — ABNORMAL HIGH (ref 11.5–15.5)
WBC: 23.1 K/uL — ABNORMAL HIGH (ref 4.0–10.5)
nRBC: 0 % (ref 0.0–0.2)

## 2020-11-21 MED ORDER — ONDANSETRON HCL 4 MG/2ML IJ SOLN
4.0000 mg | Freq: Four times a day (QID) | INTRAMUSCULAR | Status: DC | PRN
  Administered 2020-11-21: 4 mg via INTRAVENOUS
  Filled 2020-11-21: qty 2

## 2020-11-21 MED ORDER — LIDOCAINE 5 % EX PTCH
1.0000 | MEDICATED_PATCH | CUTANEOUS | Status: DC
  Administered 2020-11-21: 1 via TRANSDERMAL
  Filled 2020-11-21 (×2): qty 1

## 2020-11-21 MED ORDER — LIP MEDEX EX OINT
TOPICAL_OINTMENT | CUTANEOUS | Status: AC
  Filled 2020-11-21: qty 7

## 2020-11-21 NOTE — Progress Notes (Signed)
   11/20/20 2055  Assess: MEWS Score  Temp 97.9 F (36.6 C)  BP 127/78  Pulse Rate (!) 118  Resp 20  Level of Consciousness Alert  SpO2 93 %  O2 Device Room Air  Patient Activity (if Appropriate) In bed  Assess: MEWS Score  MEWS Temp 0  MEWS Systolic 0  MEWS Pulse 2  MEWS RR 0  MEWS LOC 0  MEWS Score 2  MEWS Score Color Yellow  Assess: if the MEWS score is Yellow or Red  Were vital signs taken at a resting state? Yes  Focused Assessment No change from prior assessment  Does the patient meet 2 or more of the SIRS criteria? No  MEWS guidelines implemented *See Row Information* No, previously yellow, continue vital signs every 4 hours  Treat  MEWS Interventions Administered scheduled meds/treatments  Pain Scale 0-10  Pain Score 5  Pain Type Acute pain  Pain Location Abdomen  Pain Orientation  (generalized)  Pain Descriptors / Indicators Discomfort  Pain Frequency Intermittent  Pain Onset Progressive  Patients Stated Pain Goal 2  Pain Intervention(s) Medication (See eMAR) (routine scheduled meds)  Multiple Pain Sites No  Notify: Charge Nurse/RN  Name of Charge Nurse/RN Notified Dellie Catholic, RN  Date Charge Nurse/RN Notified 11/20/20  Time Charge Nurse/RN Notified 2058  Assess: SIRS CRITERIA  SIRS Temperature  0  SIRS Pulse 1  SIRS Respirations  0  SIRS WBC 0  SIRS Score Sum  1

## 2020-11-21 NOTE — Progress Notes (Signed)
PROGRESS NOTE    Gary Young  ERX:[id number] DOB: 05-28-1951 DOA: 12/07/2020 PCP: [name], MD   Brief Narrative:   Gary Young is a 70 y.o. male with medical history significant of metastatic melanoma, chronic pain, iron deficiency anemia. Presenting with right hip pain. Found to be dehydrated w/ electrolyte disturbances. Some confusion ON and urinary retention. He is already on cardura. Will add proscar and place foley. To continue XRT while here. Ca2+ is now low. Will replace with IV Ca2+ today. Also likely contributing to his weakness.   11/20/2020: Patient seen alongside patient's wife.  Patient has just returned from radiation therapy.  No new complaints.  Calcium level needs to be corrected considering albumin level.  11/21/2020: Patient seen alongside patient's wife and twin sister.  The twin sister is from [place].  Abdominal pain and tenderness noted, likely secondary to tumor burden.  Otherwise, patient's pain is well controlled.   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  Continue to monitor patient closely.   Intractable pain Metastatic melanoma     - he's on radiation; he follows w/ Dr. Marin Olp (notified of admission by secure chat)     - continue home regimen, add PRN meds     - to continue XRT while here 11/21/2020: Abdominal tenderness.  Abdominal tenderness is likely secondary to tumor burden.   Hyperkalemia     - still mildly elevated today     - continue fluids and give another OT dose of lokelma 6/10/202: Potassium is 4.9 today.  Continue to monitor closely.  Hypocalcemia     -Monitor closely.  Assess calcium level, considering albumin level.   Elevated lactic acid Leukocytosis     - continue  fluids; follow lactic acid     - UA is negative, CXR w/o new airspace disease     - he has no fever, vitals are otherwise stable     - UCx and Bld Cx sent; he doesn't have the clinical appearance of infection     - he has significant metastasis and is on high dose steroids; I think right now we can hold on abx and wait for cultures to show something     - 6/9: still not really giving the impression of infection, WBC improving    Hyponatremia     - continue fluids as above 11/20/2020: Resolving.  Sodium is 134 today.  Suspect secondary to volume depletion.  BPH Urinary retention     - bladder scans showing elevated volumes; he's been I&O'd once already and still retaining     - check renal/bladder US     - continue cardura, add proscar     - place foley  Acute encephalopathy     - etiology unknown     - some confusion ON and is a little slow this morning     - follow sleep/wake cycles; try to keep active during the day 11/20/2020: Seems to be resolving.  DVT prophylaxis: SCDs Code Status: DNR Family Communication: w/ wife   Status is: Inpatient  Remains inpatient appropriate because:Inpatient level of care appropriate due to severity of illness  Dispo: The patient is from: Home              Anticipated d/c is to: Home              Patient currently is not medically stable to d/c.   Difficult to place patient No  Consultants:  Med Onc Rad Onc Palliative Care  Procedures:  XRT  Antimicrobials:  None   Subjective: Abdominal pain.  Objective: Vitals:   11/21/20 0609 11/21/20 0945 11/21/20 1138 11/21/20 1702  BP: 99/82 114/84 (!) 124/92 107/80  Pulse: 96   (!) 107  Resp: 14  20 20   Temp: 97.8 F (36.6 C)     TempSrc: Oral     SpO2: 93%   95%  Weight:      Height:        Intake/Output Summary (Last 24 hours) at 11/21/2020 1723 Last data filed at 11/21/2020 1600 Gross per 24 hour  Intake 1978.72 ml  Output 400 ml  Net 1578.72 ml    Filed Weights   11/14/2020  1009  Weight: 105.2 kg    Examination:  General: Chronically ill.  Not in any distress.  Awake and alert. Eyes: Patient is pale.   Neck: Supple Cardiovascular: S1-S2. Respiratory: Clear to auscultation.   GI: Obese, soft and tender.  Organs are difficult to assess.   Neuro: A&O x 3, no focal deficits Extremities: Patient has edema of the extremities.   Data Reviewed: I have personally reviewed following labs and imaging studies.  CBC: Recent Labs  Lab 11/24/2020 1111 11/19/20 0412 11/20/20 0915 11/21/20 0305  WBC 30.5* 29.1* 26.5* 23.1*  NEUTROABS 27.1*  --  24.6* 21.7*  HGB 11.2* 10.3* 10.2* 10.4*  HCT 36.1* 33.1* 34.0* 34.3*  MCV 82.8 83.6 84.4 84.9  PLT 266 225 205 540    Basic Metabolic Panel: Recent Labs  Lab 11/29/2020 1111 11/19/20 0412 11/20/20 0915 11/21/20 0305  NA 128* 129* 134* 134*  K 5.4* 5.2* 4.9 4.9  CL 93* 98 102 103  CO2 26 24 22 23   GLUCOSE 170* 178* 229* 186*  BUN 42* 38* 42* 43*  CREATININE 0.76 0.70 0.62 0.72  CALCIUM 7.5* 6.9* 7.5* 7.0*    GFR: Estimated Creatinine Clearance: 106.1 mL/min (by C-G formula based on SCr of 0.72 mg/dL). Liver Function Tests: Recent Labs  Lab 12/04/2020 1111 11/19/20 0412 11/20/20 0915 11/21/20 0305  AST 38 39 37 43*  ALT 55* 53* 61* 66*  ALKPHOS 128* 112 121 113  BILITOT 0.6 0.5 0.9 0.7  PROT 5.8* 5.0* 4.6* 4.5*  ALBUMIN 2.9* 2.5* 2.3* 2.3*    No results for input(s): LIPASE, AMYLASE in the last 168 hours. No results for input(s): AMMONIA in the last 168 hours. Coagulation Profile: Recent Labs  Lab 12/10/2020 1111  INR 1.1    Cardiac Enzymes: No results for input(s): CKTOTAL, CKMB, CKMBINDEX, TROPONINI in the last 168 hours. BNP (last 3 results) No results for input(s): PROBNP in the last 8760 hours. HbA1C: No results for input(s): HGBA1C in the last 72 hours. CBG: Recent Labs  Lab 11/19/20 0800 11/19/20 1239 11/19/20 1636  GLUCAP 165* 171* 174*    Lipid Profile: No results for  input(s): CHOL, HDL, LDLCALC, TRIG, CHOLHDL, LDLDIRECT in the last 72 hours. Thyroid Function Tests: No results for input(s): TSH, T4TOTAL, FREET4, T3FREE, THYROIDAB in the last 72 hours. Anemia Panel: No results for input(s): VITAMINB12, FOLATE, FERRITIN, TIBC, IRON, RETICCTPCT in the last 72 hours. Sepsis Labs: Recent Labs  Lab 11/27/2020 1113 11/23/2020 1357 12/10/2020 1700 12/02/2020 1945  LATICACIDVEN 3.3* 3.1* 3.1* 3.1*     Recent Results (from the past 240 hour(s))  Culture, blood (routine x 2)     Status: None (Preliminary result)   Collection Time: 11/12/2020 11:11 AM   Specimen: Right Antecubital; Blood  Result Value Ref Range Status   Specimen Description   Final    RIGHT ANTECUBITAL Performed at [facility] [address] [place], [place] [address]    Special Requests   Final    BOTTLES DRAWN AEROBIC AND ANAEROBIC Blood Culture adequate volume Performed at [facility] [address], [place], [place] [address]    Culture   Final    NO GROWTH 3 DAYS Performed at [facility], [place] [address], [place], [place] [address]    Report Status PENDING  Incomplete  Resp Panel by RT-PCR (Flu A&B, Covid) Nasopharyngeal Swab     Status: None   Collection Time: 12/04/2020 11:11 AM   Specimen: Nasopharyngeal Swab; Nasopharyngeal(NP) swabs in vial transport medium  Result Value Ref Range Status   SARS Coronavirus 2 by RT PCR NEGATIVE NEGATIVE Final    Comment: (NOTE) SARS-CoV-2 target nucleic acids are NOT DETECTED.  The SARS-CoV-2 RNA is generally detectable in upper respiratory specimens during the acute phase of infection. The lowest concentration of SARS-CoV-2 viral copies this assay can detect is 138 copies/mL. A negative result does not preclude SARS-Cov-2 infection and should not be used as the sole basis for treatment or other patient management decisions. A negative result may occur with  improper specimen collection/handling,  submission of specimen other than nasopharyngeal swab, presence of viral mutation(s) within the areas targeted by this assay, and inadequate number of viral copies(<138 copies/mL). A negative result must be combined with clinical observations, patient history, and epidemiological information. The expected result is Negative.  Fact Sheet for Patients:  EntrepreneurPulse.com.au  Fact Sheet for Healthcare Providers:  IncredibleEmployment.be  This test is no t yet approved or cleared by the [place] FDA and  has been authorized for detection and/or diagnosis of SARS-CoV-2 by FDA under an Emergency Use Authorization (EUA). This EUA will remain  in effect (meaning this test can be used) for the duration of the COVID-19 declaration under Section 564(b)(1) of the Act, 21 U.S.C.section 360bbb-3(b)(1), unless the authorization is terminated  or revoked sooner.       Influenza A by PCR NEGATIVE NEGATIVE Final   Influenza B by PCR NEGATIVE NEGATIVE Final    Comment: (NOTE) The Xpert Xpress SARS-CoV-2/FLU/RSV plus assay is intended as an aid in the diagnosis of influenza from Nasopharyngeal swab specimens and should not be used as a sole basis for treatment. Nasal washings and aspirates are unacceptable for Xpert Xpress SARS-CoV-2/FLU/RSV testing.  Fact Sheet for Patients: EntrepreneurPulse.com.au  Fact Sheet for Healthcare Providers: IncredibleEmployment.be  This test is not yet approved or cleared by the [place] FDA and has been authorized for detection and/or diagnosis of SARS-CoV-2 by FDA under an Emergency Use Authorization (EUA). This EUA will remain in effect (meaning this test can be used) for the duration of the COVID-19 declaration under Section 564(b)(1) of the Act, 21 U.S.C. section 360bbb-3(b)(1), unless the authorization is terminated or revoked.  Performed at [facility], [place] [address] [place], [place] [address]   Culture, blood (routine x 2)     Status: None (Preliminary result)   Collection Time: 12/08/2020 12:11 PM  Specimen: BLOOD  Result Value Ref Range Status   Specimen Description   Final    BLOOD PORTA CATH Performed at [facility] [address] [place], [place] [address]    Special Requests   Final    BOTTLES DRAWN AEROBIC AND ANAEROBIC Blood Culture adequate volume Performed at [facility] [address], [place], [place] [address]    Culture   Final    NO GROWTH 3 DAYS Performed at [facility], [place] [address], [place], [place] [address]    Report Status PENDING  Incomplete       Radiology Studies: No results found.   Scheduled Meds:  (feeding supplement) PROSource Plus  30 mL Oral Daily   Chlorhexidine Gluconate Cloth  6 each Topical Daily   cholecalciferol  2,000 Units Oral Daily   dexamethasone  12 mg Oral Q8H   doxazosin  8 mg Oral Daily   feeding supplement  237 mL Oral BID BM   finasteride  5 mg Oral Daily   fluconazole  100 mg Oral Daily   latanoprost  1 drop Left Eye QHS   lidocaine  1 patch Transdermal Q24H   metoprolol tartrate  25 mg Oral BID   multivitamin with minerals  1 tablet Oral Daily   oxyCODONE  20 mg Oral Q8H   pantoprazole  40 mg Oral BID AC   polyethylene glycol  17 g Oral Daily   temazepam  15 mg Oral QHS   timolol  1 drop Both Eyes QHS   cyanocobalamin  1,000 mcg Oral Daily   Continuous Infusions:  sodium chloride 125 mL/hr at 11/21/20 0133     LOS: 2 days    Time spent: 25 minutes   Bonnell Public, MD [facility]  If 7PM-7AM, please contact night-coverage [email or web address] 11/21/2020, 5:23 PM

## 2020-11-21 NOTE — Plan of Care (Signed)

## 2020-11-21 NOTE — Progress Notes (Signed)
Shift Summary:  Remained alert, but forgetful. Family remained at bedside during this shift. Pain control with scheduled medicines. Complained of nausea during this shift, Zofran added to regimen,along with lidocaine patches for hip/ abdominal pain. Poor oral intake noted during this shift. Remains on telemetry, sinus tach in the lower 100's. Remains on room air, dyspnea with exertion noted upon getting up. 1-2 assist to bedside commode, per  family request, only give pertinent medicines since patient oral intake is poor. Vital signs remained stable during this shift, patient fluctuated between green and yellow mews, remains on normal saline at 150ml/hr. No other needs identified. Will continue to monitor.

## 2020-11-21 NOTE — Progress Notes (Signed)
Daily Progress Note   Patient Name: Gary Young       Date: 11/21/2020 DOB: [birth year]  Age: 70 y.o. MRN#: [id number] Attending Physician: Bonnell Public, MD Primary Care Physician: [name], MD Admit Date: 11/21/2020  Reason for Consultation/Follow-up: Establishing goals of care  Subjective: Patient is resting in bed, currently he is asleep.  Towards the end of my rounds, patient opens his eyes and appears to be resting comfortably.  Wife present at bedside.  Patient's twin sister is also present in the room this morning.  We discussed about current pain and on pain symptom management additionally we also discussed about neck steps as far as his overall cancer treatments.  Patient continues with radiation therapy and we are trying to adjust his pain/anxiety symptoms along with concurrent radiation.  Length of Stay: 2  Current Medications: Scheduled Meds:  . (feeding supplement) PROSource Plus  30 mL Oral Daily  . Chlorhexidine Gluconate Cloth  6 each Topical Daily  . cholecalciferol  2,000 Units Oral Daily  . dexamethasone  12 mg Oral Q8H  . doxazosin  8 mg Oral Daily  . feeding supplement  237 mL Oral BID BM  . finasteride  5 mg Oral Daily  . fluconazole  100 mg Oral Daily  . latanoprost  1 drop Left Eye QHS  . lip balm      . metoprolol tartrate  25 mg Oral BID  . multivitamin with minerals  1 tablet Oral Daily  . oxyCODONE  20 mg Oral Q8H  . pantoprazole  40 mg Oral BID AC  . polyethylene glycol  17 g Oral Daily  . temazepam  15 mg Oral QHS  . timolol  1 drop Both Eyes QHS  . cyanocobalamin  1,000 mcg Oral Daily    Continuous Infusions: . sodium chloride 125 mL/hr at 11/21/20 0133    PRN Meds: albuterol, calcium carbonate, HYDROmorphone (DILAUDID) injection,  hydrOXYzine, LORazepam, oxyCODONE, simethicone, sodium chloride flush  Physical Exam         Appears to be resting comfortably Appears to have generalized weakness Regular work of breathing Does not appear to be in distress Abdomen is mildly distended Trace edema  Vital Signs: BP (!) 124/92 (BP Location: Left Arm)   Pulse 96   Temp 97.8 F (36.6 C) (Oral)  Resp 20   Ht 6' (1.829 m)   Wt 105.2 kg   SpO2 93%   BMI 31.46 kg/m  SpO2: SpO2: 93 % O2 Device: O2 Device: Room Air O2 Flow Rate: O2 Flow Rate (L/min): 2 L/min  Intake/output summary:  Intake/Output Summary (Last 24 hours) at 11/21/2020 1220 Last data filed at 11/21/2020 0700 Gross per 24 hour  Intake 3152.51 ml  Output 725 ml  Net 2427.51 ml   LBM: Last BM Date: 11/21/20 Baseline Weight: Weight: 105.2 kg Most recent weight: Weight: 105.2 kg       Palliative Assessment/Data:      Patient Active Problem List   Diagnosis Date Noted  . Weakness   . Urinary retention   . Dehydration 11/19/2020  . Hyponatremia 11/17/2020  . 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 Brain metastases ([facility]) 11/10/2020  . Bone metastasis ([facility]) 11/10/2020  . Liver masses   . Mass in neck   . 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 Malignant melanoma ([facility])   . Goals of care, counseling/discussion   . Palliative care by specialist   . Intractable pain 11/04/2020    Palliative Care Assessment & Plan   Patient Profile:    Assessment: 70 year old gentleman with metastatic melanoma chronic pain iron deficiency anemia presenting with right hip pain and spasms admitted to hospital medicine service for dehydration electrolyte disturbances.  Remains on radiation treatments.  Recommendations/Plan: And is on scheduled as well as as needed opioids.  Medication history noted.  Discussed with the wife at bedside.  Continue current scope of care.  Agree with DNR.  If life review performed.  Patient lives at home with his wife in [place], [place].  Daughter also lives at home currently.  Patient's wife discussed about immunotherapy being the next treatment of choice.  She expressed concern about the patient having chest tightness/anxiety at times.  We discussed frankly but compassionately about the serious nature of patient's illness.  I gave her brief information about how palliative or even hospice services can be of assistance going forward depending on the patient's overall trajectory of illness.  All of her questions addressed to the best of my ability.  Goals of Care and Additional Recommendations: Limitations on Scope of Treatment: Full Scope Treatment  Code Status:    Code Status Orders  (From admission, onward)           Start     Ordered   11/11/2020 1732  Do not attempt resuscitation (DNR)  Continuous       Question Answer Comment  In the event of cardiac or respiratory ARREST Do not call a "code blue"   In the event of cardiac or respiratory ARREST Do not perform Intubation, CPR, defibrillation or ACLS   In the event of cardiac or respiratory ARREST Use medication by any route, position, wound care, and other measures to relive pain and suffering. May use oxygen, suction and manual treatment of airway obstruction as needed for comfort.   Comments DNR confirmed with patient      12/04/2020 1731           Code Status History     Date Active Date Inactive Code Status Order ID Comments User Context   11/04/2020 1830 11/13/2020 2331 DNR [id number]  Kayleen Memos, DO ED   11/04/2020 1728 11/04/2020 1829 Full Code [id number]  Kayleen Memos, DO ED      Advance Directive Documentation    Angelina Most  Recent Value  Type of Advance Directive Healthcare Power of Attorney, Living will  Pre-existing out of facility DNR order (yellow form or pink MOST form) --  "MOST" Form in Place? --       Prognosis:  Unable to determine  Discharge Planning: To Be Determined  Care plan was discussed with  patient's  twin sister as well as his wife who is in the room this morning.   Thank you for allowing the Palliative Medicine Team to assist in the care of this patient.   Time In: 11 Time Out: 11.25 Total Time 25 Prolonged Time Billed  no       Greater than 50%  of this time was spent counseling and coordinating care related to the above assessment and plan.  Gary Chance, MD  Please contact Palliative Medicine Team phone at [phone] for questions and concerns.

## 2020-11-23 ENCOUNTER — Ambulatory Visit: Payer: Medicare HMO

## 2020-11-23 LAB — CULTURE, BLOOD (ROUTINE X 2)
Culture: NO GROWTH
Culture: NO GROWTH
Special Requests: ADEQUATE
Special Requests: ADEQUATE

## 2020-11-24 ENCOUNTER — Ambulatory Visit: Payer: Medicare HMO

## 2020-11-24 ENCOUNTER — Encounter: Payer: Self-pay | Admitting: *Deleted

## 2020-11-24 NOTE — Progress Notes (Signed)
Oncology Nurse Navigator Documentation  Oncology Nurse Navigator Flowsheets 11/24/2020  Abnormal Finding Date -  Confirmed Diagnosis Date -  Diagnosis Status -  Phase of Treatment -  Radiation Actual Start Date: -  Navigator Follow Up Date: -  Navigator Follow Up Reason: -  Navigation Complete Date: 11/24/2020  Post Navigation: Continue to Follow Patient? No  Reason Not Navigating Patient: Hospice/Death  Navigator Location [facility]  Referral Date to RadOnc/MedOnc -  Navigator Encounter Type Appt/Treatment Plan Review  Telephone -  Treatment Initiated Date -  Patient Visit Type -  Treatment Phase -  Barriers/Navigation Needs -  Education -  Interventions -  Acuity -  Coordination of Care -  Education Method -  Support Groups/Services -  Time Spent with Patient 15

## 2020-11-25 ENCOUNTER — Ambulatory Visit: Payer: Medicare HMO

## 2020-12-11 NOTE — Progress Notes (Addendum)
87 Message left for wife Gary Young regarding patient status declined. No answer. Left message to call back for details.   0407 Second Attempt made to contact phone. Phone call straight to voicemail.  Will continue try again.  4720 looked through chart for additional phone numbers. All phone numbers attempted. Unable to get hold of next of kin at this time. Order obtained to pronounce. Pronounced by 2 RN at 0344. Will continue to attempt to call number provided for wife.   7218 attempted to call Gary Young. Call straight to voicemail. Message left to return call.   0633 Attempted to call wife Gary Young. Message left to call back. Charge RN made aware  [phone] post mortem care given. Family arrived prior to sending patient to [facility]. Patients sister Gary Young at bedside with patient.Making calls to family

## 2020-12-11 NOTE — Discharge Summary (Signed)
Physician Discharge Summary  Patient ID: Gary Young MRN: [id number] DOB/AGE: January 02, 1951 69 y.o.  Admit date: Dec 13, 2020 Died on: 12/17/20 at 0344 hours   Death discharge Diagnoses:  Metastatic melanoma.    Hospital Course: Patient was a 70 year old Caucasian male with medical history significant for metastatic melanoma, chronic pain and anemia.  Patient was admitted to the hospital with right hip pain, volume depletion, electrolyte abnormalities, urinary retention and encephalopathy.  Patient was also noted to be weak.  Patient was managed expectantly.  Radiotherapy was continued during the hospital stay.  Palliative care team was consulted to assist with patient's management during the hospital stay.  Patient continued to decline and passed on on 2020-12-17 at 0344 hours.  Consults: hematology/oncology and palliative care team  Signed: Bonnell Public 11/23/2020, 4:32 AM

## 2020-12-11 DEATH — deceased

## 2021-01-06 ENCOUNTER — Ambulatory Visit: Payer: Self-pay | Admitting: Urology

## 2021-03-20 NOTE — Progress Notes (Signed)
  Radiation Oncology         [phone] ________________________________  Name: Gary Young MRN: [id number]  Date: 11/20/2020  DOB: 1950/09/24  End of Treatment Note  Diagnosis:   70 yo man with painful right hip metastasis and brain metastases with history of melanoma - Stage IV     Indication for treatment:  Palliation       Radiation treatment dates:   6/1-11/11/20  Site/dose:   The whole brain was treated to 21 Gy in 7 fractions The right hip was treated to 21 Gy in 7 fractions  Beams/energy:    The whole brain was treated using lateralized beams with 6 MV The right hip was treated with a 3-field set-up with 6 MV  Narrative: The patient tolerated radiation treatment relatively well, but, his condition worsened during treatment and it was discontinued.  Plan: The patient has completed radiation treatment.  ________________________________  Sheral Apley. Tammi Klippel, M.D.
